# Patient Record
Sex: Male | Born: 1950
Health system: Southern US, Community
[De-identification: ages and names within clinical notes are randomized; demographics above are authoritative.]

## PROBLEM LIST (undated history)

## (undated) DIAGNOSIS — N189 Chronic kidney disease, unspecified: Secondary | ICD-10-CM

## (undated) DIAGNOSIS — I4891 Unspecified atrial fibrillation: Secondary | ICD-10-CM

## (undated) DIAGNOSIS — F32A Depression, unspecified: Secondary | ICD-10-CM

## (undated) DIAGNOSIS — M069 Rheumatoid arthritis, unspecified: Secondary | ICD-10-CM

## (undated) DIAGNOSIS — G250 Essential tremor: Secondary | ICD-10-CM

## (undated) DIAGNOSIS — E785 Hyperlipidemia, unspecified: Secondary | ICD-10-CM

## (undated) DIAGNOSIS — R002 Palpitations: Secondary | ICD-10-CM

## (undated) DIAGNOSIS — G4730 Sleep apnea, unspecified: Secondary | ICD-10-CM

## (undated) DIAGNOSIS — R7989 Other specified abnormal findings of blood chemistry: Secondary | ICD-10-CM

## (undated) DIAGNOSIS — I10 Essential (primary) hypertension: Secondary | ICD-10-CM

## (undated) DIAGNOSIS — I2510 Atherosclerotic heart disease of native coronary artery without angina pectoris: Secondary | ICD-10-CM

## (undated) DIAGNOSIS — F419 Anxiety disorder, unspecified: Secondary | ICD-10-CM

## (undated) DIAGNOSIS — C61 Malignant neoplasm of prostate: Secondary | ICD-10-CM

## (undated) DIAGNOSIS — M1990 Unspecified osteoarthritis, unspecified site: Secondary | ICD-10-CM

## (undated) DIAGNOSIS — F329 Major depressive disorder, single episode, unspecified: Secondary | ICD-10-CM

## (undated) DIAGNOSIS — K219 Gastro-esophageal reflux disease without esophagitis: Secondary | ICD-10-CM

## (undated) DIAGNOSIS — Z87442 Personal history of urinary calculi: Secondary | ICD-10-CM

## (undated) HISTORY — PX: TONSILLECTOMY: SUR1361

## (undated) HISTORY — DX: Rheumatoid arthritis, unspecified: M06.9

## (undated) HISTORY — DX: Atherosclerotic heart disease of native coronary artery without angina pectoris: I25.10

## (undated) HISTORY — DX: Hyperlipidemia, unspecified: E78.5

## (undated) HISTORY — DX: Unspecified atrial fibrillation: I48.91

## (undated) HISTORY — DX: Essential (primary) hypertension: I10

## (undated) HISTORY — DX: Depression, unspecified: F32.A

## (undated) HISTORY — DX: Major depressive disorder, single episode, unspecified: F32.9

## (undated) HISTORY — DX: Palpitations: R00.2

## (undated) HISTORY — PX: KIDNEY STONE SURGERY: SHX686

## (undated) HISTORY — PX: LITHOTRIPSY: SUR834

## (undated) HISTORY — DX: Other specified abnormal findings of blood chemistry: R79.89

---

## 1999-09-26 ENCOUNTER — Inpatient Hospital Stay (HOSPITAL_COMMUNITY): Admission: AD | Admit: 1999-09-26 | Discharge: 1999-09-27 | Payer: Self-pay | Admitting: Cardiovascular Disease

## 2000-03-10 ENCOUNTER — Ambulatory Visit (HOSPITAL_COMMUNITY): Admission: RE | Admit: 2000-03-10 | Discharge: 2000-03-10 | Payer: Self-pay | Admitting: *Deleted

## 2000-03-17 ENCOUNTER — Encounter: Payer: Self-pay | Admitting: Orthopedic Surgery

## 2000-03-17 ENCOUNTER — Ambulatory Visit (HOSPITAL_COMMUNITY): Admission: RE | Admit: 2000-03-17 | Discharge: 2000-03-17 | Payer: Self-pay | Admitting: Orthopedic Surgery

## 2000-04-01 ENCOUNTER — Encounter: Payer: Self-pay | Admitting: Orthopedic Surgery

## 2000-04-01 ENCOUNTER — Ambulatory Visit (HOSPITAL_COMMUNITY): Admission: RE | Admit: 2000-04-01 | Discharge: 2000-04-01 | Payer: Self-pay | Admitting: Orthopedic Surgery

## 2000-04-16 ENCOUNTER — Ambulatory Visit (HOSPITAL_COMMUNITY): Admission: RE | Admit: 2000-04-16 | Discharge: 2000-04-16 | Payer: Self-pay | Admitting: Orthopedic Surgery

## 2000-04-16 ENCOUNTER — Encounter: Payer: Self-pay | Admitting: Orthopedic Surgery

## 2002-10-17 ENCOUNTER — Encounter: Payer: Self-pay | Admitting: Orthopedic Surgery

## 2002-10-17 ENCOUNTER — Inpatient Hospital Stay (HOSPITAL_COMMUNITY): Admission: EM | Admit: 2002-10-17 | Discharge: 2002-10-22 | Payer: Self-pay | Admitting: Orthopedic Surgery

## 2002-10-19 ENCOUNTER — Encounter: Payer: Self-pay | Admitting: Orthopedic Surgery

## 2002-10-20 ENCOUNTER — Encounter: Payer: Self-pay | Admitting: Orthopedic Surgery

## 2002-10-21 ENCOUNTER — Encounter: Admission: RE | Admit: 2002-10-21 | Discharge: 2002-10-21 | Payer: Self-pay | Admitting: Orthopedic Surgery

## 2002-10-21 ENCOUNTER — Encounter: Payer: Self-pay | Admitting: Orthopedic Surgery

## 2002-11-10 ENCOUNTER — Inpatient Hospital Stay (HOSPITAL_COMMUNITY): Admission: RE | Admit: 2002-11-10 | Discharge: 2002-11-11 | Payer: Self-pay | Admitting: Orthopaedic Surgery

## 2002-11-10 ENCOUNTER — Encounter: Payer: Self-pay | Admitting: Orthopaedic Surgery

## 2003-12-23 HISTORY — PX: BACK SURGERY: SHX140

## 2003-12-23 HISTORY — PX: CERVICAL FUSION: SHX112

## 2004-05-13 ENCOUNTER — Inpatient Hospital Stay (HOSPITAL_COMMUNITY): Admission: EM | Admit: 2004-05-13 | Discharge: 2004-05-14 | Payer: Self-pay | Admitting: Emergency Medicine

## 2004-05-14 HISTORY — PX: CARDIAC CATHETERIZATION: SHX172

## 2005-12-24 ENCOUNTER — Inpatient Hospital Stay (HOSPITAL_COMMUNITY): Admission: RE | Admit: 2005-12-24 | Discharge: 2005-12-28 | Payer: Self-pay | Admitting: Orthopaedic Surgery

## 2006-09-18 ENCOUNTER — Ambulatory Visit (HOSPITAL_COMMUNITY): Admission: RE | Admit: 2006-09-18 | Discharge: 2006-09-18 | Payer: Self-pay | Admitting: *Deleted

## 2006-12-21 ENCOUNTER — Encounter: Admission: RE | Admit: 2006-12-21 | Discharge: 2006-12-21 | Payer: Self-pay | Admitting: Orthopaedic Surgery

## 2007-02-10 ENCOUNTER — Encounter: Admission: RE | Admit: 2007-02-10 | Discharge: 2007-02-10 | Payer: Self-pay | Admitting: Orthopaedic Surgery

## 2007-04-16 ENCOUNTER — Encounter: Admission: RE | Admit: 2007-04-16 | Discharge: 2007-04-16 | Payer: Self-pay | Admitting: Orthopaedic Surgery

## 2007-10-04 ENCOUNTER — Ambulatory Visit (HOSPITAL_BASED_OUTPATIENT_CLINIC_OR_DEPARTMENT_OTHER): Admission: RE | Admit: 2007-10-04 | Discharge: 2007-10-04 | Payer: Self-pay | Admitting: Urology

## 2008-03-29 HISTORY — PX: TRANSTHORACIC ECHOCARDIOGRAM: SHX275

## 2008-12-22 HISTORY — PX: BACK SURGERY: SHX140

## 2009-09-19 HISTORY — PX: CARDIOVASCULAR STRESS TEST: SHX262

## 2011-01-12 ENCOUNTER — Encounter: Payer: Self-pay | Admitting: Orthopaedic Surgery

## 2011-03-07 ENCOUNTER — Ambulatory Visit: Payer: Self-pay | Admitting: Urology

## 2011-05-06 NOTE — Op Note (Signed)
NAME:  Nathan Mcclure, Nathan Mcclure              ACCOUNT NO.:  [id number]   MEDICAL RECORD NO.:  [id number]          PATIENT TYPE:  AMB   LOCATION:  [facility]                         FACILITY:  [facility]   PHYSICIAN:  Excell Seltzer. Annabell Howells, M.D.    DATE OF BIRTH:  [birth year]   DATE OF PROCEDURE:  10/04/2007  DATE OF DISCHARGE:                               OPERATIVE REPORT   REDICTATION:   PROCEDURE:  1. Cystoscopy.  2. Left ureteroscopic stone extraction with holmium lasertripsy and      insertion of left double-J stent.   PREOPERATIVE DIAGNOSIS:  Left mid ureteral stone.   POSTOPERATIVE DIAGNOSIS:  Left mid ureteral stone.   SURGEON:  Bjorn Pippin, MD   ANESTHESIA:  General.   SPECIMEN:  Stone fragments.   DRAIN:  A 6-French x 26-cm double-J stent.   COMPLICATIONS:  None.   INDICATIONS:  [name] is a 60 year old white male with a left mid  ureteral stone; he has elected ureteroscopy.   FINDINGS AND PROCEDURE:  The patient is given Cipro.  He was taken to  the operating room, where a general anesthetic was induced.  He was  placed in the lithotomy position.  His perineum and genitalia were  prepped with Betadine solution and he was draped in the usual sterile  fashion.  Cystoscopy was performed using a 22-French scope and 12- and  70-degrees lenses.  Examination revealed a normal urethra.  The prostate  had trilobar hyperplasia with some obstruction.  The prostatic urethra  was fairly short.  Examination of bladder revealed mild trabeculation,  no tumor, stones or inflammation were noted.  The ureteral orifices were  unremarkable.   The left ureteral was orifice was cannulated with a guidewire, which was  passed by the stone to the kidney.  A 12-French introducer sheath  dilator was passed over the wire to dilate the distal ureter and a 6-  French short ureteroscope was then passed alongside the wire up the  ureter.  At the vessels, I had to pass a wire through the ureteroscope  to negotiate it  over the vessels and once this was done, the stone could  be seen.  It was then engaged with a holmium laser using the 270 micron  fiber on 5 joules and 8 MHz.  The stone fragmented easily and was then  removed with a nitinol basket.  Once all fragments had been removed to  the bladder, the ureteroscope was removed.  The cystoscope was  reinserted over the wire and a 6-French 26-cm double-J stent with string  was passed over the wire to the kidney under fluoroscopic guidance.  The  wire was removed, leaving a good coil in the kidney, a good coil in the  bladder.  The bladder was then evacuated free of stone fragments.  The  patient was taken down from the  lithotomy position.  The stent string was secured to his penis.  His  anesthetic was reversed.  He was moved to the recovery room in stable  condition.  There no complications.  The stone fragments were given to  the family  and they will return them to my office for analysis.      Excell Seltzer. Annabell Howells, M.D.  Electronically Signed     JJW/MEDQ  D:  10/05/2007  T:  10/06/2007  Job:  [id number]

## 2011-05-06 NOTE — Op Note (Signed)
NAME:  Nathan Mcclure, Nathan Mcclure              ACCOUNT NO.:  [id number]   MEDICAL RECORD NO.:  [id number]          PATIENT TYPE:  AMB   LOCATION:  [facility]                         FACILITY:  [facility]   PHYSICIAN:  Excell Seltzer. Annabell Howells, M.D.    DATE OF BIRTH:  1951-06-20   DATE OF PROCEDURE:  10/04/2007  DATE OF DISCHARGE:                               OPERATIVE REPORT   PROCEDURE:  Cystoscopy, left ureteroscopic stone extraction with Holmium  laser lithotripsy and insertion of left double-J stent.   PREOPERATIVE DIAGNOSIS:  Left mid-ureteral stent.   POSTOPERATIVE DIAGNOSIS:  Left mid-ureteral stent.   SURGEON:  Excell Seltzer. Annabell Howells, M.D.   ANESTHESIA:  General anesthesia.   DRAINS:  A 6-French x 26 cm double-J stent.   SPECIMENS:  Stone fragments.   COMPLICATIONS:  None.   INDICATIONS FOR PROCEDURE:  Mr. Horen is a 60 year old white male with  a 6 mm left mid-ureteral stent that __________   DESCRIPTION OF PROCEDURE/FINDINGS:  He was given Cipro.  He was taken to  the operating room where a general anesthetic was induced.  He was  placed in the lithotomy position.  His perineum and genitalia were  prepped with Betadine solution and he was draped in the usual sterile  fashion.  A cystoscopy was performed using a 22-French scope and 12 cm  degree lenses.  Examination revealed a normal urethra.  The external   Dictation ended at this point.      Excell Seltzer. Annabell Howells, M.D.     JJW/MEDQ  D:  10/04/2007  T:  10/05/2007  Job:  [id number]

## 2011-05-06 NOTE — Op Note (Signed)
NAME:  Nathan Mcclure, Nathan Mcclure              ACCOUNT NO.:  [id number]   MEDICAL RECORD NO.:  [id number]          PATIENT TYPE:  AMB   LOCATION:  [facility]                         FACILITY:  [facility]   PHYSICIAN:  Excell Seltzer. Annabell Howells, M.D.    DATE OF BIRTH:  1951/02/25   DATE OF PROCEDURE:  10/04/2007  DATE OF DISCHARGE:                               OPERATIVE REPORT   PROCEDURE:  1. Cystoscopy.  2. Left ureteroscopic stone extraction with holmium lasertripsy.  3. Insertion of left double-J stent.   PREOPERATIVE DIAGNOSIS:  Left mid ureteral stone.   POSTOPERATIVE DIAGNOSIS:  Left mid ureteral stone.   SURGEON:  Dr. Bjorn Pippin   ANESTHESIA:  General.   SPECIMENS:  Stone fragments.   DRAINS:  6 French x 26 cm double-J stent.   COMPLICATIONS:  None.   INDICATIONS:  Mr. [name] is a 60 year old white male with a 6 mm left  mid ureteral stone that has not progressed.  He has elected  ureteroscopic stone extraction.   FINDINGS AT PROCEDURE:  The patient was taken to the operating room  where general anesthetic was induced.  He was given Cipro.   Dictation ended at this point.      Excell Seltzer. Annabell Howells, M.D.  Electronically Signed     JJW/MEDQ  D:  10/04/2007  T:  10/05/2007  Job:  [id number]

## 2011-05-09 NOTE — H&P (Signed)
NAME:  Nathan Mcclure, Nathan Mcclure NO.:  [id number]   MEDICAL RECORD NO.:  [id number]          PATIENT TYPE:  INP   LOCATION:  NA                           FACILITY:  [facility]   PHYSICIAN:  Sharolyn Douglas, M.D.        DATE OF BIRTH:  Oct 31, 1951   DATE OF ADMISSION:  12/24/2005  DATE OF DISCHARGE:                                HISTORY & PHYSICAL   NOTATION:  This history and physical was performed in our office on December 18, 2005.   CHIEF COMPLAINT:  Pain in my back and legs.   HISTORY OF PRESENT ILLNESS:  His 60 year old white male is seen by [place] for  continuing progressive problems concerning pain in his back and lower  extremities, worse on the right than on the left.  He has had increasing  pain over the last several months, to the point now where he has difficulty  moving about, and certainly is made worse by standing or walking.  This is a  very active young gentleman, and this pain has markedly interfered with his  day to day activities.  He has had a run of epidural steroid injections by  Dr. Caralyn Guile. Ramos, but unfortunately this has not helped him as far as  his overall discomfort is concerned.  An MRI showed lateral recess stenosis  on the right L4-5 with a left-sided foraminal narrowing at L5-S1.  This  patient is quite exasperated with his overall pain and discomfort and after  much discussion, including the risks and benefits of surgery, he is being  admitted for a posterior spinal fusion at L4 to S1.   PAST MEDICAL HISTORY:  1.  The patient has been cleared preoperatively and has undergone a heart      clearance by Eber Hong, P.A.-C. with Dr. Nicki Guadalajara.  He      does have hypertension.  2.  Reflux.  3.  Benign prostatic hypertrophy.   ALLERGIES:  PENICILLIN.   CURRENT MEDICATIONS:  1.  Atenolol.  2.  Prevacid.  3.  Altace.  4.  Caduet.  5.  Flomax.  6.  Allegra.   PAST SURGICAL HISTORY:  Cervical diskectomy and fusion in 2004, by Dr.  Sharolyn Douglas.   FAMILY HISTORY:  Positive for diabetes in his mother.   SOCIAL HISTORY:  The patient is married.  He is employed in [employer].  He  quit smoking 18 months ago.  Alcoholic beverages:  Three to four per week.  He has two children.  He has a two-story home with one flight of stairs.   REVIEW OF SYSTEMS:  CNS:  No seizures, paralysis or double vision, but the  patient does have radiculitis in the lower extremities as mentioned above.  CARDIOVASCULAR:  No chest pain, no aneurysm, no orthopnea.  GASTROINTESTINAL:  No nausea, vomiting, melena or bloody stools.  GENITOURINARY:  No discharge, dysuria, hematuria.  MUSCULOSKELETAL:  Primarily in the history of present illness.   PHYSICAL EXAMINATION:  GENERAL:  Alert, cooperative and friendly, somewhat  anxious 60 year old who changes position frequently during the 30+  minute  office visit, as well as the examination.  VITAL SIGNS:  Blood pressure 140/78, pulse 62, respirations 12.  HEENT:  Normocephalic.  Pupils equal, round, reactive to light and  accommodation.  EOM's intact.  Oropharynx clear.  CHEST:  Clear to auscultation with no rhonchi, no rales, no wheezes.  HEART:  Regular rate and rhythm.  No murmurs are heard.  ABDOMEN:  Soft, nontender.  Liver and spleen not felt.  GENITOURINARY/RECTAL:  Not done, not pertinent to the present illness.  EXTREMITIES/NEUROLOGIC:  Negative for straight leg raise bilaterally.  Some  sensory deficit, more so on the right than on the left in the L5-S1 nerve  root distribution.  Negative clonus.  Strength is normal.  Deep tendon  reflexes 2+ and equal.   ADMISSION DIAGNOSES:  1.  L4 to S1 degenerative disk disease with stenosis.  2.  Hypertension.  3.  Reflux.  4.  Benign prostatic hypertrophy.   PLAN:  The patient will undergo an L4 to S1 posterior spinal fusion with  laminectomy, utilizing pedicle screws, trans-foraminal lumbar interbody  fusion with allograft and bone morphogenic  protein.      Dooley L. Cherlynn June.      Sharolyn Douglas, M.D.  Electronically Signed    DLU/MEDQ  D:  12/19/2005  T:  12/19/2005  Job:  [id number]

## 2011-05-09 NOTE — Op Note (Signed)
NAME:  Nathan Mcclure, Nathan Mcclure                         ACCOUNT NO.:  [id number]   MEDICAL RECORD NO.:  [id number]                   PATIENT TYPE:  INP   LOCATION:  2550                                 FACILITY:  [facility]   PHYSICIAN:  Sharolyn Douglas, M.D.                     DATE OF BIRTH:  [birth year]   DATE OF PROCEDURE:  11/10/2002  DATE OF DISCHARGE:                                 OPERATIVE REPORT   DIAGNOSIS:  Right C7 radiculopathy secondary to spondylosis and herniated  nucleus pulposus.   PROCEDURES:  1. Anterior cervical diskectomy, C6-7 with decompression of the spinal cord     and nerve roots bilaterally.  2. Anterior cervical arthrodesis with placement of an 8 mm VG2C allograft     prosthesis spacer.  3. Anterior cervical instrumentation utilizing the EBI VueLok plate Z6-1.  4. Neuromonitoring utilizing SSEP's.   SURGEON:  Patricia Nettle, M.D.   ASSISTANT:  Colleen Mahar, P.A.-C.   ANESTHESIA:  General endotracheal.   COMPLICATIONS:  None.   INDICATIONS:  The patient is a 60 year old, male with a one month history of  progressively worsening neck and right upper extremity pain.  He was  initially seen by Windy Fast A. Gioffre, M.D. and had such extreme pain in his  neck and arm with associated weakness that he required admission to the  hospital and narcotic pain medications intravenously.  He had to be sedated  in order to obtain an MRI scan which showed spondylosis at C6-7 with  foraminal disk herniation on the right.  His physical examination was  consistent with a right C7 radiculopathy.  He had a right C7 selective nerve  root injection which gave him some improvement in his symptoms.  Unfortunately, he has not regained any strength and has persistent numbness  and pain radiating in a C7 distribution on the right side.  Risks, benefits,  and alternatives of surgery were reviewed in detail with the patient, and he  elected to proceed.   DESCRIPTION OF PROCEDURE:  The patient  was properly identified in the  holding area and taken to the operating room.  He underwent general  endotracheal anesthesia without difficulty.  Neuromonitoring was established  in the form of SSEP's.  He was carefully positioned on the operating room  table with the horseshoe head rest and his neck in slight hyperextension.  Five pounds of Holter traction was hung.  The neck was prepped and draped in  the usual sterile fashion.  His shoulders were gently taped down to allow  for adequate x-ray visualization of the C6-7 interspace.  A 3 cm transverse  incision was then made at the level of the cricoid cartilage transversely on  the left side of the neck.  Previous to the injection, 10 cc of 1% lidocaine  with epinephrine was injected subcutaneously.  Dissection was carried down  sharply through the platysma.  The interval between the sternocleidomastoid  muscle and strap muscles medially was identified and developed down to the  prevertebral space.  The esophagus, trachea, and carotid sheath were  identified and protected at all times.  The C6-7 disk space was identified  by anterior osteophytic overgrowth.  A spinal needle was placed in the level  above at C5-6 in order to allow for x-ray visualization.  Intraoperative x-  ray confirmed our needle was in C5-6.  We then elevated the longus colli  muscle over the C6-7 disk space bilaterally.  Care was taken to protect the  disk above and below.  We placed our deep retractors.  Leksell rongeur was  then used to remove the large anterior osteophytes.  We placed Caspar  distraction pins, and gentle distraction was applied.  Curettes and  pituitary rongeurs were used to perform a radical diskectomy back to the  PLL.  The disk material was degenerative.  We found large uncovertebral  spurs, right greater than left.  A high-speed bur was then used to remove  the cartilaginous endplates as well as to take down the large uncovertebral  spur on the  right side.  The surgical microscope was draped, brought into  the field.  The rest of the case was done under direct microscopic  illumination and magnification.  We used micro Kerrisons and curettes to  completely take down the uncovertebral spur on the right side.  We  identified the C7 pedicle with a nerve hook.  We performed a foraminotomy  out the C6-7 foramen.  We identified the C7 nerve root as it was coming back  and out the foramen.  We confirmed that it was completely free all the way  out the foramen with a blunt probe.  We performed a similar decompression on  the left side, although the spur was not nearly as large.  We took down the  PLL on the right side and confirmed that there were no free fragments of  disk by sweeping with a blunt probe.  The wound was copiously irrigated.  We  then placed an 8 mm VG2C allograft prosthesis spacer within the interspace.  The distraction was removed.  Weight was removed from the head, so we had an  excellent interference fit.  We then placed a 20 mm EBI VueLok plate with  four 14 mm screws.  We engaged the locking mechanism.  We took  intraoperative x-rays for good position of the bone plug in plate.  We then  irrigated again.  All bleeding was controlled with Gelfoam, bipolar  electrocautery.  Platysma was closed with an interrupted 2-0 Vicryl suture.  We placed a deep Penrose drain.  Subcutaneous layer closed with 3-0 and then  subcuticular layer closed with 4-0 Vicryl.  Benzoin and Steri-Strips were  placed, sterile dressing applied.  The patient was placed into an Aspen  collar, extubated without difficulty, transferred to the recovery room in  stable condition, able to move his upper and lower extremities.  There were  no dilatoris changes in the SSEP monitoring throughout the procedure.                                               Sharolyn Douglas, M.D.   MC/MEDQ  D:  11/10/2002  T:  11/10/2002  Job:  [id number]

## 2011-05-09 NOTE — Discharge Summary (Signed)
NAME:  Nathan Mcclure, Nathan Mcclure                         ACCOUNT NO.:  [id number]   MEDICAL RECORD NO.:  [id number]                   PATIENT TYPE:  INP   LOCATION:  0343                                 [facility]ACILITY[facility]  [facility]   PHYSICIAN:  Sharolyn Douglas, M.D.                     DATE OF BIRTH:  1951/05/07   DATE OF ADMISSION:  10/17/2002  DATE OF DISCHARGE:  10/22/2002                                 DISCHARGE SUMMARY   ADMISSION DIAGNOSES:  1. Neck pain and right arm pain.  2. Hypertension.  3. Gastrointestinal reflux disease.  4. Coronary artery disease.  5. Kidney stones.   DISCHARGE DIAGNOSES:  1. Herniated nucleus pulposis, C6-7.  2. Status post selective nerve root block at C7 on the right with improved     pain.  3. Hypertension.  4. Gastrointestinal reflux disease.  5. Coronary artery disease.  6. Kidney stones.   CONSULTS:  None.   PROCEDURES:  Selective nerve root block on the right at C7 with improved  pain.  This was done by [facility].   HISTORY OF PRESENT ILLNESS:  The patient is a 60 year old male who has been  having progressive increase in pain in his neck, as well as in his right  upper extremity.  The is going all the way down his arm into his forearm and  into his hand.  He has been complaining of pain in his right triceps and  significant pain in his posterior arm and right forearm.  He also complains  of numbness with extension to his right arm and hand.  He denies any lower  extremity symptoms.  He denies any bladder or bowel incontinence, fever,  chills, or sweats.  He was admitted for pain control by Georges Lynch. [name],  M.D.  Secondary to the findings on MRI, Sharolyn Douglas, M.D., was consulted for  evaluation of herniated C6-7 disk.   LABORATORY DATA:  On October 17, 2002, the CBC was within normal limits with  the exception of a white count of 11.7.  The PT, INR, and PTT were within  normal limits.  The comprehensive metabolic panel was within normal  limits  with the exception of a BUN of 36.  The UA was negative.  The EKG from  October 18, 2002, showed sinus bradycardia with a rate of 41.  X-ray of the  cervical spinal spine on October 19, 2002, showed C6-7 mild disk  degeneration and bone spur formation with mild bilateral bony neuroforaminal  narrowing.  MRI did show an HNP at C6-7 with neuroforaminal narrowing more  significant on the right.   HOSPITAL COURSE:  On October 17, 2002, the patient was admitted for neck  pain and right upper extremity pain, which was severe and not responding to  p.o. pain medications.  An MRI was ordered for the following day, which did  show the above C6-7  herniated disk.  Sharolyn Douglas, M.D., was consulted for  evaluation.  We did discuss with the patient the possibility of removing  this disk versus attempting a nerve root block.  He opted to do a selective  nerve root block and attempt to alleviate symptoms and possibly get rid of  this problem.  We did discuss it likely would return, however, the patient  was interested in giving it a try and if it did not give him any significant  relief of symptoms he will follow up with [place] as an outpatient and consider  having the surgery at that point.  He was taken for selective nerve root  block on October 21, 2002.  He completed this without any complications or  problems.  By the following day, his pain had improved to the point that he  felt like he would go home.  It was not completely resolved at this point,  however, he was neurovascularly intact.  He did have some weakness  throughout his entire hospital stay in his right triceps, as well as wrist  extensors.  This did not change post injection.  Care management was  consulted to help [place] arrange any home needs for this patient.  By October 22, 2002, the patient's pain was relatively well controlled.  He was stable  and ready for discharge with discharge plan for status post right C7  selective nerve  root block with improved pain.   ACTIVITY:  Progressive ambulation.   DISCHARGE MEDICATIONS:  Continue with pain medications as needed.   FOLLOW-UP:  Follow up with Sharolyn Douglas, M.D., the following week.  He was  instructed to call for an appointment.   DIET:  Regular home diet as tolerated.   CONDITION ON DISCHARGE:  Improved.   DISPOSITION:  The patient is being discharged to his home.     [name], P.A.                       Sharolyn Douglas, M.D.    CM/MEDQ  D:  11/21/2002  T:  11/21/2002  Job:  [id number]

## 2011-05-09 NOTE — Op Note (Signed)
NAME:  Nathan Mcclure, Nathan Mcclure              ACCOUNT NO.:  [id number]   MEDICAL RECORD NO.:  [id number]          PATIENT TYPE:  INP   LOCATION:  5023                         FACILITY:  [facility]   PHYSICIAN:  Sharolyn Douglas, M.D.        DATE OF BIRTH:  December 29, 1950   DATE OF PROCEDURE:  12/24/2005  DATE OF DISCHARGE:                                 OPERATIVE REPORT   PREOPERATIVE DIAGNOSIS:  L5-S1 degenerative disk disease, chronic back and  bilateral leg pain.   PROCEDURE:  1.  L5-S1 left hemilaminectomy with decompression of the left L5-S1 nerve      roots.  2.  Transforaminal lumbar interbody fusion L5-S1 with placement of 7 mm peak      cage.  3.  Pedicle screw instrumentation L5-S1 using the Abbott spine system.  4.  Posterior spinal arthrodesis L5-S.  5.  Local autogenous bone graft supplemented with bone morphogenic protein.   SURGEON:  Sharolyn Douglas, M.D.   ASSISTANT:  [name], P.A.   ANESTHESIA:  General endotracheal.   ESTIMATED BLOOD LOSS:  200 mL.   COMPLICATIONS:  None.   INDICATIONS:  The patient is a pleasant 60 year old male with chronic  progressive back and bilateral leg pain. His imaging studies demonstrate  multilevel degenerative changes, most advanced at L5-S1 with near-complete  disk space collapse and Modic endplate changes.  He failed to respond to all  conservative treatment options and now elected to undergo L5-S1  decompression and fusion in hopes of improving his symptoms.  Risks,  benefits, alternatives reviewed.   DESCRIPTION OF PROCEDURE:  The patient was identified in the holding area,  taken to the operating room.  He underwent general endotracheal anesthesia  without difficulty and given prophylactic IV antibiotics.  Carefully  positioned prone on the Wilson frame.  Neuro monitoring was established in  the form of SSEPs and lower extremity EMGs.  The back was prepped and  draped in the usual sterile fashion.  A midline incision was made from L4  down to  the sacrum.  Dissection carried sharply through deep fascia.  Subperiosteal exposure carried out to the tips the transverse process of L5  and the sacral ala bilaterally.  Deep retractors were placed.  Intraoperative x-ray taken to confirm levels.  We then turned our attention  to placing pedicle screws at L5-S1 using anatomic probing technique.  We  utilized 6.5 x 50 mm screws in L5 and 7.5 by a 40 mm screws in the sacrum  bilaterally.  We had good screw purchase.  We were unable to stimulate the  pedicle screws because of a technical problem with the neuro monitoring.  We  did palpate the pedicles, and there were no breeches.  We then turned our  attention to performing a laminectomy at L5-S1 on the left side.  The  interspinous ligament was removed.  A hemilaminectomy was completed.  The  ligamentum of flavum was removed piecemeal. The L5-S1 nerve roots were  identified and decompressed.  We then turned our attention to forming a  transforaminal lumbar interbody fusion.  The remaining facet joint  was  osteotomized.  The exiting and transversing nerve roots were identified and  protected all times.  The disk space was covered by a large osteophyte.  This was removed with an osteotome.  We were then able to enter the disk  space and gradually dilated up to 7 mm.  The disk material was extremely  degenerative. We scraped the cartilaginous endplates clean.  We then packed  the disk space with BMP sponges and local bone graft obtained from the  laminectomy. A 7-mm peak cage was then packed with a BMP sponge inserted  into the interspace and carefully tamped anteriorly and across the midline.  We then completed the posterior spinal arthrodesis by decorticating the  transverse process of L5, the sacral ala bilaterally.  The remaining local  bone graft was packed into lateral gutters.  The 50 mm short segment  titanium rods were placed into the polyaxial screw heads. Nathan Mcclure compression  was  applied before shearing off the locking caps.  The wound was irrigated.  Hemostasis was achieved.  Deep Hemovac drain left in place.  The deep fascia  closed with a running #1 Vicryl suture.  Subcutaneous layer closed with 2-0  Vicryl suture followed by a running 3-0 subcuticular Vicryl suture on the  skin edges,.  Benzoin and Steri-Strips placed. Sterile dressing applied.  Patient was turned supine, extubated without difficulty and transferred to  recovery in stable condition, able to move his upper and lower extremities.      Sharolyn Douglas, M.D.  Electronically Signed     MC/MEDQ  D:  12/24/2005  T:  12/25/2005  Job:  [id number]

## 2011-05-09 NOTE — Cardiovascular Report (Signed)
NAME:  Nathan Mcclure, Nathan Mcclure                         ACCOUNT NO.:  [id number]   MEDICAL RECORD NO.:  [id number]                   PATIENT TYPE:  INP   LOCATION:  2030                                 FACILITY:  [facility]   PHYSICIAN:  Cristy Hilts. Jacinto Halim, M.D.                  DATE OF BIRTH:  12/19/1951   DATE OF PROCEDURE:  05/14/2004  DATE OF DISCHARGE:                              CARDIAC CATHETERIZATION   PROCEDURE PERFORMED:  1. Left ventriculography.  2. Selective right and left coronary angiography.  3. Abdominal aortogram.  4. Right femoral angiography and closure of the right femoral arterial     access with Angioseal.   INDICATIONS:  [name] is a 60 year old Caucasian male with  history of hypertension, hyperlipidemia, continued tobacco abuse, and  history of known mild coronary artery disease by cardiac catheterization on  September 27, 1999. At that time was found to have a diagonal 60 to 70%  stenosis. Now he presents here with frequent chest pain suggestive of  unstable angina. Abdominal aortogram was performed to evaluate for abdominal  atherosclerosis.   HEMODYNAMIC DATA:  The left ventricular pressure 105/4 with end-diastolic  pressure of 14 mmHg. The aortic pressures were 100/64 with a mean of 81  mmHg.   ANGIOGRAPHIC DATA:  1. Left ventricle. Left ventricular function was normal. Ejection fraction     was estimated at 55%. There was no wall motion abnormality. There was no     significant mitral regurgitation.  2. Right coronary artery. Right coronary artery is a very large caliber     vessel. It measures at least  4-5 mm in diameter. There is a 30% mid stenosis, and gives origin to a large  PDA which has a hazy 30, at most 40% stenosis. This appears like a napkin-  ring lesion.  1. Left main coronary artery.  Left main coronary artery is a large caliber     vessel. There is a focal 10 to 20% luminal irregularity.  2. Circumflex. The circumflex is a small vessel. It  is normal.  3. Ramus intermedius. Ramus intermedius is at moderate caliber vessel.     Ostium has mild luminal irregularity construing 10% stenosis.  4. Left anterior descending artery.  Left anterior descending artery is a     large caliber vessel. It is origin to a moderate to large diagonal I. The     diagonal I has mid 20% stenosis which his smooth, and the LAD after the     origin of the diagonal has a long segment intermyocardial bridging. The     same site also had maybe 20, at most 30% luminal narrowing. The LAD wraps     around the apex.  5. Abdominal aortogram. Abdominal aortogram revealed no evidence of     abdominal aortic aneurysm. There were two enlargements, one on either     side. They are widely  patent.   IMPRESSION:  Noncritical coronary artery disease. These noncritical lesions,  especially the RCA which appears to be hazy with 30% stenosis in its PDA  branch and also the ramus branch which has ostial 10% stenosis, could have  contributed to his unstable angina.   RECOMMENDATIONS:  Based on the coronary anatomy, continued medical therapy  is advised. Smoking cessation and aggressive therapy with lipid lowering  along with Plavix for six months to 9 months is indicated. The patient will  be discharged home today and follow up with Daphene Jaeger in about two weeks, if  he is chest pain free.   TECHNIQUE OF PROCEDURE:  Under usual sterile technique using a 6 French  right femoral access, a 6 [place] Multipurpose B2 catheter was advanced from  the ascending aorta over a 0.035-inch J wire.  The catheter was gently  advanced to the left ventricle and left ventricular pressure monitored.  Hand contrast injection of the left ventricle was performed both in the LAO  and RAO projection. The catheter was flushed with saline and pulled back  into the ascending aorta.  Pressure gradient across the aortic valve was  monitored. Right coronary artery was selectively engaged and  angiography was  performed. In a similar fashion, left main coronary selectively engaged and  angiography was performed. The catheter was pulled back into the abdominal  aorta, and abdominal aortogram was performed. Then the catheter was pulled  out of the body in the usual fashion. Right coronary angiography was  performed with arterial access sheath, and the access was closed with 6-  [place] Angioseal with excellent hemostasis obtained, and the patient was  transferred to the recovery in a stable condition. The patient tolerated the  procedure well.                                               Cristy Hilts. Jacinto Halim, M.D.    Pilar Plate  D:  05/14/2004  T:  05/15/2004  Job:  [id number]

## 2011-05-09 NOTE — H&P (Signed)
NAME:  Nathan Mcclure, Nathan Mcclure                           ACCOUNT NO.:  [id number]   MEDICAL RECORD NO.:  [id number]                   PATIENT TYPE:   LOCATION:                                       FACILITY:   PHYSICIAN:  Sharolyn Douglas, M.D.                     DATE OF BIRTH:  1951/08/04   DATE OF ADMISSION:  11/10/2002  DATE OF DISCHARGE:                                HISTORY & PHYSICAL   CHIEF COMPLAINT:  Neck and right upper extremity pain as well as numbness  and weakness.   HISTORY OF PRESENT ILLNESS:  The patient is a 60 year old male with neck and  right upper extremity pain.  He was actually admitted to hospital a few  weeks back secondary to this excruciating pain.  He was found to have a  herniated disk at C6-7 which was putting pressure on the C7 nerve root on  the right.  He has failed conservative management including analgesics, anti-  inflammatory medications, as well as nerve root blocks.  The pain has gotten  to the point that it is near constant and it is affecting his life and  severe in nature.  The risks and benefits of surgery were discussed with the  patient.  He indicated understanding and did opt to proceed.   ALLERGIES:  1. PENICILLIN (causes an unknown reaction as a child).  2. MORPHINE (unknown reaction).   MEDICATIONS:  1. Atenolol q.d.  2. Norvasc q.d.  3. Altace q.d.  4. Vitamin E.  5. Aspirin 81 mg q.d.  6. Vioxx 25 mg p.o. q.d.  7. Percocet p.r.n.  8. Flomax q.d.  9. Claritin p.r.n.  10.      Lipitor q.d.  The patient is unaware of his dosages.  He will bring those with him to the  hospital.   PAST MEDICAL HISTORY:  1. Hypertension.  2. Coronary artery disease.  3. Hypercholesterolemia.  4. Seasonal allergies.  5. Has difficulty with anesthesia.   PAST SURGICAL HISTORY:  Kidney stone removal in 1978.   SOCIAL HISTORY:  The patient smokes 1/2 pack of cigarettes per day.  Denies  alcohol use.  He does have family in the area that will be  available to help  him postoperatively.   FAMILY MEDICAL HISTORY:  Mother alive at age 36 with coronary artery  disease, atrial fibrillation and diabetes mellitus.  Father alive at age 23  with prostate cancer.   REVIEW OF SYSTEMS:  The patient denies any fevers, chills, sweats, bleeding,  paresthesias.  CNS; no history of blurred vision, double vision, seizures,  headache, paralysis.  CARDIOVASCULAR:  Denies chest pain, angina, orthopnea,  claudication or palpitations.  PULMONARY:  No shortness of breath or  hemoptysis.  GI:  Denies nausea, vomiting, constipation, diarrhea, melena or  bloody stools.  GU:  Denies dysuria or hematuria, discharge.  MUSCULOSKELETAL:  As per HPI.  PHYSICAL EXAMINATION:  VITAL SIGNS:  Blood pressure 124/80, respirations 16  unlabored, pulses 58 and regular.  GENERAL APPEARANCE:  The patient is a 60 year old white male who is alert  and oriented and in no acute distress.  Well-nourished, well-groomed,  appears stated age.  Pleasant and cooperative to examination.  HEENT:  Head is normocephalic and atraumatic.  Pupils equal, round and  reactive.  Extraocular movements intact.  Nares patent.  Sclerae clear.  NECK:  Soft to palpation, but no bruits appreciated.  No lymphadenopathy or  thyromegaly noted.  CHEST:  Clear to auscultation bilaterally.  No rales, rhonchi, strider or  wheezes, friction rubs.  BREASTS:  Not performed.  HEART:  S1, S2 regular rate and rhythm, no murmurs, gallops or rubs noted.  ABDOMEN:  Soft to palpation, positive bowel sounds, nontender, nondistended,  no organomegaly noted.  GU:  Not pertinent, not performed.  EXTREMITIES:  He is neurovascularly grossly intact.  Pulses are intact and  equal.  Right upper extremity pain.  He does have some weakness as outlined  in the office note.  SKIN:  Intact with no inflammation or rashes.   LABORATORY DATA:  MRI shows HNP at C6-7.   IMPRESSION:  1. Cervical herniated nucleus pulposus.   2. Coronary artery disease.  3. Hypertension.  4. Hypercholesterolemia.   PLAN:  1. Admit to [facility] on 11/10/[facility] for anterior cervical diskectomy     and fusion at C6-7 to be done by Dr. Sharolyn Douglas.  2. The patient's cardiologist is Dr. Allyson Sabal and Dr. Tresa Endo, and they have     signed a note indicating that the patient's planned surgery was low     cardiovascular risk.  We will certainly contact them should any     postoperative complications arise.     [name], P.A.                       Sharolyn Douglas, M.D.    CM/MEDQ  D:  11/10/2002  T:  11/10/2002  Job:  [id number]

## 2011-05-09 NOTE — Discharge Summary (Signed)
Nathan Mcclure, Nathan Mcclure              ACCOUNT NO.:  [id number]   MEDICAL RECORD NO.:  [id number]          PATIENT TYPE:  INP   LOCATION:  5023                         FACILITY:  [facility]   PHYSICIAN:  Sharolyn Douglas, M.D.        DATE OF BIRTH:  1951/05/11   DATE OF ADMISSION:  12/24/2005  DATE OF DISCHARGE:  12/28/2005                                 DISCHARGE SUMMARY   ADMISSION DIAGNOSES:  1.  L4 to S1 degenerative disk disease with stenosis.  2.  Hypertension.  3.  Reflux.  4.  Benign prostatic hypertrophy.   DISCHARGE DIAGNOSES:  1.  L4 to S1 degenerative disk disease with stenosis.  2.  Hypertension.  3.  Reflux.  4.  Benign prostatic hypertrophy.  5.  Mild postoperative anemia.   OPERATION:  On December 24, 2005, the patient underwent:   1.  L5-S1 left hemilaminectomy, decompression to the left L5-S1 nerve root.  2.  Transforaminal lumbar interbody fusion, L5-S1, with placement of 7 mm      PEEK cage.  3.  Pedicle screw instrumentation, L5 to S1, utilizing Abbott spine system.  4.  Posterior spinal arthrodesis, L5-S1.  5.  Local autogenous bone graft supplemented with bone morphogenic protein.   Nathan Mcclure, P.A., assisted.   BRIEF HISTORY:  This is a 60 year old male with continuing problems  regarding his back and radiation to his lower extremities.  He now has  difficulty moving about due to his pain and has difficulty standing or  walking.  He is a very active gentleman and after the lack of complete lack  with his epidural steroid injection or even improvement, we have discussed  surgery.  An MRI showed lateral recess stenosis at right L4-5, a left-sided  foraminal narrowing at L5-S1.  After much discussion including the risks and  benefits of surgery, it was decided that he would benefit with surgical  intervention and was scheduled for the above procedure.   COURSE IN THE HOSPITAL:  The patient tolerated the surgical procedure quite  well.  He was placed on liquid diet  early on for his early diet.  Physical  therapy was started the day after surgery.  He worked with physical therapy  and actually was doing really well at his physical therapy.  OT worked on  ADL participation and taught back precautions.  A rolling walker was given  the patient so he could achieve ambulation at home.  The wound remained  clean and dry and neurovascularly intact to the lower extremities.  He had  less leg pain.  Vital signs were stable, and it was felt he could be  maintained in the home environments so arrangements were made for discharge.   Laboratory values in the hospital showed a preoperative CBC completely  within normal limits.  Hemoglobin was 14.5, hematocrit was 41.9.  Final  hemoglobin was 11.7, hematocrit of 34.5.  Blood chemistries were normal.  Urinalysis negative for urinary tract infection.   CONDITION ON DISCHARGE:  Improved and stable.   PLAN:  The patient is discharged home with Percocet for pain, Robaxin  for  muscle relaxant, one multivitamin daily, calcium 1000 mg daily, Colace 100  mg b.i.d., laxative p.r.n., continue his home medication and diet, and no  NSAIDs.  He is to walk with assistance using a walker.  No driving for two  weeks, no lifting for 12 weeks over five pounds.  Daily dressing change.  He  will return to see [place] in our office two weeks after the date of surgery.  He  is to follow with his medical physician, Nicki Guadalajara, M.D., as indicated.      Dooley L. Cherlynn June.      Sharolyn Douglas, M.D.  Electronically Signed    DLU/MEDQ  D:  02/02/2006  T:  02/03/2006  Job:  [id number]

## 2011-05-09 NOTE — Op Note (Signed)
NAME:  Nathan Mcclure, Nathan Mcclure NO.:  [id number]   MEDICAL RECORD NO.:  [id number]          PATIENT TYPE:  AMB   LOCATION:  ENDO                         FACILITY:  [facility]   PHYSICIAN:  Georgiana Spinner, M.D.    DATE OF BIRTH:  1951-12-03   DATE OF PROCEDURE:  09/18/2006  DATE OF DISCHARGE:                                 OPERATIVE REPORT   SURGEON:  Georgiana Spinner, M.D.   PROCEDURE:  Upper endoscopy.   INDICATIONS:  GERD.   ANESTHESIA:  Fentanyl 100 mcg, Versed 9 mg.   PROCEDURE:  With the patient mildly sedated in the left lateral decubitus  position, the Olympus videoscopic endoscope was inserted in the mouth and  passed under direct vision through the esophagus, which appeared grossly  normal.  We took biopsies around the squamocolumnar junction, entered into  the stomach.  The fundus, body, antrum, duodenal bulb and second portion of  the duodenum were visualized.  From this point, the endoscope was slowly  withdrawn, taking circumferential views of the duodenal mucosa until the  endoscope was then pulled back into the stomach and placed in retroflexion  to view the stomach from below.  The endoscope was then straightened and  withdrawn, taking circumferential views of the remaining gastric and  esophageal mucosa.  The patient's vital signs and pulse oximetry remained  stable.  The patient tolerated the procedure well without apparent  complication.   FINDINGS:  Unremarkable examination with biopsies taken of the  squamocolumnar junction; await biopsy report.  The patient will call me for  results and follow up with me as an outpatient.  Proceed to colonoscopy as  planned.           ______________________________  Georgiana Spinner, M.D.     GMO/MEDQ  D:  09/18/2006  T:  09/18/2006  Job:  [id number]

## 2011-05-09 NOTE — Op Note (Signed)
NAME:  Nathan Mcclure, Nathan Mcclure NO.:  [id number]   MEDICAL RECORD NO.:  [id number]          PATIENT TYPE:  AMB   LOCATION:  ENDO                         FACILITY:  [facility]   PHYSICIAN:  Georgiana Spinner, M.D.    DATE OF BIRTH:  10/09/1951   DATE OF PROCEDURE:  09/18/2006  DATE OF DISCHARGE:                                 OPERATIVE REPORT   SURGEON:  Georgiana Spinner, M.D.   PROCEDURE:  Colonoscopy.   INDICATIONS:  Colon polyp.   ANESTHESIA:  Versed 1 mg.   DESCRIPTION OF PROCEDURE:  With the patient mildly sedated in the left  lateral decubitus position, a rectal exam was performed, which was  unremarkable.  Subsequently, the Olympus videoscopic colonoscope was  inserted into the rectum and passed under direct vision to the cecum,  identified by the ileocecal valve and base of the cecum, both of which were  photographed.  From this point, the colonoscope was slowly withdrawn, taking  circumferential views of the colonic mucosa, stopping at approximately 30 cm  from the anal verge, at which point a polyp was seen, photographed and  removed using hot biopsy forceps technique at a setting of 20/200 blended  current. The endoscope was withdrawn to the rectum, which appeared normal,  and the rectum showed hemorrhoids on retroflex view.  The endoscope was  straightened and withdrawn.  The patient's vital signs and pulse oximetry  remained stable.  The patient tolerated the procedure well and without  apparent complications.   FINDINGS:  Polyp at 30 cm from the anal verge, internal hemorrhoids,  otherwise, unremarkable colonoscopic examination to the cecum.   PLAN:  Await biopsy report.  The patient will call me for results and follow  up with me as an outpatient.           ______________________________  Georgiana Spinner, M.D.     GMO/MEDQ  D:  09/18/2006  T:  09/18/2006  Job:  [id number]

## 2011-05-09 NOTE — Discharge Summary (Signed)
NAME:  [name], [name] NO.:  [id number]   MEDICAL RECORD NO.:  [id number]                   PATIENT TYPE:  INP   LOCATION:  2030                                 FACILITY:  [facility]   PHYSICIAN:  Nicki Guadalajara, M.D.                  DATE OF BIRTH:  Dec 26, 1950   DATE OF ADMISSION:  05/13/2004  DATE OF DISCHARGE:  05/14/2004                                 DISCHARGE SUMMARY   HISTORY OF PRESENT ILLNESS:  [name] is a 61 year old white married male  patient of Nicki Guadalajara, M.D., with known coronary artery disease.  He  presented to the emergency room with complaints of chest pain pressure while  sitting at his desk in his office.  It radiated across his chest to both  shoulders.  He had numbness and tingling in both hands.  He also had nausea,  mild shortness of breath, hot flushing feeling over his head and face.  He  also complained about palpitations.  He has also noticed worsening dyspnea  on exertion the last two months.  He has a history of coronary  catheterization in October 2000 with bridging of the mid portion of his LAD  at his 70% diagonal.  Circumflex was free of lesions.  His RCA had luminal  irregularities.  His EF was 58%.  His Cardiolite last was done July 2004  which only showed apical thinning.  He was seen by Dr. Nicki Guadalajara in the  emergency room, decided to be admitted, put on IV heparin, IV nitroglycerin,  and recommended him to undergo cardiac catheterization.  This was performed  on May 14, 2004.  He had LAD 20-30%, a little bit of RCA stenosis, proximal  to mid 30%, and distal 30-40%.  He had no MR.  His EF was 55%.  His aorta  and renals were negative.  He was catheterized by Cristy Hilts. Jacinto Halim, M.D., who  recommended that he have six to nine months of Plavix.   DISCHARGE MEDICATIONS:  1. Altace 10 mg one time per day.  2. Aspirin 81 mg every day.  3. Atenolol 25 mg a day.  4. Lipitor 20 mg a day.  5. Norvasc 5 mg a day.  6. Flomax  0.4 mg every day.  7. Allegra 10 mg as needed.  8. Prevacid 30 mg one time per day.  9. Plavix 75 mg once per day for six to nine months.  10.      Nitroglycerin _________ mcg one under the tongue every five minutes     x 3 as needed for chest pain.   DISCHARGE ACTIVITIES:  He should do no strenuous activity, lifting, or  pulling x 2 days and no driving today.  He should stop smoking.   FOLLOW UP:  He will followup in Dr. Evlyn Clines office in two weeks.  Our  office will give him a call.  DISCHARGE DIAGNOSES:  1. Chest pain episode sounded significant for angina; however, he does not     have high grade stenosis at this time.  Cristy Hilts. Jacinto Halim, M.D., did recommend     he be on Plavix for six to nine months.  2. Known coronary artery disease, mild.  He also has history of bridging of     the mid portion of his left anterior descending.  3. Hyperlipidemia.  4. Hypertension.  5. Normal ejection fraction.  6. Gastroesophageal reflux disease.  7. History of palpitations.  8. History of cervical fusion and lumbar spine surgery.      Lezlie Octave, N.P.                        Nicki Guadalajara, M.D.    BB/MEDQ  D:  05/14/2004  T:  05/15/2004  Job:  [id number]

## 2011-10-02 LAB — I-STAT 8, (EC8 V) (CONVERTED LAB)
BUN: 11
Glucose, Bld: 102 — ABNORMAL HIGH
Hemoglobin: 13.9
Potassium: 4.3
Sodium: 142

## 2012-09-24 ENCOUNTER — Other Ambulatory Visit: Payer: Self-pay | Admitting: Dermatology

## 2013-03-02 ENCOUNTER — Other Ambulatory Visit: Payer: Self-pay | Admitting: Orthopaedic Surgery

## 2013-03-02 DIAGNOSIS — M542 Cervicalgia: Secondary | ICD-10-CM

## 2014-01-24 ENCOUNTER — Ambulatory Visit (INDEPENDENT_AMBULATORY_CARE_PROVIDER_SITE_OTHER): Payer: BC Managed Care – PPO | Admitting: Cardiology

## 2014-01-24 ENCOUNTER — Encounter: Payer: Self-pay | Admitting: Cardiology

## 2014-01-24 VITALS — BP 130/90 | HR 108 | Ht 72.0 in | Wt 218.2 lb

## 2014-01-24 DIAGNOSIS — I2510 Atherosclerotic heart disease of native coronary artery without angina pectoris: Secondary | ICD-10-CM

## 2014-01-24 DIAGNOSIS — I10 Essential (primary) hypertension: Secondary | ICD-10-CM

## 2014-01-24 DIAGNOSIS — M069 Rheumatoid arthritis, unspecified: Secondary | ICD-10-CM | POA: Insufficient documentation

## 2014-01-24 DIAGNOSIS — E785 Hyperlipidemia, unspecified: Secondary | ICD-10-CM

## 2014-01-24 DIAGNOSIS — I4891 Unspecified atrial fibrillation: Secondary | ICD-10-CM | POA: Insufficient documentation

## 2014-01-24 LAB — COMPREHENSIVE METABOLIC PANEL
ALT: 21 U/L (ref 0–53)
AST: 23 U/L (ref 0–37)
Albumin: 4.1 g/dL (ref 3.5–5.2)
Alkaline Phosphatase: 66 U/L (ref 39–117)
BUN: 23 mg/dL (ref 6–23)
CO2: 25 mEq/L (ref 19–32)
Calcium: 8.8 mg/dL (ref 8.4–10.5)
Chloride: 105 mEq/L (ref 96–112)
Creat: 0.85 mg/dL (ref 0.50–1.35)
Glucose, Bld: 102 mg/dL — ABNORMAL HIGH (ref 70–99)
Potassium: 4.3 mEq/L (ref 3.5–5.3)
Sodium: 140 mEq/L (ref 135–145)
Total Bilirubin: 0.4 mg/dL (ref 0.2–1.2)
Total Protein: 6.7 g/dL (ref 6.0–8.3)

## 2014-01-24 LAB — CBC
HCT: 43.3 % (ref 39.0–52.0)
Hemoglobin: 14.9 g/dL (ref 13.0–17.0)
MCH: 30.3 pg (ref 26.0–34.0)
MCHC: 34.4 g/dL (ref 30.0–36.0)
MCV: 88 fL (ref 78.0–100.0)
Platelets: 244 10*3/uL (ref 150–400)
RBC: 4.92 MIL/uL (ref 4.22–5.81)
RDW: 13.6 % (ref 11.5–15.5)
WBC: 6.7 10*3/uL (ref 4.0–10.5)

## 2014-01-24 LAB — TSH: TSH: 1.12 u[IU]/mL (ref 0.350–4.500)

## 2014-01-24 MED ORDER — RIVAROXABAN 20 MG PO TABS: 20.0000 mg | ORAL_TABLET | Freq: Every day | ORAL | Status: DC

## 2014-01-24 MED ORDER — METOPROLOL TARTRATE 25 MG PO TABS: 12.5000 mg | ORAL_TABLET | Freq: Two times a day (BID) | ORAL | Status: DC

## 2014-01-24 NOTE — Assessment & Plan Note (Signed)
Stopped his med [employer])

## 2014-01-24 NOTE — Assessment & Plan Note (Signed)
Statin intol 

## 2014-01-24 NOTE — Assessment & Plan Note (Signed)
Followed by Dr Hawks 

## 2014-01-24 NOTE — Assessment & Plan Note (Signed)
No angina 

## 2014-01-24 NOTE — Progress Notes (Signed)
01/24/2014 Nathan Mcclure   1951-03-04  [id number]  Primary Nathan Carrow, MD Primary Cardiologist: Dr Nathan Mcclure  HPI:  63 y/o followed by Dr Nathan Mcclure, LOV in 2013. He has had prior cath x 2 showing mild CAD. Echo in '09 showed NL LVF. Myoview 2010 was low risk. He has had problems with statin therapy in the past. He is in the office today with complaints of palpitations. He admits he has not taken his Metoprolol in several days, "ran out". His EKG shows AF with increased VR.   Current Outpatient Prescriptions  Medication Sig Dispense Refill  . ANDROGEL PUMP 20.25 MG/ACT (1.62%) GEL 2 Pumps daily      . aspirin 81 MG tablet Take 81 mg by mouth. Every other day      . B Complex-C (SUPER B COMPLEX PO) Take 1 tablet by mouth daily.      Marland Kitchen buPROPion (WELLBUTRIN XL) 300 MG 24 hr tablet Take 1 tablet by mouth daily.      . celecoxib (CELEBREX) 200 MG capsule Take 1 capsule by mouth daily.      Marland Kitchen CIALIS 5 MG tablet Take 1 tablet by mouth daily.      Nathan Mcclure SURECLICK 50 MG/ML injection Inject 50 mg into the skin once a week.      . fentaNYL (DURAGESIC - DOSED MCG/HR) 25 MCG/HR patch Place 1 patch onto the skin. Every 2 days      . fexofenadine (ALLEGRA) 180 MG tablet Take 180 mg by mouth daily.      . Glucos-Chondroit-Hyaluron-MSM (GLUCOSAMINE CHONDROITIN JOINT PO) Take 2 tablets by mouth 2 (two) times daily.      . Lansoprazole (PREVACID PO) Take 1 tablet by mouth daily.      Marland Kitchen LYRICA 75 MG capsule Take 1 capsule by mouth 2 (two) times daily.      . Multiple Vitamin (MULTIVITAMIN WITH MINERALS) TABS tablet Take 1 tablet by mouth daily.      . Omega-3 Fatty Acids (FISH OIL) 1000 MG CAPS Take 1 capsule by mouth daily.      Marland Kitchen oxyCODONE (OXY IR/ROXICODONE) 5 MG immediate release tablet Take 1 tablet by mouth as needed.      . metoprolol tartrate (LOPRESSOR) 25 MG tablet Take 0.5 tablets (12.5 mg total) by mouth 2 (two) times daily.  90 tablet  3  . Rivaroxaban (XARELTO) 20 MG TABS tablet Take 1  tablet (20 mg total) by mouth daily with supper.  30 tablet  11   No current facility-administered medications for this visit.    Allergies  Allergen Reactions  . Penicillins     History   Social History  . Marital Status: Married    Spouse Name: N/A    Number of Children: N/A  . Years of Education: N/A   Occupational History  . Not on file.   Social History Main Topics  . Smoking status: Former Smoker    Types: Cigarettes    Quit date: 01/25/2004  . Smokeless tobacco: Not on file  . Alcohol Use: 2.0 oz/week    4 drink(s) per week  . Drug Use: Not on file  . Sexual Activity: Not on file   Other Topics Concern  . Not on file   Social History Narrative  . No narrative on file     Review of Systems: General: negative for chills, fever, night sweats or weight changes.  Cardiovascular: negative for chest pain, dyspnea on exertion, edema, orthopnea, palpitations, paroxysmal nocturnal dyspnea  or shortness of breath Dermatological: negative for rash Respiratory: negative for cough or wheezing Urologic: negative for hematuria Abdominal: negative for nausea, vomiting, diarrhea, bright red blood per rectum, melena, or hematemesis Neurologic: negative for visual changes, syncope, or dizziness All other systems reviewed and are otherwise negative except as noted above.    Blood pressure 130/90, pulse 108, height 6' (1.829 m), weight 218 lb 3.2 oz (98.975 kg).  General appearance: alert, cooperative and no distress Neck: no carotid bruit and no JVD Lungs: clear to auscultation bilaterally Heart: irregularly irregular rhythm Abdomen: non tender Extremities: no edema Pulses: 2+ and symmetric Skin: Skin color, texture, turgor normal. No rashes or lesions Neurologic: Grossly normal  EKG AF, VR 108  ASSESSMENT AND PLAN:   Atrial fibrillation- new onset 01/24/14 New AF with VR 110  CAD- mild CAD '00 and '05. Low risk Nuc 2010 No angina  Dyslipidemia Statin  intol  HTN (hypertension) Stopped his med (Lopressor)  Rheumatoid arthritis Followed by Dr Lenna Gilford   PLAN  I ordered a TSH, CBC, and C met. I refilled his Metoprolol and started him on Xarelto 20 mg (CHA2DS2VASc score of 2 - HTN and CAD). He will follow up with Dr Nathan Mcclure in 1-2 weeks.  [facility] 01/24/2014 4:56 PM

## 2014-01-24 NOTE — Patient Instructions (Signed)
Start Metoprolol 12.5 mg twice a day, take 25 mg if your heart rate is > 100. Labs today Start Xarelto 20 mg daily Follow up with Dr Claiborne Billings

## 2014-01-24 NOTE — Assessment & Plan Note (Signed)
New AF with VR 110

## 2014-02-08 ENCOUNTER — Encounter: Payer: Self-pay | Admitting: Cardiovascular Disease

## 2014-02-08 ENCOUNTER — Ambulatory Visit (INDEPENDENT_AMBULATORY_CARE_PROVIDER_SITE_OTHER): Payer: BC Managed Care – PPO | Admitting: Cardiovascular Disease

## 2014-02-08 VITALS — BP 112/80 | HR 128 | Ht 72.0 in | Wt 217.3 lb

## 2014-02-08 DIAGNOSIS — R0989 Other specified symptoms and signs involving the circulatory and respiratory systems: Secondary | ICD-10-CM

## 2014-02-08 DIAGNOSIS — E785 Hyperlipidemia, unspecified: Secondary | ICD-10-CM

## 2014-02-08 DIAGNOSIS — I10 Essential (primary) hypertension: Secondary | ICD-10-CM

## 2014-02-08 DIAGNOSIS — I2510 Atherosclerotic heart disease of native coronary artery without angina pectoris: Secondary | ICD-10-CM

## 2014-02-08 DIAGNOSIS — M069 Rheumatoid arthritis, unspecified: Secondary | ICD-10-CM

## 2014-02-08 DIAGNOSIS — I4891 Unspecified atrial fibrillation: Secondary | ICD-10-CM

## 2014-02-08 DIAGNOSIS — R0683 Snoring: Secondary | ICD-10-CM

## 2014-02-08 DIAGNOSIS — R0609 Other forms of dyspnea: Secondary | ICD-10-CM

## 2014-02-08 MED ORDER — METOPROLOL TARTRATE 50 MG PO TABS: 50.0000 mg | ORAL_TABLET | Freq: Two times a day (BID) | ORAL | Status: DC

## 2014-02-08 NOTE — Patient Instructions (Signed)
Your physician has requested that you have an echocardiogram. Echocardiography is a painless test that uses sound waves to create images of your heart. It provides your doctor with information about the size and shape of your heart and how well your heart's chambers and valves are working. This procedure takes approximately one hour. There are no restrictions for this procedure.  Your physician has recommended that you have a sleep study. This test records several body functions during sleep, including: brain activity, eye movement, oxygen and carbon dioxide blood levels, heart rate and rhythm, breathing rate and rhythm, the flow of air through your mouth and nose, snoring, body muscle movements, and chest and belly movement.  Your physician has recommended you make the following change in your medication: increase the lopressor to 50 mg twice daily.  Your physician recommends that you schedule a follow-up appointment in: 3 weeks.

## 2014-02-08 NOTE — Progress Notes (Signed)
Patient ID: Nathan Mcclure, male   DOB: [birth year], 63 y.o.   MRN: [id number]     HPI: Nathan Mcclure is a 63 y.o. male is in the office for a two-year cardiology evaluation.  Nathan Mcclure is a 63 year old gentleman who I last seen in March 2013. In 2011/03/12, his wife had passed away secondary to metastatic sinus cancer. 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  He has a history of mild CAD noted a cardiac catheterization in 03/12/99, and his last cardiac catheterization was in May 2005 and he had mild nonobstructive CAD involving the LAD, intermediate, and right current artery. He had been on medical therapy. Also a history of hyperlipidemia. He's been diagnosed with rheumatoid arthritis. He had done well with beta blocker therapy for many years but apparently over a month ago ran out of his beta blocker therapy. He was off beta blocker for approximately 3 weeks and then noticed his heart rate being irregular and fast. He presented to our office and was seen as an add-on on 01/24/2014 5 leuk [place] and he was found to be in atrial fibrillation with ventricular rate at 110 beats per minute. He was restarted on low-dose metoprolol tartrate at 25 mg twice a day. He did have some laboratory data drawn which showed a TSH that was normal at 1.12. He had normal C. met profile. Creatinine was 0.85. He was started on Xarelto 20 mg peritenon normal hemoglobin and hematocrit. He presents now for followup evaluation.  On further questioning, Nathan Mcclure has very poor sleep. He wakes up many times throughout the night. His sleep is nonrestorative. He does have significant excessive daytime sleepiness throughout the day. He symptoms are highly suggestive of obstructive sleep apnea particularly with his development of atrial fibrillation.  He drinks probably one to 2 cups of coffee in the morning and approximately 1-2 Cokes during the  day. He denies any anginal symptoms. He denies PND orthopnea. He takes Celebrex for his rheumatoid arthritis  He does have chronic pain syndrome from cervical disc disease as well as lumbar disc disease.  History reviewed. No pertinent past surgical history.  Allergies  Allergen Reactions  . Penicillins     Current Outpatient Prescriptions  Medication Sig Dispense Refill  . ANDROGEL PUMP 20.25 MG/ACT (1.62%) GEL 2 Pumps daily      . aspirin 81 MG tablet Take 81 mg by mouth. Every other day      . B Complex-C (SUPER B COMPLEX PO) Take 1 tablet by mouth daily.      Marland Kitchen buPROPion (WELLBUTRIN XL) 300 MG 24 hr tablet Take 1 tablet by mouth daily.      . celecoxib (CELEBREX) 200 MG capsule Take 1 capsule by mouth daily.      Marland Kitchen CIALIS 5 MG tablet Take 1 tablet by mouth daily.      . fentaNYL (DURAGESIC - DOSED MCG/HR) 25 MCG/HR patch Place 1 patch onto the skin. Every 2 days      . fexofenadine (ALLEGRA) 180 MG tablet Take 180 mg by mouth daily.      . Glucos-Chondroit-Hyaluron-MSM (GLUCOSAMINE CHONDROITIN JOINT PO) Take 2 tablets by mouth 2 (two) times daily.      . Lansoprazole (PREVACID PO) Take 1 tablet by mouth daily.      Marland Kitchen LYRICA 75 MG capsule Take 1 capsule by mouth 2 (two) times daily.      Marland Kitchen  Multiple Vitamin (MULTIVITAMIN WITH MINERALS) TABS tablet Take 1 tablet by mouth daily.      . Omega-3 Fatty Acids (FISH OIL) 1000 MG CAPS Take 1 capsule by mouth daily.      Marland Kitchen oxyCODONE (OXY IR/ROXICODONE) 5 MG immediate release tablet Take 1 tablet by mouth as needed.      . Rivaroxaban (XARELTO) 20 MG TABS tablet Take 1 tablet (20 mg total) by mouth daily with supper.  30 tablet  11  . temazepam (RESTORIL) 15 MG capsule Take 1 capsule by mouth as needed.      . metoprolol (LOPRESSOR) 50 MG tablet Take 1 tablet (50 mg total) by mouth 2 (two) times daily.  60 tablet  11   No current facility-administered medications for this visit.    History   Social History  . Marital Status: Married     Spouse Name: N/A    Number of Children: N/A  . Years of Education: N/A   Occupational History  . Not on file.   Social History Main Topics  . Smoking status: Former Smoker    Types: Cigarettes    Quit date: 01/25/2004  . Smokeless tobacco: Not on file  . Alcohol Use: 2.0 oz/week    4 drink(s) per week  . Drug Use: Not on file  . Sexual Activity: Not on file   Other Topics Concern  . Not on file   Social History Narrative  . No narrative on file   Socially he is widowed for 3 years. He is now met another woman who is here with him today. There is no tobacco use or alcohol.  History reviewed. No pertinent family history.  ROS is negative for fevers, chills or night sweats. He does have fatigued being very tired.  He denies rash. He denies change in vision or hearing. He denies wheezing, cough or congestion. He denies PND or orthopnea. He denies chest pressure. He denies bleeding issues. He denies blood in stool or urine. He denies nausea vomiting or diarrhea. He does have rheumatoid arthritis. He does have a history of mild CAD but is without angina. He does have hyperlipidemia. He does snore excessively and has significant hypersomnolence during the day. He denies swelling. He denies cold or heat intolerance. There is no diabetes. Other comprehensive 14 point system review is negative.  PE BP 112/80  Pulse 128  Ht 6' (1.829 m)  Wt 217 lb 4.8 oz (98.567 kg)  BMI 29.46 kg/m2  General: Alert, oriented, no distress.  Skin: normal turgor, no rashes HEENT: Normocephalic, atraumatic. Pupils round and reactive; sclera anicteric;no lid lag. Extraocular muscles intact;; no xanthelasmas. Nose without nasal septal hypertrophy Mouth/Parynx benign; Mallinpatti scale 3 Neck: No JVD, no carotid bruits; normal carotid upstroke Lungs: clear to ausculatation and percussion; no wheezing or rales Chest wall: no tenderness to palpitation Heart: RRR, s1 s2 normal; 1/6 systolic murmur;no diastolic  murmur, rub thrills or heaves Abdomen: soft, nontender; no hepatosplenomehaly, BS+; abdominal aorta nontender and not dilated by palpation. Back: no CVA tenderness Pulses 2+ Extremities: no clubbing cyanosis or edema, Homan's sign negative  Neurologic: grossly nonfocal; cranial nerves grossly normal. Psychologic: normal affect and mood.  ECG (independently read by me): Atrial fibrillation with a rapid ventricular response at 128 beats per minute with nonspecific ST-T changes.   LABS:  BMET    Component Value Date/Time   NA 140 01/24/2014 1203   K 4.3 01/24/2014 1203   CL 105 01/24/2014 1203   CO2 25  01/24/2014 1203   GLUCOSE 102* 01/24/2014 1203   BUN 23 01/24/2014 1203   CREATININE 0.85 01/24/2014 1203   CALCIUM 8.8 01/24/2014 1203     Hepatic Function Panel     Component Value Date/Time   PROT 6.7 01/24/2014 1203   ALBUMIN 4.1 01/24/2014 1203   AST 23 01/24/2014 1203   ALT 21 01/24/2014 1203   ALKPHOS 66 01/24/2014 1203   BILITOT 0.4 01/24/2014 1203     CBC    Component Value Date/Time   WBC 6.7 01/24/2014 1203   RBC 4.92 01/24/2014 1203   HGB 14.9 01/24/2014 1203   HCT 43.3 01/24/2014 1203   PLT 244 01/24/2014 1203   MCV 88.0 01/24/2014 1203   MCH 30.3 01/24/2014 1203   MCHC 34.4 01/24/2014 1203   RDW 13.6 01/24/2014 1203     BNP No results found for this basename: probnp    Lipid Panel  No results found for this basename: chol, trig, hdl, cholhdl, vldl, ldlcalc     RADIOLOGY: No results found.    ASSESSMENT AND PLAN: Nathan Mcclure is a 63 year old gentleman who has a 15 year history of documented mild cardiac structure disease which has been fairly stable. Her boldly, he had been on lipid-lowering therapy with Lipitor but apparently is no longer taking this. He developed recent onset of atrial fibrillation after 3 weeks of running out of his metoprolol. He is now back on the Toprol but ventricular rate is still increased despite taking 25 mg twice a day. He has been on Xarelto  anticoagulation at 20 mg with his creatinine clearance in excess of 50. I will further titrate his metoprolol 50 mg twice a day. I am very concerned that he may have significant sleep apnea which may be contributing to his atrial fibrillation. I discussed with him the data regarding importance of treating sleep apnea particularly after cardioversion to the high likelihood of recurrent AF if the sleep apnea is left untreated. I scheduled him for a sleep study. Also discussed the importance of avoidance of caffeine. I will see him back in the office in followup of his echo and sleep study and increase beta blocker regimen in 2-3 weeks and further recommendations were made at that time. He is tentatively planning to go on a cruise the very end of March and hopefully he can have successful cardioversion prior to this.     Troy Sine, MD, [facility]  02/08/2014 6:18 PM

## 2014-02-16 ENCOUNTER — Ambulatory Visit (HOSPITAL_COMMUNITY): Payer: BC Managed Care – PPO

## 2014-02-23 ENCOUNTER — Ambulatory Visit (HOSPITAL_COMMUNITY)
Admission: RE | Admit: 2014-02-23 | Discharge: 2014-02-23 | Disposition: A | Discharge status: Home / Self Care | Payer: BC Managed Care – PPO | Source: Ambulatory Visit | Attending: Cardiology | Admitting: Cardiology

## 2014-02-23 DIAGNOSIS — I4891 Unspecified atrial fibrillation: Secondary | ICD-10-CM | POA: Insufficient documentation

## 2014-02-23 DIAGNOSIS — I059 Rheumatic mitral valve disease, unspecified: Secondary | ICD-10-CM

## 2014-02-23 NOTE — Progress Notes (Signed)
2D Echo Performed 02/23/2014    Nathan Mcclure, RCS

## 2014-03-01 ENCOUNTER — Telehealth: Payer: Self-pay | Admitting: *Deleted

## 2014-03-01 ENCOUNTER — Encounter: Payer: Self-pay | Admitting: *Deleted

## 2014-03-01 NOTE — Telephone Encounter (Signed)
Informed patient that the " Trip Cancellation" form he left has been completed and ready for him to pick up. It will be left for him at the front desk.

## 2014-03-22 ENCOUNTER — Ambulatory Visit (INDEPENDENT_AMBULATORY_CARE_PROVIDER_SITE_OTHER): Payer: BC Managed Care – PPO | Admitting: Cardiovascular Disease

## 2014-03-22 ENCOUNTER — Encounter: Payer: Self-pay | Admitting: Cardiovascular Disease

## 2014-03-22 VITALS — BP 124/90 | HR 98 | Ht 72.0 in | Wt 223.5 lb

## 2014-03-22 DIAGNOSIS — I10 Essential (primary) hypertension: Secondary | ICD-10-CM

## 2014-03-22 DIAGNOSIS — I2510 Atherosclerotic heart disease of native coronary artery without angina pectoris: Secondary | ICD-10-CM

## 2014-03-22 DIAGNOSIS — M069 Rheumatoid arthritis, unspecified: Secondary | ICD-10-CM

## 2014-03-22 DIAGNOSIS — Z7901 Long term (current) use of anticoagulants: Secondary | ICD-10-CM | POA: Insufficient documentation

## 2014-03-22 DIAGNOSIS — R0989 Other specified symptoms and signs involving the circulatory and respiratory systems: Secondary | ICD-10-CM

## 2014-03-22 DIAGNOSIS — E785 Hyperlipidemia, unspecified: Secondary | ICD-10-CM

## 2014-03-22 DIAGNOSIS — R0609 Other forms of dyspnea: Secondary | ICD-10-CM

## 2014-03-22 DIAGNOSIS — I4891 Unspecified atrial fibrillation: Secondary | ICD-10-CM

## 2014-03-22 DIAGNOSIS — R0683 Snoring: Secondary | ICD-10-CM

## 2014-03-22 MED ORDER — PROPAFENONE HCL 150 MG PO TABS: 150.0000 mg | ORAL_TABLET | Freq: Three times a day (TID) | ORAL | Status: DC

## 2014-03-22 NOTE — Patient Instructions (Signed)
Your physician has recommended you make the following change in your medication: start new prescription for propafenone. This has already been sent to the pharmacy.  Your physician recommends that you schedule a follow-up appointment in: 3 weeks.

## 2014-03-22 NOTE — Progress Notes (Signed)
Patient ID: Nathan Mcclure, male   DOB: 01-05-1951, 63 y.o.   MRN: [id number]     HPI: Nathan Mcclure is a 63 y.o. male is in the office for a followup cardiology evaluation of his recent atrial fibrillation.  Nathan Mcclure has a history of mild CAD noted a cardiac catheterization in 2000, and his last cardiac catheterization in May 2005 revealed  mild nonobstructive CAD involving the LAD, intermediate, and right coronary artery. He had been on medical therapy. Also a history of hyperlipidemia. He's been diagnosed with rheumatoid arthritis. He had done well with beta blocker therapy for many years but apparently over a month ago ran out of his beta blocker therapy. He was off beta blocker for approximately 3 weeks and then noticed his heart rate being irregular and fast. He presented to our office and was seen as an add-on on 01/24/2014 5 leuk [place] and he was found to be in atrial fibrillation with ventricular rate at 110 beats per minute. He was restarted on low-dose metoprolol tartrate at 25 mg twice a day. He did have some laboratory data drawn which showed a TSH that was normal at 1.12. He had normal CMP profile. Creatinine was 0.85. He was started on Xarelto 20 mg with  normal hemoglobin and hematocrit. When I last saw him I further increased his metoprolol to 50 mg twice a day. An echo Doppler study was done on 02/23/2014 which showed an ejection fraction of 50-55%. There was mild to moderate mitral regurgitation. His left atrium measured 42 mm.  When I last saw him, Nathan Mcclure admitted to having very poor sleep. He wakes up many times throughout the night. His sleep is nonrestorative. He does have significant excessive daytime sleepiness throughout the day. He symptoms are highly suggestive of obstructive sleep apnea particularly with his development of atrial fibrillation. He is scheduled to undergo a diagnostic polysomnogram next week.  Previously, he had been drinking one to 2 cups of coffee  in the morning and approximately 1-2 Cokes during the day. He denies any anginal symptoms. He denies PND orthopnea. He takes Celebrex for his rheumatoid arthritis.  He does have chronic pain syndrome from cervical disc disease as well as lumbar disc disease.  Since I last saw him, he went on a five-day cruise to the [place] to take those. He did does admit to more fatigued. His denies chest pain.  History reviewed. No pertinent past surgical history.  Allergies  Allergen Reactions  . Penicillins     Current Outpatient Prescriptions  Medication Sig Dispense Refill  . ANDROGEL PUMP 20.25 MG/ACT (1.62%) GEL 2 Pumps daily      . aspirin 81 MG tablet Take 81 mg by mouth. Every other day      . B Complex-C (SUPER B COMPLEX PO) Take 1 tablet by mouth daily.      Marland Kitchen buPROPion (WELLBUTRIN XL) 300 MG 24 hr tablet Take 1 tablet by mouth daily.      . celecoxib (CELEBREX) 200 MG capsule Take 1 capsule by mouth daily.      Marland Kitchen CIALIS 5 MG tablet Take 1 tablet by mouth daily.      Marland Kitchen etanercept (ENBREL) 50 MG/ML injection Inject 50 mg into the skin once a week.      . fentaNYL (DURAGESIC - DOSED MCG/HR) 25 MCG/HR patch Place 1 patch onto the skin. Every 2 days      . fexofenadine (ALLEGRA) 180 MG tablet Take 180 mg by mouth  daily.      . Glucos-Chondroit-Hyaluron-MSM (GLUCOSAMINE CHONDROITIN JOINT PO) Take 2 tablets by mouth 2 (two) times daily.      . Lansoprazole (PREVACID PO) Take 1 tablet by mouth daily.      Marland Kitchen LYRICA 75 MG capsule Take 1 capsule by mouth 2 (two) times daily.      . metoprolol (LOPRESSOR) 50 MG tablet Take 1 tablet (50 mg total) by mouth 2 (two) times daily.  60 tablet  11  . Multiple Vitamin (MULTIVITAMIN WITH MINERALS) TABS tablet Take 1 tablet by mouth daily.      . Omega-3 Fatty Acids (FISH OIL) 1000 MG CAPS Take 1 capsule by mouth daily.      Marland Kitchen oxyCODONE (OXY IR/ROXICODONE) 5 MG immediate release tablet Take 1 tablet by mouth as needed.      . Rivaroxaban (XARELTO) 20 MG TABS  tablet Take 1 tablet (20 mg total) by mouth daily with supper.  30 tablet  11  . temazepam (RESTORIL) 15 MG capsule Take 1 capsule by mouth as needed.      . propafenone (RYTHMOL) 150 MG tablet Take 1 tablet (150 mg total) by mouth every 8 (eight) hours.  90 tablet  6   No current facility-administered medications for this visit.    History   Social History  . Marital Status: Married    Spouse Name: N/A    Number of Children: N/A  . Years of Education: N/A   Occupational History  . Not on file.   Social History Main Topics  . Smoking status: Former Smoker    Types: Cigarettes    Quit date: 01/25/2004  . Smokeless tobacco: Not on file  . Alcohol Use: 2.0 oz/week    4 drink(s) per week  . Drug Use: Not on file  . Sexual Activity: Not on file   Other Topics Concern  . Not on file   Social History Narrative  . No narrative on file   Socially he is widowed for 3 years.  There is no tobacco use.   History reviewed. No pertinent family history.  ROS is negative for fevers, chills or night sweats. He does have fatigued being very tired.  He denies rash. He denies change in vision or hearing. He denies wheezing, cough or congestion. He denies PND or orthopnea. He denies chest pressure. He denies bleeding issues. He denies blood in stool or urine. He denies nausea vomiting or diarrhea. He does have rheumatoid arthritis. He does have a history of mild CAD but is without angina. He does have hyperlipidemia. He does snore excessively and has significant hypersomnolence during the day. He denies swelling. He denies cold or heat intolerance. There is no diabetes. He denies paresthesias. Other comprehensive 14 point system review is negative.  PE BP 124/90  Pulse 98  Ht 6' (1.829 m)  Wt 223 lb 8 oz (101.379 kg)  BMI 30.31 kg/m2  General: Alert, oriented, no distress.  Skin: normal turgor, no rashes HEENT: Normocephalic, atraumatic. Pupils round and reactive; sclera anicteric;no lid  lag. Extraocular muscles intact;; no xanthelasmas. Nose without nasal septal hypertrophy Mouth/Parynx benign; Mallinpatti scale 3 Neck: No JVD, no carotid bruits; normal carotid upstroke Lungs: clear to ausculatation and percussion; no wheezing or rales Chest wall: no tenderness to palpitation Heart: Irregularly irregular with a ventricular rate in the 90s, s1 s2 normal; 1/6 systolic murmur;no diastolic murmur, rub thrills or heaves Abdomen: soft, nontender; no hepatosplenomehaly, BS+; abdominal aorta nontender and not dilated by  palpation. Back: no CVA tenderness Pulses 2+ Extremities: no clubbing cyanosis or edema, Homan's sign negative  Neurologic: grossly nonfocal; cranial nerves grossly normal. Psychologic: normal affect and mood.  ECG (independently read by me) atrial fibrillation at 98 beats per minute. Nonspecific ST-T changes   Prior 02/08/2014 ECG (independently read by me): Atrial fibrillation with a rapid ventricular response at 128 beats per minute with nonspecific ST-T changes.   LABS:  BMET    Component Value Date/Time   NA 140 01/24/2014 1203   K 4.3 01/24/2014 1203   CL 105 01/24/2014 1203   CO2 25 01/24/2014 1203   GLUCOSE 102* 01/24/2014 1203   BUN 23 01/24/2014 1203   CREATININE 0.85 01/24/2014 1203   CALCIUM 8.8 01/24/2014 1203     Hepatic Function Panel     Component Value Date/Time   PROT 6.7 01/24/2014 1203   ALBUMIN 4.1 01/24/2014 1203   AST 23 01/24/2014 1203   ALT 21 01/24/2014 1203   ALKPHOS 66 01/24/2014 1203   BILITOT 0.4 01/24/2014 1203     CBC    Component Value Date/Time   WBC 6.7 01/24/2014 1203   RBC 4.92 01/24/2014 1203   HGB 14.9 01/24/2014 1203   HCT 43.3 01/24/2014 1203   PLT 244 01/24/2014 1203   MCV 88.0 01/24/2014 1203   MCH 30.3 01/24/2014 1203   MCHC 34.4 01/24/2014 1203   RDW 13.6 01/24/2014 1203     BNP No results found for this basename: probnp    Lipid Panel  No results found for this basename: chol,  trig,  hdl,  cholhdl,  vldl,  ldlcalc      RADIOLOGY: No results found.    ASSESSMENT AND PLAN: Nathan Mcclure is a 63 year old gentleman who has a 15 year history of documented mild nonobstructive coronary disease which has been fairly stable. He had been on lipid-lowering therapy with Lipitor but apparently is no longer taking this. He developed recent onset of atrial fibrillation after 3 weeks of running out of his metoprolol. He was started on Xarelto anticoagulation and initial beta blocker therapy with metoprolol 25 mg twice a day and the last last several weeks he has been on an increased dose at 50 mg twice a day. His echo Doppler study shows an ejection fraction of 50-55%. Left atrial size is only mildly increased at 4.2 cm. He will be undergoing his diagnostic polysomnogram for a high index of suspicion for sleep apnea next week. I am starting him on propofenone150 mg to take every 8 hours. I'll see him back in the office in 3-4 weeks. If at that time he is still in atrial fibrillation plans will be made for outpatient DC cardioversion.   Troy Sine, MD, [facility]  03/22/2014 6:35 PM

## 2014-03-29 ENCOUNTER — Telehealth: Payer: Self-pay | Admitting: Cardiovascular Disease

## 2014-03-29 NOTE — Telephone Encounter (Signed)
Dr Claiborne Billings put him on a new medicine for atrial fib,he could not remember the name of it. It is making him tired,dizzy and lightheaded.

## 2014-03-30 ENCOUNTER — Telehealth: Payer: Self-pay | Admitting: *Deleted

## 2014-03-30 NOTE — Telephone Encounter (Signed)
Returned call.  Left message to call back before 4pm.   Of note, the following message was copied from the "Comment" section with unknown author: [facility] 03/29/14 5:10pm.

## 2014-03-30 NOTE — Telephone Encounter (Signed)
C/O tiredness and fatigue w/dizziness starting about 5 days after starting Rythmol.  Yesterday while driving became dizzy and had to pull over for a moment.  Seen by his PCP yesterday states HR was 90 and told his BP was normal. Offered appointment with an extender but patient declined - would rather wait to see Dr. Claiborne Billings (I told him it could be a while) - who wanted to see him 3-4 weeks after last visit per patient, although no appt has been scheduled.  Suggested he take his BP and HR w/symptoms.  He will go to the ER if he begins to feel worse before he is seen. NEEDS APPT W/DR. KELLY ASAP.

## 2014-04-24 NOTE — Telephone Encounter (Signed)
Encounter Closed---04/24/14 TP 

## 2014-04-27 ENCOUNTER — Telehealth: Payer: Self-pay | Admitting: *Deleted

## 2014-04-27 NOTE — Telephone Encounter (Signed)
Nathan Mcclure - You can look for one of his DOD slots or you can send to [facility]/Dr. Claiborne Billings to review.  Thanks. ~ [employer]. Nicki Reaper, BSN, RN

## 2014-04-27 NOTE — Telephone Encounter (Signed)
Pt was calling in regards to his appointment that he was supposed to have. He was supposed to be scheduled for a 3 week follow up appointment from back in April. He was not called to make this appointment. He stated that Dr. Claiborne Billings personally told him to come in to see him in 3 weeks. I have no appointments for him. He does not want to see a PA.  TK

## 2014-04-28 ENCOUNTER — Telehealth: Payer: Self-pay | Admitting: *Deleted

## 2014-04-28 NOTE — Telephone Encounter (Signed)
Encounter Closed---5/8 TP 

## 2014-05-08 ENCOUNTER — Ambulatory Visit (INDEPENDENT_AMBULATORY_CARE_PROVIDER_SITE_OTHER): Payer: BC Managed Care – PPO | Admitting: Cardiovascular Disease

## 2014-05-08 ENCOUNTER — Encounter: Payer: Self-pay | Admitting: Cardiovascular Disease

## 2014-05-08 VITALS — BP 106/74 | HR 94 | Ht 72.0 in | Wt 213.4 lb

## 2014-05-08 DIAGNOSIS — I10 Essential (primary) hypertension: Secondary | ICD-10-CM

## 2014-05-08 DIAGNOSIS — I2510 Atherosclerotic heart disease of native coronary artery without angina pectoris: Secondary | ICD-10-CM

## 2014-05-08 DIAGNOSIS — I4891 Unspecified atrial fibrillation: Secondary | ICD-10-CM

## 2014-05-08 MED ORDER — PROPAFENONE HCL 225 MG PO TABS: 225.0000 mg | ORAL_TABLET | Freq: Three times a day (TID) | ORAL | Status: DC

## 2014-05-08 NOTE — Patient Instructions (Signed)
Your physician has recommended you make the following change in your medication: stop the propafenone 150 mg and start the new prescription for 225mg  this has already been sent to the pharmacy.  Your physician recommends that you schedule a follow-up appointment in: 3 weeks

## 2014-05-08 NOTE — Progress Notes (Signed)
Patient ID: [name], male   DOB: May 13, 1951, 63 y.o.   MRN: [id number]     HPI: Nathan Mcclure is a 63 y.o. male is in the office for a followup cardiology evaluation of his recent atrial fibrillation.  Nathan Mcclure has a history of mild CAD noted a cardiac catheterization in 2000, and his last cardiac catheterization in May 2005 revealed  mild nonobstructive CAD involving the LAD, intermediate, and right coronary artery. He had been on medical therapy. Also a history of hyperlipidemia. He's been diagnosed with rheumatoid arthritis. He had done well with beta blocker therapy for many years but apparently over a month ago ran out of his beta blocker therapy. He was off beta blocker for approximately 3 weeks and then noticed his heart rate being irregular and fast. He presented to our office and was seen as an add-on on 01/24/2014   and he was found to be in atrial fibrillation with ventricular rate at 110 beats per minute. He was restarted on low-dose metoprolol tartrate at 25 mg twice a day. He did have some laboratory data drawn which showed a TSH that was normal at 1.12. He had normal CMP profile. Creatinine was 0.85. He was started on Xarelto 20 mg with  normal hemoglobin and hematocrit. I further increased his metoprolol to 50 mg twice a day. An echo Doppler study  on 02/23/2014  showed an ejection fraction of 50-55%. There was mild to moderate mitral regurgitation. His left atrium measured 42 mm.  When I  saw him, Nathan Mcclure admitted to having very poor sleep. He wakes up many times throughout the night. His sleep is nonrestorative. He does have significant excessive daytime sleepiness throughout the day. He symptoms are highly suggestive of obstructive sleep apnea particularly with his development of atrial fibrillation.  He was scheduled for a sleep study, but had to cancel this.  He has not yet rescheduled a subsequent  appointment.  When I saw him on 6 weeks ago.  I added propofenone 150  mg every 8 hours to his medical regimen.  He still feels fatigued and has significant decreased energy.  He also has chronic pain syndrome from cervical disc disease as well as lumbar disc disease.    History reviewed. No pertinent past surgical history.  Allergies  Allergen Reactions  . Penicillins     Current Outpatient Prescriptions  Medication Sig Dispense Refill  . B Complex-C (SUPER B COMPLEX PO) Take 1 tablet by mouth daily.      . celecoxib (CELEBREX) 200 MG capsule Take 1 capsule by mouth daily.      Marland Kitchen etanercept (ENBREL) 50 MG/ML injection Inject 50 mg into the skin once a week.      . fentaNYL (DURAGESIC - DOSED MCG/HR) 25 MCG/HR patch Place 1 patch onto the skin. Every 2 days      . fexofenadine (ALLEGRA) 180 MG tablet Take 180 mg by mouth daily.      . Glucos-Chondroit-Hyaluron-MSM (GLUCOSAMINE CHONDROITIN JOINT PO) Take 2 tablets by mouth 2 (two) times daily.      . Lansoprazole (PREVACID PO) Take 1 tablet by mouth daily.      Marland Kitchen LORazepam (ATIVAN) 0.5 MG tablet Take 0.5 mg by mouth daily.      Marland Kitchen LYRICA 75 MG capsule Take 1 capsule by mouth 2 (two) times daily.      . metoprolol (LOPRESSOR) 50 MG tablet Take 1 tablet (50 mg total) by mouth 2 (two) times daily.  60 tablet  11  . Multiple Vitamin (MULTIVITAMIN WITH MINERALS) TABS tablet Take 1 tablet by mouth daily.      . Rivaroxaban (XARELTO) 20 MG TABS tablet Take 1 tablet (20 mg total) by mouth daily with supper.  30 tablet  11  . temazepam (RESTORIL) 15 MG capsule Take 1 capsule by mouth as needed.      . Omega-3 Fatty Acids (FISH OIL) 1000 MG CAPS Take 1 capsule by mouth daily.      Marland Kitchen oxyCODONE (OXY IR/ROXICODONE) 5 MG immediate release tablet Take 1 tablet by mouth as needed.      . propafenone (RYTHMOL) 225 MG tablet Take 1 tablet (225 mg total) by mouth every 8 (eight) hours.  30 tablet  6   No current facility-administered medications for this visit.    History   Social History  . Marital Status: Married     Spouse Name: N/A    Number of Children: N/A  . Years of Education: N/A   Occupational History  . Not on file.   Social History Main Topics  . Smoking status: Former Smoker    Types: Cigarettes    Quit date: 01/25/2004  . Smokeless tobacco: Never Used  . Alcohol Use: 2.0 oz/week    4 drink(s) per week  . Drug Use: Not on file  . Sexual Activity: Not on file   Other Topics Concern  . Not on file   Social History Narrative  . No narrative on file   Socially he is widowed for 3 years.  There is no tobacco use.  He is now engaged.  History reviewed. No pertinent family history.  ROS General: Negative; No fevers, chills, or night sweats;  HEENT: Negative; No changes in vision or hearing, sinus congestion, difficulty swallowing Pulmonary: Negative; No cough, wheezing, shortness of breath, hemoptysis Cardiovascular: Negative; No chest pain, presyncope, syncope,  GI: Negative; No nausea, vomiting, diarrhea, or abdominal pain GU: Negative; No dysuria, hematuria, or difficulty voiding Musculoskeletal: Positive for cervical and lumbar disc disease; Hematologic/Oncology: Negative; no easy bruising, bleeding Endocrine: Negative; no heat/cold intolerance; no diabetes Neuro: Negative; no changes in balance, headaches Skin: Negative; No rashes or skin lesions Psychiatric: Negative; No behavioral problems, depression Sleep: Rosalind 4 snoring, daytime sleepiness, hypersomnolence, negative for bruxism, restless legs, hypnogognic hallucinations, no cataplexy Other comprehensive 14 point system review is negative.  PE BP 106/74  Pulse 94  Ht 6' (1.829 m)  Wt 213 lb 6.4 oz (96.798 kg)  BMI 28.94 kg/m2  General: Alert, oriented, no distress.  Skin: normal turgor, no rashes HEENT: Normocephalic, atraumatic. Pupils round and reactive; sclera anicteric;no lid lag. Extraocular muscles intact;; no xanthelasmas. Nose without nasal septal hypertrophy Mouth/Parynx benign; Mallinpatti scale  3 Neck: No JVD, no carotid bruits; normal carotid upstroke Lungs: clear to ausculatation and percussion; no wheezing or rales Chest wall: no tenderness to palpitation Heart: Irregularly irregular with a ventricular rate in the 90s, s1 s2 normal; 1/6 systolic murmur;no diastolic murmur, rub thrills or heaves Abdomen: soft, nontender; no hepatosplenomehaly, BS+; abdominal aorta nontender and not dilated by palpation. Back: no CVA tenderness Pulses 2+ Extremities: no clubbing cyanosis or edema, Homan's sign negative  Neurologic: grossly nonfocal; cranial nerves grossly normal. Psychologic: normal affect and mood.  ECG (independently read by me): atrial fibrillation at 94 beats per minute.  QTc interval 425 ms.  02/23/2014 ECG (independently read by me) atrial fibrillation at 98 beats per minute. Nonspecific ST-T changes   Prior 02/08/2014 ECG (independently  read by me): Atrial fibrillation with a rapid ventricular response at 128 beats per minute with nonspecific ST-T changes.   LABS:  BMET    Component Value Date/Time   NA 140 01/24/2014 1203   K 4.3 01/24/2014 1203   CL 105 01/24/2014 1203   CO2 25 01/24/2014 1203   GLUCOSE 102* 01/24/2014 1203   BUN 23 01/24/2014 1203   CREATININE 0.85 01/24/2014 1203   CALCIUM 8.8 01/24/2014 1203     Hepatic Function Panel     Component Value Date/Time   PROT 6.7 01/24/2014 1203   ALBUMIN 4.1 01/24/2014 1203   AST 23 01/24/2014 1203   ALT 21 01/24/2014 1203   ALKPHOS 66 01/24/2014 1203   BILITOT 0.4 01/24/2014 1203     CBC    Component Value Date/Time   WBC 6.7 01/24/2014 1203   RBC 4.92 01/24/2014 1203   HGB 14.9 01/24/2014 1203   HCT 43.3 01/24/2014 1203   PLT 244 01/24/2014 1203   MCV 88.0 01/24/2014 1203   MCH 30.3 01/24/2014 1203   MCHC 34.4 01/24/2014 1203   RDW 13.6 01/24/2014 1203     BNP No results found for this basename: probnp    Lipid Panel  No results found for this basename: chol,  trig,  hdl,  cholhdl,  vldl,  ldlcalc     RADIOLOGY: No  results found.    ASSESSMENT AND PLAN: Nathan Mcclure is a 63 year old gentleman who has a 15 year history of documented mild nonobstructive coronary disease which has been fairly stable.  He recently developed atrial fibrillation after stopping his metoprolol.  His most recent echo Doppler study confirms an ejection fraction of 50-55% with mild-to-moderate mitral regurgitation and mild biatrial enlargement.  He is been on Propofenone 150 mg every 8 hours and ECG today confirms continued atrial fibrillation.  He  Note marked fatigue and hypersomnolence.  He went further titrate his Propofenone to 225 mg every 8 hours in an attempt to hopefully restore sinus rhythm.  I will see him back in the office in 3 weeks for followup evaluation. If he is still in atrial fibrillation at that time plans will be made for DC cardioversion.   He had to cancel his initial sleep study.  I have contacted the sleep lab today to arrange for a followup sleep study to be done next week if at all possible.  Nathan Sine, MD, [facility]  05/08/2014 6:38 PM

## 2014-05-12 ENCOUNTER — Telehealth: Payer: Self-pay | Admitting: Cardiovascular Disease

## 2014-05-12 NOTE — Telephone Encounter (Signed)
Informed patient of Laura's recommendations. Symptoms may not be related. Patient will see if he notices any difference once he goes back on previous dose. If not, then he states he will return to the increased dose once symptoms subsides. Patient also advised to stay well hydrated.

## 2014-05-12 NOTE — Telephone Encounter (Signed)
Returned a call to patient. He states that ever since Dr. Claiborne Billings increased his propafenone from 150 q8h to 225mg  q8h he has had nausea and some vomiting.  Informed him that I will consult with our NP Cecilie Kicks and call him back with her recommedations. Patient agreed with plan and will await a call back.

## 2014-05-12 NOTE — Telephone Encounter (Signed)
Decrease Propafenone back to 150 mg every 8 hours, but be aware this may be something else entirely.  If any fever or pain, see PCP or ER.

## 2014-05-12 NOTE — Telephone Encounter (Signed)
Dr.Kelly changed his medication to Rythmol 225 mg  3 times a day and he has been sick to his stomach ever since the change . Please call  Thanks

## 2014-06-08 ENCOUNTER — Encounter: Payer: Self-pay | Admitting: Cardiovascular Disease

## 2014-06-08 ENCOUNTER — Ambulatory Visit (INDEPENDENT_AMBULATORY_CARE_PROVIDER_SITE_OTHER): Payer: BC Managed Care – PPO | Admitting: Cardiovascular Disease

## 2014-06-08 VITALS — BP 120/68 | HR 100 | Ht 72.0 in | Wt 212.1 lb

## 2014-06-08 DIAGNOSIS — G4733 Obstructive sleep apnea (adult) (pediatric): Secondary | ICD-10-CM

## 2014-06-08 DIAGNOSIS — I480 Paroxysmal atrial fibrillation: Secondary | ICD-10-CM

## 2014-06-08 DIAGNOSIS — Z7901 Long term (current) use of anticoagulants: Secondary | ICD-10-CM

## 2014-06-08 DIAGNOSIS — E785 Hyperlipidemia, unspecified: Secondary | ICD-10-CM

## 2014-06-08 DIAGNOSIS — I4891 Unspecified atrial fibrillation: Secondary | ICD-10-CM

## 2014-06-08 MED ORDER — METOPROLOL TARTRATE 50 MG PO TABS: 75.0000 mg | ORAL_TABLET | Freq: Two times a day (BID) | ORAL | Status: DC

## 2014-06-08 MED ORDER — PROPAFENONE HCL 150 MG PO TABS: 150.0000 mg | ORAL_TABLET | Freq: Three times a day (TID) | ORAL | Status: DC

## 2014-06-08 NOTE — Patient Instructions (Signed)
Your physician recommends that you schedule a follow-up appointment in: SIX weeks with Dr.Kelly. (First of August)  Begin taking 75mg  of Metoprolol twice Daily.

## 2014-06-09 ENCOUNTER — Encounter: Payer: Self-pay | Admitting: Cardiovascular Disease

## 2014-06-09 DIAGNOSIS — G4733 Obstructive sleep apnea (adult) (pediatric): Secondary | ICD-10-CM | POA: Insufficient documentation

## 2014-06-09 NOTE — Progress Notes (Signed)
Patient ID: Nathan Mcclure, male   DOB: 08/09/1951, 63 y.o.   MRN: [id number]     HPI: Nathan Mcclure is a 63 y.o. male is in the office for a followup cardiology evaluation of his recent atrial fibrillation and sleep evaluation.  [name] has a history of mild CAD noted initially in 2000. His last cardiac catheterization in May 2005 revealed  mild nonobstructive CAD involving the LAD, intermediate, and right coronary artery treated medically. He has a history of hyperlipidemia. He's been diagnosed with rheumatoid arthritis. He had done well with beta blocker therapy for many years but earlier this year .  Stopped taking this when his prescription had run out and he was seen as an add-on on 01/24/2014.  He was found to be in atrial fibrillation with ventricular rate at 110 beats per minute. He was restarted on low-dose metoprolol tartrate at 25 mg twice a day. Laboratory showed a TSH that was normal at 1.12. He had normal CMP profile. Creatinine was 0.85. He was started on Xarelto 20 mg with  normal hemoglobin and hematocrit. I further increased his metoprolol to 50 mg twice a day. An echo Doppler study  on 02/23/2014  showed an ejection fraction of 50-55%. There was mild to moderate mitral regurgitation. His left atrium measured 42 mm.  When I  saw him, [name] admitted to having very poor sleep. He wakes up many times throughout the night. His sleep is nonrestorative. He does have significant excessive daytime sleepiness throughout the day. He symptoms are highly suggestive of obstructive sleep apnea particularly with his development of atrial fibrillation.  He was scheduled for a sleep study, but had to cancel this.    I added propofenone 150 mg every 8 hours to his medical regimen.  When I last saw him on May 18, further titrated Prophet noted to 25 mg every 8 hours.  Since he has not yet had a sleep study.  I again recommended that this be done in an discuss with him the importance of treating  sleep apnea with his underlying atrial fibrillation and planned future.  He recently completed a sleep study.  I do not have the specifics of that test available.  Presently, but he did test positive for obstructive sleep apnea.  That was at least moderate.  He initially was scheduled for CPAP titration.  This week, but had to cancel this secondary to moving from his house.  This apparently has been rescheduled for the week after the Fourth of July.  He was unable to tolerate the increased dose of Propofenone due to nausea and reduced his dose back to 150 mg.  He presents for evaluation.  He denies chest pain.  He denies PND, or orthopnea.  He does have  chronic pain syndrome from cervical disc disease as well as lumbar disc disease.  Past Surgical History  Procedure Laterality Date  . Cardiac catheterization  05/14/2004    Contiued medical therapy  . Cardiovascular stress test  09/19/2009    Observed defect is consistent with diaphragmatic attentuation. EKG negative for ischemia.  . Transthoracic echocardiogram  03/29/2008    EF 67%, mild mitral and tricuspid valve regurg.    Allergies  Allergen Reactions  . Penicillins     Current Outpatient Prescriptions  Medication Sig Dispense Refill  . AXIRON 30 MG/ACT SOLN Apply 2 sprays topically daily.      . B Complex-C (SUPER B COMPLEX PO) Take 1 tablet by mouth daily.      Marland Kitchen  celecoxib (CELEBREX) 200 MG capsule Take 1 capsule by mouth daily.      Marland Kitchen CIALIS 5 MG tablet Take 1 tablet by mouth as needed.      . etanercept (ENBREL) 50 MG/ML injection Inject 50 mg into the skin once a week.      . fentaNYL (DURAGESIC - DOSED MCG/HR) 25 MCG/HR patch Place 1 patch onto the skin. Every 2 days      . fexofenadine (ALLEGRA) 180 MG tablet Take 180 mg by mouth daily.      . Glucos-Chondroit-Hyaluron-MSM (GLUCOSAMINE CHONDROITIN JOINT PO) Take 2 tablets by mouth 2 (two) times daily.      . Lansoprazole (PREVACID PO) Take 1 tablet by mouth daily.      Marland Kitchen  LORazepam (ATIVAN) 0.5 MG tablet Take 0.5 mg by mouth daily.      Marland Kitchen LYRICA 75 MG capsule Take 1 capsule by mouth 2 (two) times daily.      . metoprolol (LOPRESSOR) 50 MG tablet Take 1.5 tablets (75 mg total) by mouth 2 (two) times daily.  180 tablet  2  . Multiple Vitamin (MULTIVITAMIN WITH MINERALS) TABS tablet Take 1 tablet by mouth daily.      Marland Kitchen oxyCODONE (OXY IR/ROXICODONE) 5 MG immediate release tablet Take 1 tablet by mouth as needed.      . Rivaroxaban (XARELTO) 20 MG TABS tablet Take 1 tablet (20 mg total) by mouth daily with supper.  30 tablet  11  . temazepam (RESTORIL) 15 MG capsule Take 1 capsule by mouth as needed.      . propafenone (RYTHMOL) 150 MG tablet Take 1 tablet (150 mg total) by mouth every 8 (eight) hours.  180 tablet  2   No current facility-administered medications for this visit.    History   Social History  . Marital Status: Married    Spouse Name: N/A    Number of Children: N/A  . Years of Education: N/A   Occupational History  . Not on file.   Social History Main Topics  . Smoking status: Former Smoker    Types: Cigarettes    Quit date: 01/25/2004  . Smokeless tobacco: Never Used  . Alcohol Use: 2.0 oz/week    4 drink(s) per week  . Drug Use: Not on file  . Sexual Activity: Not on file   Other Topics Concern  . Not on file   Social History Narrative  . No narrative on file   Socially he is widowed for 3 years.  There is no tobacco use.  He is now engaged.  Family History  Problem Relation Age of Onset  . Diabetes Mother     ROS General: Negative; No fevers, chills, or night sweats;  HEENT: Negative; No changes in vision or hearing, sinus congestion, difficulty swallowing Pulmonary: Negative; No cough, wheezing, shortness of breath, hemoptysis Cardiovascular: Negative; No chest pain, presyncope, syncope,  GI: Negative; No nausea, vomiting, diarrhea, or abdominal pain GU: Negative; No dysuria, hematuria, or difficulty  voiding Musculoskeletal: Positive for cervical and lumbar disc disease; Hematologic/Oncology: Negative; no easy bruising, bleeding Endocrine: Negative; no heat/cold intolerance; no diabetes Neuro: Negative; no changes in balance, headaches Skin: Negative; No rashes or skin lesions Psychiatric: Negative; No behavioral problems, depression Sleep: Positive  for recently documented obstructive sleep apnea, awaiting CPAP titration daytime sleepiness, hypersomnolence, negative for bruxism, restless legs, hypnogognic hallucinations, no cataplexy Other comprehensive 14 point system review is negative.  PE BP 120/68  Pulse 100  Ht 6' (1.829 m)  Wt 212 lb 1.6 oz (96.208 kg)  BMI 28.76 kg/m2  General: Alert, oriented, no distress.  Skin: normal turgor, no rashes HEENT: Normocephalic, atraumatic. Pupils round and reactive; sclera anicteric;no lid lag. Extraocular muscles intact;; no xanthelasmas. Nose without nasal septal hypertrophy Mouth/Parynx benign; Mallinpatti scale 3 Neck: No JVD, no carotid bruits; normal carotid upstroke Lungs: clear to ausculatation and percussion; no wheezing or rales Chest wall: no tenderness to palpitation Heart: Irregularly irregular with a ventricular rate in the 90s, s1 s2 normal; 1/6 systolic murmur;no diastolic murmur, rub thrills or heaves Abdomen: soft, nontender; no hepatosplenomehaly, BS+; abdominal aorta nontender and not dilated by palpation. Back: no CVA tenderness Pulses 2+ Extremities: no clubbing cyanosis or edema, Homan's sign negative  Neurologic: grossly nonfocal; cranial nerves grossly normal. Psychologic: normal affect and mood.  ECG (independently read by me): Atrial fibrillation with a ventricular rate at 100 beats per minute.  QTc interval 428 ms.  Nonspecific T changes  05/08/2014 ECG (independently read by me): atrial fibrillation at 94 beats per minute.  QTc interval 425 ms.  02/23/2014 ECG (independently read by me) atrial fibrillation  at 98 beats per minute. Nonspecific ST-T changes   Prior 02/08/2014 ECG (independently read by me): Atrial fibrillation with a rapid ventricular response at 128 beats per minute with nonspecific ST-T changes.   LABS:  BMET    Component Value Date/Time   NA 140 01/24/2014 1203   K 4.3 01/24/2014 1203   CL 105 01/24/2014 1203   CO2 25 01/24/2014 1203   GLUCOSE 102* 01/24/2014 1203   BUN 23 01/24/2014 1203   CREATININE 0.85 01/24/2014 1203   CALCIUM 8.8 01/24/2014 1203     Hepatic Function Panel     Component Value Date/Time   PROT 6.7 01/24/2014 1203   ALBUMIN 4.1 01/24/2014 1203   AST 23 01/24/2014 1203   ALT 21 01/24/2014 1203   ALKPHOS 66 01/24/2014 1203   BILITOT 0.4 01/24/2014 1203     CBC    Component Value Date/Time   WBC 6.7 01/24/2014 1203   RBC 4.92 01/24/2014 1203   HGB 14.9 01/24/2014 1203   HCT 43.3 01/24/2014 1203   PLT 244 01/24/2014 1203   MCV 88.0 01/24/2014 1203   MCH 30.3 01/24/2014 1203   MCHC 34.4 01/24/2014 1203   RDW 13.6 01/24/2014 1203     BNP No results found for this basename: probnp    Lipid Panel  No results found for this basename: chol,  trig,  hdl,  cholhdl,  vldl,  ldlcalc     RADIOLOGY: No results found.    ASSESSMENT AND PLAN: Mr. Orlyn Odonoghue is a 63 year old gentleman who has mild nonobstructive coronary disease which has been fairly stable.  He recently developed atrial fibrillation after stopping his metoprolol.  His most recent echo Doppler study confirms an ejection fraction of 50-55% with mild-to-moderate mitral regurgitation and mild biatrial enlargement.  He is been on Propofenone 150 mg every 8 hours  in addition to metoprolol 50 mg, but was unable tolerate the increased dose of approximately 225 every 8 hours.  His weight today is increased on further titrating his metoprolol to 75 mg twice a day.  He is scheduled to undergo a CPAP titration study on 06/30/2014.  We will try to expedite initiation of CPAP therapy.  I will see him in the office in  approximately 4-6 weeks  with plans for cardioversion following initiation of CPAP therapy.  He currently is tolerating Xarelto 20 mg coagulation  without bleeding.  Troy Sine, MD, [facility]  06/09/2014 5:11 PM

## 2014-06-17 ENCOUNTER — Encounter: Payer: Self-pay | Admitting: Cardiovascular Disease

## 2014-06-21 ENCOUNTER — Other Ambulatory Visit: Payer: Self-pay | Admitting: *Deleted

## 2014-06-21 MED ORDER — PROPAFENONE HCL 150 MG PO TABS: 150.0000 mg | ORAL_TABLET | Freq: Three times a day (TID) | ORAL | Status: DC

## 2014-08-04 ENCOUNTER — Telehealth: Payer: Self-pay | Admitting: *Deleted

## 2014-08-04 ENCOUNTER — Encounter: Payer: Self-pay | Admitting: Cardiovascular Disease

## 2014-08-04 NOTE — Telephone Encounter (Signed)
Faxed CPAP supply order back.

## 2014-08-07 ENCOUNTER — Telehealth: Payer: Self-pay | Admitting: *Deleted

## 2014-08-07 NOTE — Telephone Encounter (Signed)
Faxed CPAP  Supp[facility] order to [facility].

## 2014-09-09 ENCOUNTER — Encounter: Payer: Self-pay | Admitting: Cardiovascular Disease

## 2014-09-09 ENCOUNTER — Other Ambulatory Visit: Payer: Self-pay | Admitting: Cardiovascular Disease

## 2014-09-09 ENCOUNTER — Encounter: Payer: Self-pay | Admitting: Cardiology

## 2014-10-05 ENCOUNTER — Other Ambulatory Visit: Payer: Self-pay | Admitting: Orthopaedic Surgery

## 2014-10-05 DIAGNOSIS — G894 Chronic pain syndrome: Secondary | ICD-10-CM

## 2014-11-09 ENCOUNTER — Ambulatory Visit (INDEPENDENT_AMBULATORY_CARE_PROVIDER_SITE_OTHER): Payer: BC Managed Care – PPO | Admitting: Cardiovascular Disease

## 2014-11-09 ENCOUNTER — Other Ambulatory Visit: Payer: Self-pay | Admitting: *Deleted

## 2014-11-09 ENCOUNTER — Encounter: Payer: Self-pay | Admitting: Cardiovascular Disease

## 2014-11-09 VITALS — BP 120/78 | HR 78 | Ht 72.0 in | Wt 208.0 lb

## 2014-11-09 DIAGNOSIS — I4891 Unspecified atrial fibrillation: Secondary | ICD-10-CM

## 2014-11-09 DIAGNOSIS — E785 Hyperlipidemia, unspecified: Secondary | ICD-10-CM

## 2014-11-09 DIAGNOSIS — G4733 Obstructive sleep apnea (adult) (pediatric): Secondary | ICD-10-CM

## 2014-11-09 DIAGNOSIS — I10 Essential (primary) hypertension: Secondary | ICD-10-CM

## 2014-11-09 DIAGNOSIS — Z01810 Encounter for preprocedural cardiovascular examination: Secondary | ICD-10-CM

## 2014-11-09 DIAGNOSIS — Z01818 Encounter for other preprocedural examination: Secondary | ICD-10-CM

## 2014-11-09 DIAGNOSIS — I2510 Atherosclerotic heart disease of native coronary artery without angina pectoris: Secondary | ICD-10-CM

## 2014-11-09 DIAGNOSIS — Z7901 Long term (current) use of anticoagulants: Secondary | ICD-10-CM

## 2014-11-09 DIAGNOSIS — Z01811 Encounter for preprocedural respiratory examination: Secondary | ICD-10-CM

## 2014-11-09 NOTE — Patient Instructions (Signed)
Your physician has recommended that you have a Cardioversion (DCCV). Electrical Cardioversion uses a jolt of electricity to your heart either through paddles or wired patches attached to your chest. This is a controlled, usually prescheduled, procedure. Defibrillation is done under light anesthesia in the hospital, and you usually go home the day of the procedure. This is done to get your heart back into a normal rhythm. You are not awake for the procedure. Please see the instruction sheet given to you today. This will be done by Dr. Claiborne Billings.  Your physician recommends that you return for lab work and CXR within 7 days of the procedure @ [employer]  Your physician recommends that you schedule a follow-up appointment 2 weeks post hospital. This appointment will be scheduled at the time of tour discharge.

## 2014-11-11 ENCOUNTER — Encounter: Payer: Self-pay | Admitting: Cardiovascular Disease

## 2014-11-11 NOTE — Progress Notes (Signed)
Patient ID: Nathan Mcclure, male   DOB: [birth year], 63 y.o.   MRN: [id number] Patient ID: Nathan Mcclure, male   DOB: 06-03-1951, 63 y.o.   MRN: [id number]     HPI: Nathan Mcclure is a 63 y.o. male is in the office for a followup cardiology evaluation of his recent atrial fibrillation and sleep evaluation.  Nathan Mcclure has a history of mild CAD initially found in 2000. His last cardiac catheterization in May 2005 revealed  mild nonobstructive CAD involving the LAD, intermediate, and right coronary artery treated medically. He has a history of hyperlipidemia. He's been diagnosed with rheumatoid arthritis. He had done well with beta blocker therapy for many years but earlier this year .  Stopped taking this when his prescription had run out and he was seen as an add-on on 01/24/2014.  He was found to be in atrial fibrillation with ventricular rate at 110 beats per minute. He was restarted on low-dose metoprolol tartrate at 25 mg twice a day. Laboratory showed a TSH that was normal at 1.12. He had normal CMP profile. Creatinine was 0.85. He was started on Xarelto 20 mg with  normal hemoglobin and hematocrit. I further increased his metoprolol to 50 mg twice a day. An echo Doppler study  on 02/23/2014  showed an ejection fraction of 50-55%. There was mild to moderate mitral regurgitation. His left atrium measured 42 mm.  He was initially started on Propofenone150 mg every 8 hours and when attempted to be titrated to 2 and 25 mg 3 times a day.  He was unable to tolerate the increased dose of Propofenone due to nausea and reduced his dose back to 150 mg.   Nathan Mcclure admitted to having very poor sleep. He wakes up many times throughout the night. His sleep is nonrestorative. He does have significant excessive daytime sleepiness throughout the day. He symptoms are highly suggestive of obstructive sleep apnea particularly with his development of atrial fibrillation.    He ultimately underwent a sleep study  which confirmed moderate sleep apnea with an AHI of 15.1.  He dropped his oxygen 87%.  There was heavy snoring.  He had a significant delay in ultimately undergoing a CPAP titration due to his canceling of the study, but ultimately a CPAP trial was done on 06/29/2014.  He was found to develop central apneic events in ultimately was switched to BiPAP.  He was titrated up to an IPAP of 16 and EPAP of 12, but continues to have centrals during sleep-wake junctions.  He has an air curve 10.  The auto [employer] BiPAP unit and his current settings are for minimum EPAP of 10 with a maximum IPAP of 20.  A download was obtained from October 16 through 11/04/2014.  He met compliance with days of use being 80% but days greater than 4 hours was only 27%.  He was averaging 3 hours and 43 minutes of use.  Upon further questioning, he states as the pressure would build up  he would develop significant leakage  around the seal in the mask and mask would essentially blow off his face.  For this reason, he has not been able to be compliant.  He brings his unit with him and mask to the office today for further evaluation and assessment. A review of his download indicates a significant increase leak with 95th sent.  I'll leak at 97.6.  His AHI was 13.4.  A sleepiness scale score today was calculated and this endorsed  at 16, consistent with significant residual daytime sleepiness.  He also has had difficulty with humidification.    He is still in atrial fibrillation, although he recognizes that his ventricular rate is now controlled on his current medical regimen consisting of Mcclure on 150 mmol every 8 hours and metoprolol 75 mg twice a day.  He is on anticoagulation with Xarelto 20 mg.                                                                                        Past Surgical History  Procedure Laterality Date  . Cardiac catheterization  05/14/2004    Contiued medical therapy  . Cardiovascular stress test  09/19/2009     Observed defect is consistent with diaphragmatic attentuation. EKG negative for ischemia.  . Transthoracic echocardiogram  03/29/2008    EF 67%, mild mitral and tricuspid valve regurg.    Allergies  Allergen Reactions  . Penicillins     Current Outpatient Prescriptions  Medication Sig Dispense Refill  . AXIRON 30 MG/ACT SOLN Apply 2 sprays topically daily.    . B Complex-C (SUPER B COMPLEX PO) Take 1 tablet by mouth daily.    . celecoxib (CELEBREX) 200 MG capsule Take 1 capsule by mouth daily.    Marland Kitchen CIALIS 5 MG tablet Take 1 tablet by mouth as needed.    [name] SURECLICK 50 MG/ML injection     . etanercept (ENBREL) 50 MG/ML injection Inject 50 mg into the skin once a week.    . fentaNYL (DURAGESIC - DOSED MCG/HR) 25 MCG/HR patch Place 1 patch onto the skin. Every 2 days    . fexofenadine (ALLEGRA) 180 MG tablet Take 180 mg by mouth daily.    . Glucos-Chondroit-Hyaluron-MSM (GLUCOSAMINE CHONDROITIN JOINT PO) Take 2 tablets by mouth 2 (two) times daily.    . Lansoprazole (PREVACID PO) Take 1 tablet by mouth daily.    Marland Kitchen LORazepam (ATIVAN) 0.5 MG tablet Take 0.5 mg by mouth daily.    Marland Kitchen LYRICA 75 MG capsule Take 1 capsule by mouth 2 (two) times daily.    . metoprolol (LOPRESSOR) 50 MG tablet Take 1.5 tablets (75 mg total) by mouth 2 (two) times daily. 180 tablet 2  . Multiple Vitamin (MULTIVITAMIN WITH MINERALS) TABS tablet Take 1 tablet by mouth daily.    Marland Kitchen oxyCODONE (OXY IR/ROXICODONE) 5 MG immediate release tablet Take 1 tablet by mouth as needed.    . propafenone (RYTHMOL) 150 MG tablet Take 1 tablet (150 mg total) by mouth every 8 (eight) hours. 270 tablet 3  . Rivaroxaban (XARELTO) 20 MG TABS tablet Take 1 tablet (20 mg total) by mouth daily with supper. 30 tablet 11  . sertraline (ZOLOFT) 50 MG tablet   0  . temazepam (RESTORIL) 15 MG capsule Take 1 capsule by mouth as needed.     No current facility-administered medications for this visit.    History   Social History  .  Marital Status: Married    Spouse Name: N/A    Number of Children: N/A  . Years of Education: N/A   Occupational History  . Not on file.   Social  History Main Topics  . Smoking status: Former Smoker    Types: Cigarettes    Quit date: 01/25/2004  . Smokeless tobacco: Never Used  . Alcohol Use: 2.0 oz/week    4 drink(s) per week  . Drug Use: Not on file  . Sexual Activity: Not on file   Other Topics Concern  . Not on file   Social History Narrative   Socially he is widowed for 3 years.  There is no tobacco use.  He is now engaged.  Family History  Problem Relation Age of Onset  . Diabetes Mother     ROS General: Negative; No fevers, chills, or night sweats;  HEENT: Negative; No changes in vision or hearing, sinus congestion, difficulty swallowing Pulmonary: Negative; No cough, wheezing, shortness of breath, hemoptysis Cardiovascular:  unaware of his atrial fibrillation; No chest pain, presyncope, syncope,  GI: Negative; No nausea, vomiting, diarrhea, or abdominal pain GU: Negative; No dysuria, hematuria, or difficulty voiding Musculoskeletal: Positive for cervical and lumbar disc disease; Hematologic/Oncology: Negative; no easy bruising, bleeding Endocrine: Negative; no heat/cold intolerance; no diabetes Neuro: Negative; no changes in balance, headaches Skin: Negative; No rashes or skin lesions Psychiatric: Negative; No behavioral problems, depression Sleep: Positive  For moderate complex obstructive sleep apnea, residual daytime sleepiness, hypersomnolence; negative for bruxism, restless legs, hypnogognic hallucinations, no cataplexy Other comprehensive 14 point system review is negative.  PE BP 120/78 mmHg  Pulse 78  Ht 6' (1.829 m)  Wt 208 lb (94.348 kg)  BMI 28.20 kg/m2  General: Alert, oriented, no distress.  Skin: normal turgor, no rashes HEENT: Normocephalic, atraumatic. Pupils round and reactive; sclera anicteric;no lid lag. Extraocular muscles intact;; no  xanthelasmas. Nose without nasal septal hypertrophy Mouth/Parynx benign; Mallinpatti scale 3 Neck: No JVD, no carotid bruits; normal carotid upstroke Lungs: clear to ausculatation and percussion; no wheezing or rales Chest wall: no tenderness to palpitation Heart: Irregularly irregular with a ventricular rate in the 70's, s1 s2 normal; 1/6 systolic murmur;no diastolic murmur, rub thrills or heaves Abdomen: soft, nontender; no hepatosplenomehaly, BS+; abdominal aorta nontender and not dilated by palpation. Back: no CVA tenderness Pulses 2+ Extremities: no clubbing cyanosis or edema, Homan's sign negative  Neurologic: grossly nonfocal; cranial nerves grossly normal. Psychologic: normal affect and mood.  ECG (independently read by me): Atrial fibrillation with controlled ventricular response in the 70s.  QTc interval 407 ms.  June 2015 ECG (independently read by me): Atrial fibrillation with a ventricular rate at 100 beats per minute.  QTc interval 428 ms.  Nonspecific T changes  05/08/2014 ECG (independently read by me): atrial fibrillation at 94 beats per minute.  QTc interval 425 ms.  02/23/2014 ECG (independently read by me) atrial fibrillation at 98 beats per minute. Nonspecific ST-T changes   Prior 02/08/2014 ECG (independently read by me): Atrial fibrillation with a rapid ventricular response at 128 beats per minute with nonspecific ST-T changes.   LABS:  BMET    Component Value Date/Time   NA 140 01/24/2014 1203   K 4.3 01/24/2014 1203   CL 105 01/24/2014 1203   CO2 25 01/24/2014 1203   GLUCOSE 102* 01/24/2014 1203   BUN 23 01/24/2014 1203   CREATININE 0.85 01/24/2014 1203   CALCIUM 8.8 01/24/2014 1203     Hepatic Function Panel     Component Value Date/Time   PROT 6.7 01/24/2014 1203   ALBUMIN 4.1 01/24/2014 1203   AST 23 01/24/2014 1203   ALT 21 01/24/2014 1203   ALKPHOS 66 01/24/2014 1203  BILITOT 0.4 01/24/2014 1203     CBC    Component Value Date/Time    WBC 6.7 01/24/2014 1203   RBC 4.92 01/24/2014 1203   HGB 14.9 01/24/2014 1203   HCT 43.3 01/24/2014 1203   PLT 244 01/24/2014 1203   MCV 88.0 01/24/2014 1203   MCH 30.3 01/24/2014 1203   MCHC 34.4 01/24/2014 1203   RDW 13.6 01/24/2014 1203     BNP No results found for: PROBNP  Lipid Panel  No results found for: CHOL   RADIOLOGY: No results found.    ASSESSMENT AND PLAN: Nathan Mcclure is a 63 year old gentleman who has mild nonobstructive coronary disease who developed atrial fibrillation after stopping his metoprolol.  His most recent echo Doppler study confirms an ejection fraction of 50-55% with mild-to-moderate mitral regurgitation and mild biatrial enlargement.  He is been on Propofenone 150 mg every 8 hours  in addition to metoprolol 75 mg twice a day but was unable tolerate the increased dose of approximately 225 every 8 hours.  He finally completed his CPAP therapy and has been on BiPAP auto unit for complex sleep apnea since.  He has had difficulty meeting compliance.  Most recently, giving him difficulty with mask leak leading to significant increasing pressure, and ultimate intolerability.  Inspection of his mask today reveals his CL is worn.  I have made adjustments to his unit today and have discontinued his ramp time which had been set at 45 minutes.  He needs a new seal for his mask.  I am also decreasing his minimum pressure from 14/10 to 10/6.  I have suggested nasal saline be used before CPAP use and upon awakening.  We also reduced his humidification to 72, down from 80.  He is in the manual mode.  We did adjust his mask in the office today.  He is committed to meet compliance standards with his unit.  His ventricular rate is well-controlled with reference to his atrial fibrillation and he has been on anticoagulation.  Plans will made for outpatient DC cardioversion to hopefully restore normal sinus rhythm. I have discussed the DC cardioversion including risks and  benefits with the patient in detail and he agrees to pursue this intervention.   Troy Sine, MD, [facility]  11/11/2014 12:18 PM

## 2014-11-15 ENCOUNTER — Ambulatory Visit
Admission: RE | Admit: 2014-11-15 | Discharge: 2014-11-15 | Disposition: A | Discharge status: Home / Self Care | Payer: BC Managed Care – PPO | Source: Ambulatory Visit | Attending: Cardiovascular Disease | Admitting: Cardiovascular Disease

## 2014-11-15 DIAGNOSIS — Z01811 Encounter for preprocedural respiratory examination: Secondary | ICD-10-CM

## 2014-11-16 LAB — COMPREHENSIVE METABOLIC PANEL
ALBUMIN: 4.2 g/dL (ref 3.5–5.2)
ALT: 21 U/L (ref 0–53)
AST: 22 U/L (ref 0–37)
Alkaline Phosphatase: 60 U/L (ref 39–117)
BUN: 21 mg/dL (ref 6–23)
CHLORIDE: 109 meq/L (ref 96–112)
CO2: 25 mEq/L (ref 19–32)
Calcium: 9.2 mg/dL (ref 8.4–10.5)
Creat: 1 mg/dL (ref 0.50–1.35)
Glucose, Bld: 142 mg/dL — ABNORMAL HIGH (ref 70–99)
Potassium: 4.1 mEq/L (ref 3.5–5.3)
Sodium: 143 mEq/L (ref 135–145)
Total Bilirubin: 0.8 mg/dL (ref 0.2–1.2)
Total Protein: 6.9 g/dL (ref 6.0–8.3)

## 2014-11-16 LAB — APTT: aPTT: 33 seconds (ref 24–37)

## 2014-11-16 LAB — CBC
HCT: 47.5 % (ref 39.0–52.0)
HEMOGLOBIN: 16.1 g/dL (ref 13.0–17.0)
MCH: 31 pg (ref 26.0–34.0)
MCHC: 33.9 g/dL (ref 30.0–36.0)
MCV: 91.3 fL (ref 78.0–100.0)
MPV: 10.5 fL (ref 9.4–12.4)
PLATELETS: 212 10*3/uL (ref 150–400)
RBC: 5.2 MIL/uL (ref 4.22–5.81)
RDW: 15 % (ref 11.5–15.5)
WBC: 6 10*3/uL (ref 4.0–10.5)

## 2014-11-16 LAB — PROTIME-INR
INR: 1.3 (ref <1.50)
Prothrombin Time: 16.2 seconds — ABNORMAL HIGH (ref 11.6–15.2)

## 2014-11-20 ENCOUNTER — Ambulatory Visit (HOSPITAL_COMMUNITY): Payer: BC Managed Care – PPO | Admitting: Anesthesiology

## 2014-11-20 ENCOUNTER — Ambulatory Visit (HOSPITAL_COMMUNITY)
Admission: RE | Admit: 2014-11-20 | Discharge: 2014-11-20 | Disposition: A | Discharge status: Home / Self Care | Payer: BC Managed Care – PPO | Source: Ambulatory Visit | Attending: Cardiovascular Disease | Admitting: Cardiovascular Disease

## 2014-11-20 ENCOUNTER — Encounter (HOSPITAL_COMMUNITY)
Admission: RE | Disposition: A | Discharge status: Home / Self Care | Payer: Self-pay | Source: Ambulatory Visit | Attending: Cardiovascular Disease

## 2014-11-20 ENCOUNTER — Encounter (HOSPITAL_COMMUNITY): Payer: Self-pay | Admitting: *Deleted

## 2014-11-20 DIAGNOSIS — Z01810 Encounter for preprocedural cardiovascular examination: Secondary | ICD-10-CM

## 2014-11-20 DIAGNOSIS — G4733 Obstructive sleep apnea (adult) (pediatric): Secondary | ICD-10-CM | POA: Diagnosis not present

## 2014-11-20 DIAGNOSIS — I340 Nonrheumatic mitral (valve) insufficiency: Secondary | ICD-10-CM | POA: Diagnosis not present

## 2014-11-20 DIAGNOSIS — I10 Essential (primary) hypertension: Secondary | ICD-10-CM | POA: Insufficient documentation

## 2014-11-20 DIAGNOSIS — M069 Rheumatoid arthritis, unspecified: Secondary | ICD-10-CM | POA: Insufficient documentation

## 2014-11-20 DIAGNOSIS — I4891 Unspecified atrial fibrillation: Secondary | ICD-10-CM | POA: Diagnosis not present

## 2014-11-20 DIAGNOSIS — I2510 Atherosclerotic heart disease of native coronary artery without angina pectoris: Secondary | ICD-10-CM | POA: Insufficient documentation

## 2014-11-20 DIAGNOSIS — Z7901 Long term (current) use of anticoagulants: Secondary | ICD-10-CM | POA: Insufficient documentation

## 2014-11-20 DIAGNOSIS — G4710 Hypersomnia, unspecified: Secondary | ICD-10-CM | POA: Diagnosis not present

## 2014-11-20 DIAGNOSIS — Z79899 Other long term (current) drug therapy: Secondary | ICD-10-CM | POA: Diagnosis not present

## 2014-11-20 DIAGNOSIS — I4819 Other persistent atrial fibrillation: Secondary | ICD-10-CM

## 2014-11-20 DIAGNOSIS — E785 Hyperlipidemia, unspecified: Secondary | ICD-10-CM | POA: Diagnosis not present

## 2014-11-20 DIAGNOSIS — I481 Persistent atrial fibrillation: Secondary | ICD-10-CM

## 2014-11-20 HISTORY — PX: CARDIOVERSION: SHX1299

## 2014-11-20 SURGERY — CARDIOVERSION
Anesthesia: General

## 2014-11-20 MED ORDER — SODIUM CHLORIDE 0.9 % IJ SOLN: 3.0000 mL | INTRAMUSCULAR | Status: DC | PRN

## 2014-11-20 MED ORDER — PROPOFOL 10 MG/ML IV BOLUS
INTRAVENOUS | Status: DC | PRN
  Administered 2014-11-20: 100 mg via INTRAVENOUS

## 2014-11-20 MED ORDER — LIDOCAINE HCL (CARDIAC) 20 MG/ML IV SOLN
INTRAVENOUS | Status: DC | PRN
  Administered 2014-11-20: 60 mg via INTRAVENOUS

## 2014-11-20 MED ORDER — SODIUM CHLORIDE 0.9 % IJ SOLN
3.0000 mL | Freq: Two times a day (BID) | INTRAMUSCULAR | Status: DC
Start: 2014-11-20 — End: 2014-11-20

## 2014-11-20 MED ORDER — SODIUM CHLORIDE 0.9 % IV SOLN
INTRAVENOUS | Status: DC | PRN
  Administered 2014-11-20: 13:00:00 via INTRAVENOUS

## 2014-11-20 MED ORDER — HYDROCORTISONE 1 % EX CREA: 1.0000 "application " | TOPICAL_CREAM | Freq: Three times a day (TID) | CUTANEOUS | Status: DC | PRN

## 2014-11-20 MED ORDER — SODIUM CHLORIDE 0.9 % IV SOLN
INTRAVENOUS | Status: DC
  Administered 2014-11-20: 500 mL via INTRAVENOUS

## 2014-11-20 MED ORDER — SODIUM CHLORIDE 0.9 % IV SOLN: 250.0000 mL | INTRAVENOUS | Status: DC

## 2014-11-20 NOTE — CV Procedure (Signed)
  CARDIOVERSION NOTE   Procedure: Electrical Cardioversion Indications:  Atrial Fibrillation  Procedure Details:  Consent: Risks of procedure as well as the alternatives and risks of each were explained to the (patient/caregiver).  Consent for procedure obtained.  Time Out: Verified patient identification, verified procedure, site/side was marked, verified correct patient position, special equipment/implants available, medications/allergies/relevent history reviewed, required imaging and test results available.  Performed  Patient placed on cardiac monitor, pulse oximetry, supplemental oxygen as necessary.  Sedation given: Propothol 100 mg and lidocaine 60 mg Pacer pads placed anterior and posterior chest.  Cardioverted 1 time(s).  Cardioverted at 120J.  Evaluation: Findings: Post procedure EKG shows: NSR Complications: None Patient did tolerate procedure well.    Troy Sine, MD, [facility] 11/20/2014 1:09 PM

## 2014-11-20 NOTE — Anesthesia Postprocedure Evaluation (Signed)
  Anesthesia Post-op Note  Patient: Nathan Mcclure  Procedure(s) Performed: Procedure(s): CARDIOVERSION (N/A)  Patient Location: PACU  Anesthesia Type:General  Level of Consciousness: awake, alert  and oriented  Airway and Oxygen Therapy: Patient Spontanous Breathing  Post-op Pain: none  Post-op Assessment: Post-op Vital signs reviewed  Post-op Vital Signs: Reviewed  Last Vitals:  Filed Vitals:   11/20/14 1340  BP: 135/96  Pulse: 54  Temp:   Resp: 14    Complications: No apparent anesthesia complications

## 2014-11-20 NOTE — H&P (View-Only) (Signed)
Patient ID: DREAM NODAL, male   DOB: Dec 24, 1950, 63 y.o.   MRN: [id number] Patient ID: HENDRIX YURKOVICH, male   DOB: [birth year], 63 y.o.   MRN: [id number]     HPI: ZAIDE KARDELL is a 63 y.o. male is in the office for a followup cardiology evaluation of his recent atrial fibrillation and sleep evaluation.  [name] has a history of mild CAD initially found in 2000. His last cardiac catheterization in May 2005 revealed  mild nonobstructive CAD involving the LAD, intermediate, and right coronary artery treated medically. He has a history of hyperlipidemia. He's been diagnosed with rheumatoid arthritis. He had done well with beta blocker therapy for many years but earlier this year .  Stopped taking this when his prescription had run out and he was seen as an add-on on 01/24/2014.  He was found to be in atrial fibrillation with ventricular rate at 110 beats per minute. He was restarted on low-dose metoprolol tartrate at 25 mg twice a day. Laboratory showed a TSH that was normal at 1.12. He had normal CMP profile. Creatinine was 0.85. He was started on Xarelto 20 mg with  normal hemoglobin and hematocrit. I further increased his metoprolol to 50 mg twice a day. An echo Doppler study  on 02/23/2014  showed an ejection fraction of 50-55%. There was mild to moderate mitral regurgitation. His left atrium measured 42 mm.  He was initially started on Propofenone150 mg every 8 hours and when attempted to be titrated to 2 and 25 mg 3 times a day.  He was unable to tolerate the increased dose of Propofenone due to nausea and reduced his dose back to 150 mg.   Mr. Lafavor admitted to having very poor sleep. He wakes up many times throughout the night. His sleep is nonrestorative. He does have significant excessive daytime sleepiness throughout the day. He symptoms are highly suggestive of obstructive sleep apnea particularly with his development of atrial fibrillation.    He ultimately underwent a sleep study  which confirmed moderate sleep apnea with an AHI of 15.1.  He dropped his oxygen 87%.  There was heavy snoring.  He had a significant delay in ultimately undergoing a CPAP titration due to his canceling of the study, but ultimately a CPAP trial was done on 06/29/2014.  He was found to develop central apneic events in ultimately was switched to BiPAP.  He was titrated up to an IPAP of 16 and EPAP of 12, but continues to have centrals during sleep-wake junctions.  He has an air curve 10.  The auto [employer]Med BiPAP unit and his current settings are for minimum EPAP of 10 with a maximum IPAP of 20.  A download was obtained from October 16 through 11/04/2014.  He met compliance with days of use being 80% but days greater than 4 hours was only 27%.  He was averaging 3 hours and 43 minutes of use.  Upon further questioning, he states as the pressure would build up  he would develop significant leakage  around the seal in the mask and mask would essentially blow off his face.  For this reason, he has not been able to be compliant.  He brings his unit with him and mask to the office today for further evaluation and assessment. A review of his download indicates a significant increase leak with 95th sent.  I'll leak at 97.6.  His AHI was 13.4.  A sleepiness scale score today was calculated and this endorsed  at 16, consistent with significant residual daytime sleepiness.  He also has had difficulty with humidification.    He is still in atrial fibrillation, although he recognizes that his ventricular rate is now controlled on his current medical regimen consisting of Proffitt on 150 mmol every 8 hours and metoprolol 75 mg twice a day.  He is on anticoagulation with Xarelto 20 mg.                                                                                        Past Surgical History  Procedure Laterality Date  . Cardiac catheterization  05/14/2004    Contiued medical therapy  . Cardiovascular stress test  09/19/2009     Observed defect is consistent with diaphragmatic attentuation. EKG negative for ischemia.  . Transthoracic echocardiogram  03/29/2008    EF 67%, mild mitral and tricuspid valve regurg.    Allergies  Allergen Reactions  . Penicillins     Current Outpatient Prescriptions  Medication Sig Dispense Refill  . AXIRON 30 MG/ACT SOLN Apply 2 sprays topically daily.    . B Complex-C (SUPER B COMPLEX PO) Take 1 tablet by mouth daily.    . celecoxib (CELEBREX) 200 MG capsule Take 1 capsule by mouth daily.    Marland Kitchen CIALIS 5 MG tablet Take 1 tablet by mouth as needed.    [name] SURECLICK 50 MG/ML injection     . etanercept (ENBREL) 50 MG/ML injection Inject 50 mg into the skin once a week.    . fentaNYL (DURAGESIC - DOSED MCG/HR) 25 MCG/HR patch Place 1 patch onto the skin. Every 2 days    . fexofenadine (ALLEGRA) 180 MG tablet Take 180 mg by mouth daily.    . Glucos-Chondroit-Hyaluron-MSM (GLUCOSAMINE CHONDROITIN JOINT PO) Take 2 tablets by mouth 2 (two) times daily.    . Lansoprazole (PREVACID PO) Take 1 tablet by mouth daily.    Marland Kitchen LORazepam (ATIVAN) 0.5 MG tablet Take 0.5 mg by mouth daily.    Marland Kitchen LYRICA 75 MG capsule Take 1 capsule by mouth 2 (two) times daily.    . metoprolol (LOPRESSOR) 50 MG tablet Take 1.5 tablets (75 mg total) by mouth 2 (two) times daily. 180 tablet 2  . Multiple Vitamin (MULTIVITAMIN WITH MINERALS) TABS tablet Take 1 tablet by mouth daily.    Marland Kitchen oxyCODONE (OXY IR/ROXICODONE) 5 MG immediate release tablet Take 1 tablet by mouth as needed.    . propafenone (RYTHMOL) 150 MG tablet Take 1 tablet (150 mg total) by mouth every 8 (eight) hours. 270 tablet 3  . Rivaroxaban (XARELTO) 20 MG TABS tablet Take 1 tablet (20 mg total) by mouth daily with supper. 30 tablet 11  . sertraline (ZOLOFT) 50 MG tablet   0  . temazepam (RESTORIL) 15 MG capsule Take 1 capsule by mouth as needed.     No current facility-administered medications for this visit.    History   Social History  .  Marital Status: Married    Spouse Name: N/A    Number of Children: N/A  . Years of Education: N/A   Occupational History  . Not on file.   Social  History Main Topics  . Smoking status: Former Smoker    Types: Cigarettes    Quit date: 01/25/2004  . Smokeless tobacco: Never Used  . Alcohol Use: 2.0 oz/week    4 drink(s) per week  . Drug Use: Not on file  . Sexual Activity: Not on file   Other Topics Concern  . Not on file   Social History Narrative   Socially he is widowed for 3 years.  There is no tobacco use.  He is now engaged.  Family History  Problem Relation Age of Onset  . Diabetes Mother     ROS General: Negative; No fevers, chills, or night sweats;  HEENT: Negative; No changes in vision or hearing, sinus congestion, difficulty swallowing Pulmonary: Negative; No cough, wheezing, shortness of breath, hemoptysis Cardiovascular:  unaware of his atrial fibrillation; No chest pain, presyncope, syncope,  GI: Negative; No nausea, vomiting, diarrhea, or abdominal pain GU: Negative; No dysuria, hematuria, or difficulty voiding Musculoskeletal: Positive for cervical and lumbar disc disease; Hematologic/Oncology: Negative; no easy bruising, bleeding Endocrine: Negative; no heat/cold intolerance; no diabetes Neuro: Negative; no changes in balance, headaches Skin: Negative; No rashes or skin lesions Psychiatric: Negative; No behavioral problems, depression Sleep: Positive  For moderate complex obstructive sleep apnea, residual daytime sleepiness, hypersomnolence; negative for bruxism, restless legs, hypnogognic hallucinations, no cataplexy Other comprehensive 14 point system review is negative.  PE BP 120/78 mmHg  Pulse 78  Ht 6' (1.829 m)  Wt 208 lb (94.348 kg)  BMI 28.20 kg/m2  General: Alert, oriented, no distress.  Skin: normal turgor, no rashes HEENT: Normocephalic, atraumatic. Pupils round and reactive; sclera anicteric;no lid lag. Extraocular muscles intact;; no  xanthelasmas. Nose without nasal septal hypertrophy Mouth/Parynx benign; Mallinpatti scale 3 Neck: No JVD, no carotid bruits; normal carotid upstroke Lungs: clear to ausculatation and percussion; no wheezing or rales Chest wall: no tenderness to palpitation Heart: Irregularly irregular with a ventricular rate in the 70's, s1 s2 normal; 1/6 systolic murmur;no diastolic murmur, rub thrills or heaves Abdomen: soft, nontender; no hepatosplenomehaly, BS+; abdominal aorta nontender and not dilated by palpation. Back: no CVA tenderness Pulses 2+ Extremities: no clubbing cyanosis or edema, Homan's sign negative  Neurologic: grossly nonfocal; cranial nerves grossly normal. Psychologic: normal affect and mood.  ECG (independently read by me): Atrial fibrillation with controlled ventricular response in the 70s.  QTc interval 407 ms.  June 2015 ECG (independently read by me): Atrial fibrillation with a ventricular rate at 100 beats per minute.  QTc interval 428 ms.  Nonspecific T changes  05/08/2014 ECG (independently read by me): atrial fibrillation at 94 beats per minute.  QTc interval 425 ms.  02/23/2014 ECG (independently read by me) atrial fibrillation at 98 beats per minute. Nonspecific ST-T changes   Prior 02/08/2014 ECG (independently read by me): Atrial fibrillation with a rapid ventricular response at 128 beats per minute with nonspecific ST-T changes.   LABS:  BMET    Component Value Date/Time   NA 140 01/24/2014 1203   K 4.3 01/24/2014 1203   CL 105 01/24/2014 1203   CO2 25 01/24/2014 1203   GLUCOSE 102* 01/24/2014 1203   BUN 23 01/24/2014 1203   CREATININE 0.85 01/24/2014 1203   CALCIUM 8.8 01/24/2014 1203     Hepatic Function Panel     Component Value Date/Time   PROT 6.7 01/24/2014 1203   ALBUMIN 4.1 01/24/2014 1203   AST 23 01/24/2014 1203   ALT 21 01/24/2014 1203   ALKPHOS 66 01/24/2014 1203  BILITOT 0.4 01/24/2014 1203     CBC    Component Value Date/Time    WBC 6.7 01/24/2014 1203   RBC 4.92 01/24/2014 1203   HGB 14.9 01/24/2014 1203   HCT 43.3 01/24/2014 1203   PLT 244 01/24/2014 1203   MCV 88.0 01/24/2014 1203   MCH 30.3 01/24/2014 1203   MCHC 34.4 01/24/2014 1203   RDW 13.6 01/24/2014 1203     BNP No results found for: PROBNP  Lipid Panel  No results found for: CHOL   RADIOLOGY: No results found.    ASSESSMENT AND PLAN: Mr. Elmus Mathes is a 63 year old gentleman who has mild nonobstructive coronary disease who developed atrial fibrillation after stopping his metoprolol.  His most recent echo Doppler study confirms an ejection fraction of 50-55% with mild-to-moderate mitral regurgitation and mild biatrial enlargement.  He is been on Propofenone 150 mg every 8 hours  in addition to metoprolol 75 mg twice a day but was unable tolerate the increased dose of approximately 225 every 8 hours.  He finally completed his CPAP therapy and has been on BiPAP auto unit for complex sleep apnea since.  He has had difficulty meeting compliance.  Most recently, giving him difficulty with mask leak leading to significant increasing pressure, and ultimate intolerability.  Inspection of his mask today reveals his CL is worn.  I have made adjustments to his unit today and have discontinued his ramp time which had been set at 45 minutes.  He needs a new seal for his mask.  I am also decreasing his minimum pressure from 14/10 to 10/6.  I have suggested nasal saline be used before CPAP use and upon awakening.  We also reduced his humidification to 72, down from 80.  He is in the manual mode.  We did adjust his mask in the office today.  He is committed to meet compliance standards with his unit.  His ventricular rate is well-controlled with reference to his atrial fibrillation and he has been on anticoagulation.  Plans will made for outpatient DC cardioversion to hopefully restore normal sinus rhythm. I have discussed the DC cardioversion including risks and  benefits with the patient in detail and he agrees to pursue this intervention.   Troy Sine, MD, [facility]  11/11/2014 12:18 PM

## 2014-11-20 NOTE — Discharge Instructions (Addendum)

## 2014-11-20 NOTE — Transfer of Care (Signed)
Immediate Anesthesia Transfer of Care Note  Patient: Nathan Mcclure  Procedure(s) Performed: Procedure(s): CARDIOVERSION (N/A)  Patient Location: Endoscopy Unit  Anesthesia Type:MAC  Level of Consciousness: awake  Airway & Oxygen Therapy: Patient Spontanous Breathing and Patient connected to nasal cannula oxygen  Post-op Assessment: Report given to PACU RN and Post -op Vital signs reviewed and stable  Post vital signs: Reviewed and stable  Complications: No apparent anesthesia complications

## 2014-11-20 NOTE — Interval H&P Note (Signed)
History and Physical Interval Note:  11/20/2014 12:52 PM  Bryan Lemma  has presented today for surgery, with the diagnosis of A FIB   The various methods of treatment have been discussed with the patient and family. After consideration of risks, benefits and other options for treatment, the patient has consented to  Procedure(s): CARDIOVERSION (N/A) as a surgical intervention .  The patient's history has been reviewed, patient examined, no change in status, stable for surgery.  I have reviewed the patient's chart and labs.  Questions were answered to the patient's satisfaction.     KELLY,THOMAS A

## 2014-11-20 NOTE — Anesthesia Preprocedure Evaluation (Signed)
Anesthesia Evaluation   Patient awake    Reviewed: Allergy & Precautions, H&P , NPO status , Patient's Chart, lab work & pertinent test results, reviewed documented beta blocker date and time , Unable to perform ROS - Chart review only  Airway        Dental   Pulmonary sleep apnea , former smoker,          Cardiovascular hypertension, Pt. on medications + dysrhythmias Atrial Fibrillation     Neuro/Psych    GI/Hepatic   Endo/Other    Renal/GU      Musculoskeletal   Abdominal   Peds  Hematology   Anesthesia Other Findings   Reproductive/Obstetrics                             Anesthesia Physical Anesthesia Plan  ASA: III  Anesthesia Plan: General   Post-op Pain Management:    Induction: Intravenous  Airway Management Planned: Nasal Cannula  Additional Equipment:   Intra-op Plan:   Post-operative Plan:   Informed Consent: I have reviewed the patients History and Physical, chart, labs and discussed the procedure including the risks, benefits and alternatives for the proposed anesthesia with the patient or authorized representative who has indicated his/her understanding and acceptance.     Plan Discussed with:   Anesthesia Plan Comments:         Anesthesia Quick Evaluation

## 2014-11-21 ENCOUNTER — Encounter: Payer: Self-pay | Admitting: Cardiovascular Disease

## 2014-11-21 ENCOUNTER — Encounter (HOSPITAL_COMMUNITY): Payer: Self-pay | Admitting: Cardiovascular Disease

## 2014-11-24 ENCOUNTER — Telehealth: Payer: Self-pay | Admitting: *Deleted

## 2014-11-24 NOTE — Telephone Encounter (Signed)
-----   Message from Troy Sine, MD sent at 11/20/2014  7:17 PM EST ----- No active cardiopulmonary disease.

## 2014-12-11 ENCOUNTER — Other Ambulatory Visit: Payer: BC Managed Care – PPO

## 2014-12-12 ENCOUNTER — Ambulatory Visit
Admission: RE | Admit: 2014-12-12 | Discharge: 2014-12-12 | Disposition: A | Discharge status: Home / Self Care | Payer: BC Managed Care – PPO | Source: Ambulatory Visit | Attending: Orthopaedic Surgery | Admitting: Orthopaedic Surgery

## 2014-12-12 DIAGNOSIS — G894 Chronic pain syndrome: Secondary | ICD-10-CM

## 2015-01-17 ENCOUNTER — Telehealth: Payer: Self-pay | Admitting: Cardiovascular Disease

## 2015-01-17 NOTE — Telephone Encounter (Signed)
Spoke w/ Dr. Claiborne Billings, he recommends cutting metoprolol in half to 25mg  BID, leave Rhythmol unchanged, this should resolve symptoms. This was communicated w/ patient, he voiced understanding. I recommended he keep watch on HR and BP. He will follow up next week w/ a report on symptom improvements.

## 2015-01-17 NOTE — Telephone Encounter (Signed)
Pt prescribed rhythmol 150mg  BID and metoprolol 50mg  BID. He is taking both BID as scheduled and stating symptoms of fatigue.   Resting HR 42 today, 40s range has been typical lately, he states historically he would be in upper 50s.  Wants to know if he needs to take his meds at this frequency still.    States no current problems w/ his atrial fibrillation since cardioversion on 11/20/14.   Can we reduce frequency and/or dosages for both or either?

## 2015-01-17 NOTE — Telephone Encounter (Signed)
Pt c/o medication issue:  1. Name of Medication: Metoprolol, and Rythmal   2. How are you currently taking this medication (dosage and times per day)? Twice a day for both medications   3. Are you having a reaction (difficulty breathing--STAT)?Heart rate is back in rhythm again , but its making him awfully tired   4. What is your medication issue? Wants to know do he still needs to take it twice a day .

## 2015-02-05 ENCOUNTER — Other Ambulatory Visit: Payer: Self-pay | Admitting: Cardiology

## 2015-02-05 MED ORDER — METOPROLOL TARTRATE 50 MG PO TABS: 75.0000 mg | ORAL_TABLET | Freq: Two times a day (BID) | ORAL | Status: DC

## 2015-02-23 ENCOUNTER — Telehealth: Payer: Self-pay | Admitting: Cardiovascular Disease

## 2015-02-23 NOTE — Telephone Encounter (Signed)
Xarelto refilled  30 tablet 6 02/05/2015   Patient notified. Called pharmacy to confirm that Rx sent in on 02/05/15 was received - states is ready for patient to pick up

## 2015-02-23 NOTE — Telephone Encounter (Signed)
°  1. Which medications need to be refilled? Xarelto-please call this in today-going out of town for [employer] fineral  2. Which pharmacy is medication to be sent to?[phone]  3. Do they need a 30 day or 90 day supply? 30 and refills 4. Would they like a call back once the medication has been sent to the pharmacy? yes

## 2015-06-08 ENCOUNTER — Encounter: Payer: Self-pay | Admitting: Cardiovascular Disease

## 2015-06-20 ENCOUNTER — Telehealth: Payer: Self-pay | Admitting: *Deleted

## 2015-06-20 NOTE — Telephone Encounter (Signed)
-----   Message from Troy Sine, MD sent at 06/17/2015  5:31 PM EDT ----- Labs from [facility] reviewed. Inc chol and LDL. ? Was he started on Rx by his primary MD

## 2015-06-20 NOTE — Telephone Encounter (Signed)
Called patient to see if his PCP gave him a statin prescription after his last lipid panel. He states that he was given simvastatin 40 mg. He takes this 3 times per week. Dr. Claiborne Billings will be notified.

## 2015-06-24 NOTE — Telephone Encounter (Signed)
ok 

## 2015-09-16 ENCOUNTER — Other Ambulatory Visit: Payer: Self-pay | Admitting: Cardiovascular Disease

## 2015-10-23 ENCOUNTER — Ambulatory Visit: Payer: Self-pay | Admitting: Podiatry

## 2015-11-02 ENCOUNTER — Encounter: Payer: Self-pay | Admitting: Podiatry

## 2015-11-02 ENCOUNTER — Ambulatory Visit (INDEPENDENT_AMBULATORY_CARE_PROVIDER_SITE_OTHER): Payer: BLUE CROSS/BLUE SHIELD | Admitting: Podiatry

## 2015-11-02 ENCOUNTER — Ambulatory Visit (INDEPENDENT_AMBULATORY_CARE_PROVIDER_SITE_OTHER): Payer: BLUE CROSS/BLUE SHIELD

## 2015-11-02 VITALS — BP 140/82 | HR 50 | Resp 16 | Ht 72.0 in | Wt 205.0 lb

## 2015-11-02 DIAGNOSIS — M779 Enthesopathy, unspecified: Secondary | ICD-10-CM | POA: Diagnosis not present

## 2015-11-02 DIAGNOSIS — M79672 Pain in left foot: Secondary | ICD-10-CM

## 2015-11-02 DIAGNOSIS — M205X1 Other deformities of toe(s) (acquired), right foot: Secondary | ICD-10-CM

## 2015-11-02 DIAGNOSIS — M79671 Pain in right foot: Secondary | ICD-10-CM

## 2015-11-02 MED ORDER — TRIAMCINOLONE ACETONIDE 10 MG/ML IJ SUSP
10.0000 mg | Freq: Once | INTRAMUSCULAR | Status: AC
  Administered 2015-11-02: 10 mg

## 2015-11-02 NOTE — Progress Notes (Signed)
   Subjective:    Patient ID: Nathan Mcclure, male    DOB: March 09, 1951, 64 y.o.   MRN: [id number]  HPI  Patient presents with bilateral foot pain; across top of foot; interdigital 1st & 2nd toes; great toes-numbness in toe-medial side. Pt stated, "RA in feet, but medicine is not helping". This has been going on for the past year.  Review of Systems  Constitutional: Positive for fatigue.  Musculoskeletal: Positive for back pain and arthralgias.  All other systems reviewed and are negative.      Objective:   Physical Exam        Assessment & Plan:

## 2015-11-04 NOTE — Progress Notes (Signed)
Subjective:     Patient ID: Nathan Mcclure, male   DOB: [birth year], 64 y.o.   MRN: [id number]  HPI patient presents stating I have had pain for several years and my big toe joint right and it's been ongoing. Patient states that Nathan Mcclure was diagnosed with rheumatoid arthritis and is been on methotrexate but that is not doing anything to help the big joint pain   Review of Systems  All other systems reviewed and are negative.      Objective:   Physical Exam  Constitutional: Nathan Mcclure is oriented to person, place, and time.  Cardiovascular: Intact distal pulses.   Musculoskeletal: Normal range of motion.  Neurological: Nathan Mcclure is oriented to person, place, and time.  Skin: Skin is warm.  Nursing note and vitals reviewed.  neurovascular status found to be intact muscle strength was adequate range of motion within normal limits with patient having severe spurring around the big toe joint right with exquisite discomfort lateral and dorsal and significant reduction of range of motion with crepitus within the joint surface. Digital perfusion was normal well oriented 3 with no other swelling or what appears to be systemic pathology     Assessment:     Hallux limitus rigidus deformity right first MPJ which may be more the cause of this of the problem versus rheumatoid arthritis     Plan:     Explained condition to patient and at this time reviewed the x-rays and the large spurs and the damage to the joint surface. At this time her to try conservative care consisting of steroidal injection from the lateral and dorsal perspective and long-term consider either implant or osteotomy surgery and Nathan Mcclure wants this done but needs to wait till January. Nathan Mcclure is scheduled for consult in January and we will review how Nathan Mcclure does with medication and reevaluate this

## 2015-11-10 ENCOUNTER — Other Ambulatory Visit: Payer: Self-pay | Admitting: Cardiovascular Disease

## 2015-11-12 ENCOUNTER — Encounter: Payer: Self-pay | Admitting: Cardiovascular Disease

## 2015-11-12 ENCOUNTER — Ambulatory Visit (INDEPENDENT_AMBULATORY_CARE_PROVIDER_SITE_OTHER): Payer: BLUE CROSS/BLUE SHIELD | Admitting: Cardiovascular Disease

## 2015-11-12 VITALS — BP 140/98 | HR 101 | Ht 72.0 in | Wt 209.1 lb

## 2015-11-12 DIAGNOSIS — Z79899 Other long term (current) drug therapy: Secondary | ICD-10-CM | POA: Diagnosis not present

## 2015-11-12 DIAGNOSIS — I2583 Coronary atherosclerosis due to lipid rich plaque: Secondary | ICD-10-CM

## 2015-11-12 DIAGNOSIS — Z7901 Long term (current) use of anticoagulants: Secondary | ICD-10-CM

## 2015-11-12 DIAGNOSIS — G4733 Obstructive sleep apnea (adult) (pediatric): Secondary | ICD-10-CM

## 2015-11-12 DIAGNOSIS — R5383 Other fatigue: Secondary | ICD-10-CM | POA: Diagnosis not present

## 2015-11-12 DIAGNOSIS — I2510 Atherosclerotic heart disease of native coronary artery without angina pectoris: Secondary | ICD-10-CM

## 2015-11-12 DIAGNOSIS — I480 Paroxysmal atrial fibrillation: Secondary | ICD-10-CM | POA: Diagnosis not present

## 2015-11-12 DIAGNOSIS — E785 Hyperlipidemia, unspecified: Secondary | ICD-10-CM | POA: Diagnosis not present

## 2015-11-12 DIAGNOSIS — I10 Essential (primary) hypertension: Secondary | ICD-10-CM

## 2015-11-12 MED ORDER — ROSUVASTATIN CALCIUM 10 MG PO TABS: 10.0000 mg | ORAL_TABLET | Freq: Every day | ORAL | Status: DC

## 2015-11-12 MED ORDER — RIVAROXABAN 20 MG PO TABS: ORAL_TABLET | ORAL | Status: DC

## 2015-11-12 NOTE — Patient Instructions (Signed)
Your physician recommends that you schedule a follow-up appointment in: December  Your physician recommends that you return for lab work Fasting   Your physician has recommended you make the following change in your medication: Take Metoprolol 75 mg (1.5 tablets) in morning and 50 mg in the evening  Your physician has requested that you have an echocardiogram. Echocardiography is a painless test that uses sound waves to create images of your heart. It provides your doctor with information about the size and shape of your heart and how well your heart's chambers and valves are working. This procedure takes approximately one hour. There are no restrictions for this procedure. December

## 2015-11-12 NOTE — Progress Notes (Signed)
Patient ID: Nathan Mcclure, male   DOB: [birth year], 64 y.o.   MRN: [id number]     HPI: Nathan Mcclure is a 64 y.o. male is in the office for a one year followup cardiology evaluation.  [name] has a history of mild CAD initially found in 2000. His last cardiac catheterization in May 2005 revealed  mild nonobstructive CAD involving the LAD, intermediate, and right coronary artery treated medically. He has a history of hyperlipidemia. He's been diagnosed with rheumatoid arthritis. He had done well with beta blocker therapy for many years but earlier this year .  Stopped taking this when his prescription had run out and he was seen as an add-on on 01/24/2014.  He was found to be in atrial fibrillation with ventricular rate at 110 beats per minute. He was restarted on low-dose metoprolol tartrate at 25 mg twice a day. Laboratory showed a TSH that was normal at 1.12. He had normal CMP profile. Creatinine was 0.85. He was started on Xarelto 20 mg with  normal hemoglobin and hematocrit. I further increased his metoprolol to 50 mg twice a day. An echo Doppler study  on 02/23/2014  showed an ejection fraction of 50-55%. There was mild to moderate mitral regurgitation. His left atrium measured 42 mm.  He was initially started on Propofenone150 mg every 8 hours and when attempted to be titrated to 225 mg 3 times a day h was unable to tolerate the increased dose of Propofenone due to nausea and reduced his dose back to 150 mg.   Due to concerns for significant obstructive sleep apnea with excessive daytime sleepiness he  underwent a sleep study which confirmed moderate sleep apnea with an AHI of 15.1.  He dropped his oxygen 87%.  There was heavy snoring.  He had a significant delay in ultimately undergoing a CPAP titration due to his canceling of the study, but ultimately a CPAP trial was done on 06/29/2014.  He was found to develop central apneic events in ultimately was switched to BiPAP.  He was titrated up  to an IPAP of 16 and EPAP of 12, but continues to have centrals during sleep-wake junctions.  He has an AirCurve 10 Auto ResMed BiPAP unit and his current settings are for minimum EPAP of 10 with a maximum IPAP of 20.  A download was obtained from October 16 through 11/04/2014.  He met compliance with days of use being 80% but days greater than 4 hours was only 27%.  He was averaging 3 hours and 43 minutes of use.  Upon further questioning, he states as the pressure would build up  he would develop significant leakage  around the seal in the mask and mask would essentially blow off his face.  For this reason, he has not been able to be compliant.  He brings his unit with him and mask to the office today for further evaluation and assessment. A review of his download indicates a significant increase leak with 95th sent.  I'll leak at 97.6.  His AHI was 13.4.  A sleepiness scale score today was calculated and this endorsed at 16, consistent with significant residual daytime sleepiness.  He also has had difficulty with humidification.    When I last saw him one year ago, we scheduled him for a cardioversion, which was successfully done  on 11/20/2014.  At that time, he converted with just 1 shock at 120 J to normal sinus rhythm.  Apparently, he never had a follow-up evaluation.  Over the past year, he has not been very compliant with his medications.  He tells me he is only intermittently using BiPAP proximally 2 times per week.  Incentive taking his metoprolol, tartrate 75 mg twice a day.  He has only been taking this 50 mg daily.  He has continued Xarelto 20 mg for anticoagulation.  2 days ago, he began to notice that he was feeling more chest fluttering.  He denied chest pain.  He denies PND or orthopnea.  He has not been using BiPAP.  He has been using [facility] for his DME company, but states he may need pressure adjustment.  He presents for evaluation.                                                                Past Surgical History  Procedure Laterality Date  . Cardiac catheterization  05/14/2004    Contiued medical therapy  . Cardiovascular stress test  09/19/2009    Observed defect is consistent with diaphragmatic attentuation. EKG negative for ischemia.  . Transthoracic echocardiogram  03/29/2008    EF 67%, mild mitral and tricuspid valve regurg.  . Cardioversion N/A 11/20/2014    Procedure: CARDIOVERSION;  Surgeon: Troy Sine, MD;  Location: [facility] ENDOSCOPY;  Service: Cardiovascular;  Laterality: N/A;    Allergies  Allergen Reactions  . Penicillins     Current Outpatient Prescriptions  Medication Sig Dispense Refill  . alfuzosin (UROXATRAL) 10 MG 24 hr tablet Take 1 tablet by mouth daily. Take 1 tab daily  0  . AXIRON 30 MG/ACT SOLN Apply 2 sprays topically daily.    . celecoxib (CELEBREX) 200 MG capsule Take 1 capsule by mouth 2 (two) times daily.     Marland Kitchen etanercept (ENBREL) 50 MG/ML injection Inject 50 mg into the skin once a week.    . fentaNYL (DURAGESIC - DOSED MCG/HR) 25 MCG/HR patch Place 1 patch onto the skin. Every 2 days    . fexofenadine (ALLEGRA) 180 MG tablet Take 180 mg by mouth daily.    . folic acid (FOLVITE) 1 MG tablet Take 1 mg by mouth daily. Take 1 tab daily  2  . Glucos-Chondroit-Hyaluron-MSM (GLUCOSAMINE CHONDROITIN JOINT PO) Take 2 tablets by mouth 2 (two) times daily.    Marland Kitchen LYRICA 75 MG capsule Take 1 capsule by mouth 2 (two) times daily.    . methotrexate 50 MG/2ML injection INJECT AS DIRECTED ONCE WEEKLY  2  . metoprolol (LOPRESSOR) 50 MG tablet Take 1.5 tablets (75 mg total) by mouth 2 (two) times daily. 180 tablet 6  . Multiple Vitamin (MULTIVITAMIN WITH MINERALS) TABS tablet Take 1 tablet by mouth daily.    Marland Kitchen oxyCODONE (OXY IR/ROXICODONE) 5 MG immediate release tablet Take 1 tablet by mouth as needed.    . sertraline (ZOLOFT) 50 MG tablet   0  . XARELTO 20 MG TABS tablet TAKE 1 TABLET BY MOUTH ONCE DAILY WITH SUPPER. NEED MD APPT FOR FURTHER  REFILLS 30 tablet 0  . rosuvastatin (CRESTOR) 10 MG tablet Take 1 tablet (10 mg total) by mouth daily. 90 tablet 3   No current facility-administered medications for this visit.    Social History   Social History  . Marital Status: Married    Spouse Name: N/A  .  Number of Children: N/A  . Years of Education: N/A   Occupational History  . Not on file.   Social History Main Topics  . Smoking status: Former Smoker    Types: Cigarettes    Quit date: 01/25/2004  . Smokeless tobacco: Never Used  . Alcohol Use: 2.0 oz/week    4 Standard drinks or equivalent per week  . Drug Use: Not on file  . Sexual Activity: Not on file   Other Topics Concern  . Not on file   Social History Narrative   Socially he is widowed for 3 years.  There is no tobacco use.  He is now engaged.  Family History  Problem Relation Age of Onset  . Diabetes Mother     ROS General: Negative; No fevers, chills, or night sweats;  HEENT: Negative; No changes in vision or hearing, sinus congestion, difficulty swallowing Pulmonary: Negative; No cough, wheezing, shortness of breath, hemoptysis Cardiovascular:  unaware of his atrial fibrillation; No chest pain, presyncope, syncope,  GI: Negative; No nausea, vomiting, diarrhea, or abdominal pain GU: Negative; No dysuria, hematuria, or difficulty voiding Musculoskeletal: Positive for cervical and lumbar disc disease; Hematologic/Oncology: Negative; no easy bruising, bleeding Endocrine: Negative; no heat/cold intolerance; no diabetes Neuro: Negative; no changes in balance, headaches Skin: Negative; No rashes or skin lesions Psychiatric: Negative; No behavioral problems, depression Sleep: Positive  For moderate complex obstructive sleep apnea, residual daytime sleepiness, hypersomnolence; negative for bruxism, restless legs, hypnogognic hallucinations, no cataplexy Other comprehensive 14 point system review is negative.  PE BP 140/98 mmHg  Pulse 101  Ht 6'  (1.829 m)  Wt 209 lb 1 oz (94.83 kg)  BMI 28.35 kg/m2  General: Alert, oriented, no distress.  Skin: normal turgor, no rashes HEENT: Normocephalic, atraumatic. Pupils round and reactive; sclera anicteric;no lid lag. Extraocular muscles intact;; no xanthelasmas. Nose without nasal septal hypertrophy Mouth/Parynx benign; Mallinpatti scale 3 Neck: No JVD, no carotid bruits; normal carotid upstroke Lungs: clear to ausculatation and percussion; no wheezing or rales Chest wall: no tenderness to palpitation Heart: Irregularly irregular with a ventricular rate in the 70's, s1 s2 normal; 1/6 systolic murmur;no diastolic murmur, rub thrills or heaves Abdomen: soft, nontender; no hepatosplenomehaly, BS+; abdominal aorta nontender and not dilated by palpation. Back: no CVA tenderness Pulses 2+ Extremities: no clubbing cyanosis or edema, Homan's sign negative  Neurologic: grossly nonfocal; cranial nerves grossly normal. Psychologic: normal affect and mood.  ECG (independently read by me): Atrial fibrillation with a rapid ventricular response at 10 1 bpm.  Nonspecific ST-T changes.  11/09/2014 ECG (independently read by me): Atrial fibrillation with controlled ventricular response in the 70s.  QTc interval 407 ms.  June 2015 ECG (independently read by me): Atrial fibrillation with a ventricular rate at 100 beats per minute.  QTc interval 428 ms.  Nonspecific T changes  05/08/2014 ECG (independently read by me): atrial fibrillation at 94 beats per minute.  QTc interval 425 ms.  02/23/2014 ECG (independently read by me) atrial fibrillation at 98 beats per minute. Nonspecific ST-T changes   Prior 02/08/2014 ECG (independently read by me): Atrial fibrillation with a rapid ventricular response at 128 beats per minute with nonspecific ST-T changes.   LABS: Recent laboratory from [facility] has been reviewed.  BMP Latest Ref Rng 11/15/2014 01/24/2014 10/04/2007  Glucose 70 - 99 mg/dL 142(H)  102(H) 102(H)  BUN 6 - 23 mg/dL 21 23 11   Creatinine 0.50 - 1.35 mg/dL 1.00 0.85 -  Sodium 135 - 145 mEq/L 143  140 142  Potassium 3.5 - 5.3 mEq/L 4.1 4.3 4.3  Chloride 96 - 112 mEq/L 109 105 107  CO2 19 - 32 mEq/L 25 25 -  Calcium 8.4 - 10.5 mg/dL 9.2 8.8 -   Hepatic Function Latest Ref Rng 11/15/2014 01/24/2014  Total Protein 6.0 - 8.3 g/dL 6.9 6.7  Albumin 3.5 - 5.2 g/dL 4.2 4.1  AST 0 - 37 U/L 22 23  ALT 0 - 53 U/L 21 21  Alk Phosphatase 39 - 117 U/L 60 66  Total Bilirubin 0.2 - 1.2 mg/dL 0.8 0.4   CBC Latest Ref Rng 11/15/2014 01/24/2014 10/04/2007  WBC 4.0 - 10.5 K/uL 6.0 6.7 -  Hemoglobin 13.0 - 17.0 g/dL 16.1 14.9 13.9  Hematocrit 39.0 - 52.0 % 47.5 43.3 41.0  Platelets 150 - 400 K/uL 212 244 -   Lab Results  Component Value Date   MCV 91.3 11/15/2014   MCV 88.0 01/24/2014   Lab Results  Component Value Date   TSH 1.120 01/24/2014  No results found for: HGBA1C  Lipid Panel  No results found for: CHOL, TRIG, HDL, CHOLHDL, VLDL, LDLCALC, LDLDIRECT  RADIOLOGY: No results found.    ASSESSMENT AND PLAN: Mr. Eunice Oldaker is a 64 year old gentleman who has mild nonobstructive coronary disease who developed atrial fibrillation after stopping his metoprolol last year.  An echo Doppler study confirms an ejection fraction of 50-55% with mild-to-moderate mitral regurgitation and mild biatrial enlargement.  He had been on propofenone and metoprolol in the past.    He has been on BiPAP auto unit for complex sleep apnea .  One year ago, he underwent successful DC cardioversion with restoration of sinus rhythm.  He apparently never returned for follow-up office visit and I'm uncertain if one had been definitively scheduled.  He states over the past year, he has only intermittently been using his BiPAP.  He believes that he is getting too high pressure.  Several days ago he began to notice a sensation of chest fluttering.  His ECG today confirms that he is back in atrial fibrillation of  questionable duration.  He certainly possible this may have just happened 2 days ago, but it is uncertain.  He is on anticoagulation with Xarelto and is tolerating this well.  His resting pulse is fast at 101 bpm.  He has only been taking metoprolol 50 mg daily.  I discussed with him the difference between metoprolol tartrate versus succinate.  He will take 75 mg of metoprolol, tartrate tonight and then 50 mg in the morning.  For the next several days he will be taking this dose regimen.  If his resting pulse is still fast he will then increase this to 75 mg twice a day.  Some of recommended he try generic Crestor since his most recent lipid panel done at [facility] revealed a total cholesterol of 241 with an LDL cholesterol of 170.  I also suggested coenzyme Q 10 supplementation.  I am scheduling him for follow-up echo Doppler study to be done in December at which time fasting laboratory will be obtained.  I discussed the importance of resuming his BiPAP use with 100% compliance.  He will contact his DME company for mask adjustment and potential reduction in his maximal pressure.  I will see him in the office in 4-6 weeks for reevaluation and further recommendations will be made at that time.  Time spent: 30 minutes Troy Sine, MD, [facility]  11/12/2015 6:22 PM

## 2015-11-28 ENCOUNTER — Other Ambulatory Visit (HOSPITAL_COMMUNITY): Payer: BLUE CROSS/BLUE SHIELD

## 2015-12-06 ENCOUNTER — Telehealth: Payer: Self-pay | Admitting: Cardiovascular Disease

## 2015-12-06 NOTE — Telephone Encounter (Signed)
Pt says he need an order so he can order sleep machine supplies.

## 2015-12-07 NOTE — Telephone Encounter (Signed)
Left message on personal voicemail that we have received a order for his CPAP supplies from [facility] however Dr Claiborne Billings has been working in the hospital all week. He will return on Monday. I will have him to sign the order and I will return it to [facility] once it has been signed.

## 2015-12-11 ENCOUNTER — Other Ambulatory Visit: Payer: Self-pay | Admitting: Cardiovascular Disease

## 2015-12-11 LAB — LIPID PANEL
CHOL/HDL RATIO: 3.2 ratio (ref <=5.0)
Cholesterol: 145 mg/dL (ref 125–200)
HDL: 45 mg/dL (ref >=40)
LDL Cholesterol: 85 mg/dL (ref <130)
Triglycerides: 77 mg/dL (ref <150)
VLDL: 15 mg/dL (ref <30)

## 2015-12-11 LAB — COMPLETE METABOLIC PANEL WITH GFR
ALT: 19 U/L (ref 9–46)
AST: 20 U/L (ref 10–35)
Albumin: 4.1 g/dL (ref 3.6–5.1)
Alkaline Phosphatase: 53 U/L (ref 40–115)
BUN: 24 mg/dL (ref 7–25)
CO2: 28 mmol/L (ref 20–31)
Calcium: 8.8 mg/dL (ref 8.6–10.3)
Chloride: 105 mmol/L (ref 98–110)
Creat: 0.85 mg/dL (ref 0.70–1.25)
GFR, Est African American: >89 mL/min (ref >=60)
GFR, Est Non African American: >89 mL/min (ref >=60)
GLUCOSE: 91 mg/dL (ref 65–99)
POTASSIUM: 4.4 mmol/L (ref 3.5–5.3)
SODIUM: 139 mmol/L (ref 135–146)
TOTAL PROTEIN: 6.7 g/dL (ref 6.1–8.1)
Total Bilirubin: 0.7 mg/dL (ref 0.2–1.2)

## 2015-12-11 LAB — TSH: TSH: 1.113 u[IU]/mL (ref 0.350–4.500)

## 2015-12-11 LAB — CBC
HCT: 39.4 % (ref 39.0–52.0)
Hemoglobin: 13.5 g/dL (ref 13.0–17.0)
MCH: 32.2 pg (ref 26.0–34.0)
MCHC: 34.3 g/dL (ref 30.0–36.0)
MCV: 94 fL (ref 78.0–100.0)
MPV: 10.2 fL (ref 8.6–12.4)
PLATELETS: 177 10*3/uL (ref 150–400)
RBC: 4.19 MIL/uL — AB (ref 4.22–5.81)
RDW: 14.5 % (ref 11.5–15.5)
WBC: 5.4 10*3/uL (ref 4.0–10.5)

## 2015-12-11 LAB — MAGNESIUM: MAGNESIUM: 1.7 mg/dL (ref 1.5–2.5)

## 2015-12-14 ENCOUNTER — Ambulatory Visit (INDEPENDENT_AMBULATORY_CARE_PROVIDER_SITE_OTHER): Payer: BLUE CROSS/BLUE SHIELD | Admitting: Cardiovascular Disease

## 2015-12-14 ENCOUNTER — Encounter: Payer: Self-pay | Admitting: Cardiovascular Disease

## 2015-12-14 VITALS — BP 104/80 | HR 90 | Ht 72.0 in | Wt 211.6 lb

## 2015-12-14 DIAGNOSIS — E785 Hyperlipidemia, unspecified: Secondary | ICD-10-CM

## 2015-12-14 DIAGNOSIS — Z7901 Long term (current) use of anticoagulants: Secondary | ICD-10-CM

## 2015-12-14 DIAGNOSIS — I2510 Atherosclerotic heart disease of native coronary artery without angina pectoris: Secondary | ICD-10-CM

## 2015-12-14 DIAGNOSIS — I481 Persistent atrial fibrillation: Secondary | ICD-10-CM | POA: Diagnosis not present

## 2015-12-14 DIAGNOSIS — I2583 Coronary atherosclerosis due to lipid rich plaque: Secondary | ICD-10-CM

## 2015-12-14 DIAGNOSIS — I10 Essential (primary) hypertension: Secondary | ICD-10-CM

## 2015-12-14 DIAGNOSIS — G4733 Obstructive sleep apnea (adult) (pediatric): Secondary | ICD-10-CM

## 2015-12-14 DIAGNOSIS — I4819 Other persistent atrial fibrillation: Secondary | ICD-10-CM

## 2015-12-14 MED ORDER — AMIODARONE HCL 200 MG PO TABS: ORAL_TABLET | ORAL | Status: DC

## 2015-12-14 NOTE — Patient Instructions (Signed)
Your physician has recommended you make the following change in your medication:   1.) start new prescription for amiodarone. This has been sent to your pharmacy. Take as directed on the bottle.  Your physician recommends that you schedule a follow-up appointment on January 26th with dr Claiborne Billings.

## 2015-12-14 NOTE — Progress Notes (Signed)
Patient ID: Nathan Mcclure, male   DOB: [birth year], 64 y.o.   MRN: [id number]     HPI: Nathan Mcclure is a 64 y.o. male who presents to the office for a one month followup cardiology evaluation.  [name] has a history of mild CAD initially found in 2000. His last cardiac catheterization in May 2005 revealed  mild nonobstructive CAD involving the LAD, intermediate, and right coronary artery treated medically. He has a history of hyperlipidemia. He's been diagnosed with rheumatoid arthritis. He had done well with beta blocker therapy for many years but last year stopped taking this when his prescription had run out and he was seen as an add-on on 01/24/2014.  He was found to be in atrial fibrillation with ventricular rate at 110 beats per minute. He was restarted on low-dose metoprolol tartrate at 25 mg twice a day. Laboratory showed a TSH that was normal at 1.12. He had normal CMP profile. Creatinine was 0.85. He was started on Xarelto 20 mg with  normal hemoglobin and hematocrit. I further increased his metoprolol to 50 mg twice a day. An echo Doppler study  on 02/23/2014  showed an ejection fraction of 50-55%. There was mild to moderate mitral regurgitation. His left atrium measured 42 mm.  He was initially started on Propofenone150 mg every 8 hours and when attempted to be titrated to 225 mg 3 times a day h was unable to tolerate the increased dose of Propofenone due to nausea and reduced his dose back to 150 mg.   Due to concerns for significant obstructive sleep apnea with excessive daytime sleepiness he  underwent a sleep study which confirmed moderate sleep apnea with an AHI of 15.1.  He dropped his oxygen 87%.  There was heavy snoring.  He had a significant delay in ultimately undergoing a CPAP titration due to his canceling of the study, but ultimately a CPAP trial was done on 06/29/2014.  He was found to develop central apneic events in ultimately was switched to BiPAP.  He was titrated up  to an IPAP of 16 and EPAP of 12, but continues to have centrals during sleep-wake junctions.  He has an AirCurve 10 Auto ResMed BiPAP unit and his current settings are for minimum EPAP of 10 with a maximum IPAP of 20.  A download was obtained from October 16 through 11/04/2014.  He met compliance with days of use being 80% but days greater than 4 hours was only 27%.  He was averaging 3 hours and 43 minutes of use.  Upon further questioning, he states as the pressure would build up  he would develop significant leakage  around the seal in the mask and mask would essentially blow off his face.  For this reason, he has not been able to be compliant.  He brings his unit with him and mask to the office today for further evaluation and assessment. A review of his download indicates a significant increase leak with 95th sent.  I'll leak at 97.6.  His AHI was 13.4.  When I  saw him one year ago, we scheduled him for a cardioversion, which was successfully done  on 11/20/2014.  At that time, he converted with just 1 shock at 120 J to normal sinus rhythm.  Apparently, he never had a follow-up evaluation.  Over the past year, he has not been very compliant with his medications.  He was only intermittently using BiPAP 2 times per week.  Instead of  taking his  metoprolol tartrate 75 mg twice a day he was only taking this 50 mg daily.  He has continued Xarelto 20 mg for anticoagulation.  He began to notice that he was feeling more chest fluttering.  He denied chest pain.  He denies PND or orthopnea.  He has not been using BiPAP.  He has been using [facility] for his [employer].  When I saw him one month ago, he was in atrial fibrillation of questionable duration. His resting pulse was 101 and I increased his metoprolol to 75 mg twice a day. He has felt improved. He also has resumed using BiPAP therapy and admits that he feels so much better now that he has been using this on a daily basis.  He presents for  reevaluation.  Past Surgical History  Procedure Laterality Date  . Cardiac catheterization  05/14/2004    Contiued medical therapy  . Cardiovascular stress test  09/19/2009    Observed defect is consistent with diaphragmatic attentuation. EKG negative for ischemia.  . Transthoracic echocardiogram  03/29/2008    EF 67%, mild mitral and tricuspid valve regurg.  . Cardioversion N/A 11/20/2014    Procedure: CARDIOVERSION;  Surgeon: Troy Sine, MD;  Location: [facility] ENDOSCOPY;  Service: Cardiovascular;  Laterality: N/A;    Allergies  Allergen Reactions  . Penicillins     Current Outpatient Prescriptions  Medication Sig Dispense Refill  . alfuzosin (UROXATRAL) 10 MG 24 hr tablet Take 1 tablet by mouth daily. Take 1 tab daily  0  . AXIRON 30 MG/ACT SOLN Apply 2 sprays topically daily.    . celecoxib (CELEBREX) 200 MG capsule Take 1 capsule by mouth daily.    Marland Kitchen etanercept (ENBREL) 50 MG/ML injection Inject 50 mg into the skin once a week.    . fentaNYL (DURAGESIC - DOSED MCG/HR) 25 MCG/HR patch Place 1 patch onto the skin. Every 2 days    . fexofenadine (ALLEGRA) 180 MG tablet Take 180 mg by mouth daily.    . Glucos-Chondroit-Hyaluron-MSM (GLUCOSAMINE CHONDROITIN JOINT PO) Take 2 tablets by mouth 2 (two) times daily.    Marland Kitchen LYRICA 75 MG capsule Take 1 capsule by mouth 2 (two) times daily.    . metoprolol (LOPRESSOR) 50 MG tablet Take 1.5 tablets (75 mg total) by mouth 2 (two) times daily. 180 tablet 6  . Multiple Vitamin (MULTIVITAMIN WITH MINERALS) TABS tablet Take 1 tablet by mouth daily.    Marland Kitchen oxyCODONE (OXY IR/ROXICODONE) 5 MG immediate release tablet Take 1 tablet by mouth as needed.    . rivaroxaban (XARELTO) 20 MG TABS tablet Take 1 tablet (20 mg total) by mouth daily with supper. 30 tablet 5  . rosuvastatin (CRESTOR) 10 MG tablet Take 1 tablet (10 mg total) by mouth daily. 90 tablet 3  . sertraline (ZOLOFT) 50 MG tablet   0  . amiodarone (PACERONE) 200 MG tablet Take 1 tablet daily for 1  week then increase to 1 tablet twice a day 60 tablet 6   No current facility-administered medications for this visit.    Social History   Social History  . Marital Status: Married    Spouse Name: N/A  . Number of Children: N/A  . Years of Education: N/A   Occupational History  . Not on file.   Social History Main Topics  . Smoking status: Former Smoker    Types: Cigarettes    Quit date: 01/25/2004  . Smokeless tobacco: Never Used  . Alcohol Use: 2.0 oz/week  4 Standard drinks or equivalent per week  . Drug Use: Not on file  . Sexual Activity: Not on file   Other Topics Concern  . Not on file   Social History Narrative   Socially he is widowed for 3 years.  There is no tobacco use.  He is now engaged.  Family History  Problem Relation Age of Onset  . Diabetes Mother     ROS General: Negative; No fevers, chills, or night sweats;  HEENT: Negative; No changes in vision or hearing, sinus congestion, difficulty swallowing Pulmonary: Negative; No cough, wheezing, shortness of breath, hemoptysis Cardiovascular:  unaware of his atrial fibrillation; No chest pain, presyncope, syncope,  GI: Negative; No nausea, vomiting, diarrhea, or abdominal pain GU: Negative; No dysuria, hematuria, or difficulty voiding Musculoskeletal: Positive for cervical and lumbar disc disease; Hematologic/Oncology: Negative; no easy bruising, bleeding Endocrine: Negative; no heat/cold intolerance; no diabetes Neuro: Negative; no changes in balance, headaches Skin: Negative; No rashes or skin lesions Psychiatric: Negative; No behavioral problems, depression Sleep: Positive for moderate complex obstructive sleep apnea, residual daytime sleepiness, hypersomnolence; negative for bruxism, restless legs, hypnogognic hallucinations, no cataplexy Other comprehensive 14 point system review is negative.  PE BP 104/80 mmHg  Pulse 90  Ht 6' (1.829 m)  Wt 211 lb 9.6 oz (95.981 kg)  BMI 28.69 kg/m2   Wt  Readings from Last 3 Encounters:  12/14/15 211 lb 9.6 oz (95.981 kg)  11/12/15 209 lb 1 oz (94.83 kg)  11/02/15 205 lb (92.987 kg)   General: Alert, oriented, no distress.  Skin: normal turgor, no rashes HEENT: Normocephalic, atraumatic. Pupils round and reactive; sclera anicteric;no lid lag. Extraocular muscles intact;; no xanthelasmas. Nose without nasal septal hypertrophy Mouth/Parynx benign; Mallinpatti scale 3 Neck: No JVD, no carotid bruits; normal carotid upstroke Lungs: clear to ausculatation and percussion; no wheezing or rales Chest wall: no tenderness to palpitation Heart: Irregularly irregular with a ventricular rate at 90, s1 s2 normal; 1/6 systolic murmur;no diastolic murmur, rub thrills or heaves Abdomen: soft, nontender; no hepatosplenomehaly, BS+; abdominal aorta nontender and not dilated by palpation. Back: no CVA tenderness Pulses 2+ Extremities: no clubbing cyanosis or edema, Homan's sign negative  Neurologic: grossly nonfocal; cranial nerves grossly normal. Psychologic: normal affect and mood.  ECG (independently read by me):  Atrial fibrillation at 90 bpm.  Nonspecific T changes.  QTc interval 442 ms.  November 12 2015 ECG (independently read by me): Atrial fibrillation with a rapid ventricular response at 10 1 bpm.  Nonspecific ST-T changes.  11/09/2014 ECG (independently read by me): Atrial fibrillation with controlled ventricular response in the 70s.  QTc interval 407 ms.  June 2015 ECG (independently read by me): Atrial fibrillation with a ventricular rate at 100 beats per minute.  QTc interval 428 ms.  Nonspecific T changes  05/08/2014 ECG (independently read by me): atrial fibrillation at 94 beats per minute.  QTc interval 425 ms.  02/23/2014 ECG (independently read by me) atrial fibrillation at 98 beats per minute. Nonspecific ST-T changes   Prior 02/08/2014 ECG (independently read by me): Atrial fibrillation with a rapid ventricular response at 128 beats  per minute with nonspecific ST-T changes.   LABS: Recent laboratory from [facility] has been reviewed.  BMP Latest Ref Rng 12/10/2015 11/15/2014 01/24/2014  Glucose 65 - 99 mg/dL 91 142(H) 102(H)  BUN 7 - 25 mg/dL _0 Creatinine 0.70 - 1.25 mg/dL 0.85 1.00 0.85  Sodium 135 - 146 mmol/L 139 143 140  Potassium  3.5 - 5.3 mmol/L 4.4 4.1 4.3  Chloride 98 - 110 mmol/L 105 109 105  CO2 20 - 31 mmol/L _0 Calcium 8.6 - 10.3 mg/dL 8.8 9.2 8.8   Hepatic Function Latest Ref Rng 12/10/2015 11/15/2014 01/24/2014  Total Protein 6.1 - 8.1 g/dL 6.7 6.9 6.7  Albumin 3.6 - 5.1 g/dL 4.1 4.2 4.1  AST 10 - 35 U/L _1 ALT 9 - 46 U/L _2 Alk Phosphatase 40 - 115 U/L 53 60 66  Total Bilirubin 0.2 - 1.2 mg/dL 0.7 0.8 0.4   CBC Latest Ref Rng 12/10/2015 11/15/2014 01/24/2014  WBC 4.0 - 10.5 K/uL 5.4 6.0 6.7  Hemoglobin 13.0 - 17.0 g/dL 13.5 16.1 14.9  Hematocrit 39.0 - 52.0 % 39.4 47.5 43.3  Platelets 150 - 400 K/uL 177 212 244   Lab Results  Component Value Date   MCV 94.0 12/10/2015   MCV 91.3 11/15/2014   MCV 88.0 01/24/2014   Lab Results  Component Value Date   TSH 1.113 12/10/2015  No results found for: HGBA1C  Lipid Panel     Component Value Date/Time   CHOL 145 12/10/2015 1330   TRIG 77 12/10/2015 1330   HDL 45 12/10/2015 1330   CHOLHDL 3.2 12/10/2015 1330   VLDL 15 12/10/2015 1330   LDLCALC 85 12/10/2015 1330    RADIOLOGY: No results found.    ASSESSMENT AND PLAN: Mr. Jaquari Reckner is a 64 year old gentleman who has mild nonobstructive coronary disease. He developed atrial fibrillation after stopping his metoprolol last year.  An echo Doppler study confirms an ejection fraction of 50-55% with mild-to-moderate mitral regurgitation and mild biatrial enlargement. Remotely he had been on propofenone and metoprolol.   He has been on BiPAP auto unit for complex sleep apnea .  One year ago, he underwent successful DC cardioversion with restoration of sinus  rhythm.  He apparently never returned for follow-up office visit.  When I saw him one month ago after not having seen him in a year he was in atrial fibrillation. He had only been very intermittently using BiPAP therapy. At that time, I further titrated his beta blocker therapy and recommended reinstitution of daily BiPAP therapy.  His sleep pattern has improved and he has more energy now that he is sleeping better using treatment. Days where he has fallen asleep on a recliner and never used treatment. He is on anticoagulation with Xarelto 20 mg and denies bleeding. I have now recommended reinstitution of a anti-arrhythmic therapy and will start him on amiodarone 200 mg daily for 1 week and then he will titrate this to 200 mg twice a day as long as his pulse is stable.  If his resting pulse gets below 60 metoprolol dose will be reduced.  I will see him in one month for follow-up evaluation.  If he is still in atrial fibrillation.  We will then make plans to consider outpatient DC cardioversion.  Time spent: 30 minutes Troy Sine, MD, [facility]  12/14/2015 2:27 PM

## 2015-12-27 ENCOUNTER — Ambulatory Visit: Payer: BLUE CROSS/BLUE SHIELD | Admitting: Podiatry

## 2016-01-04 ENCOUNTER — Telehealth: Payer: Self-pay | Admitting: *Deleted

## 2016-01-04 NOTE — Telephone Encounter (Signed)
Faxed CPAP order to [email or web address]

## 2016-01-17 ENCOUNTER — Telehealth: Payer: Self-pay | Admitting: Cardiovascular Disease

## 2016-01-17 ENCOUNTER — Ambulatory Visit: Payer: BLUE CROSS/BLUE SHIELD | Admitting: Cardiovascular Disease

## 2016-01-17 NOTE — Telephone Encounter (Signed)
Returned call to patient. States no acute concerns, wanted re-visit w/ Dr. Claiborne Billings. Had to cancel today's appt and cannot make other available times I offered. He does not want to see PA. He does not want to wait until April to be seen or schedule April appt.  Pt added to Dr. Evette Georges schedule waitlist. Pt instructed to call if new concerns.

## 2016-01-17 NOTE — Telephone Encounter (Signed)
Pt back in [place] made for today=pt states he called yesterday to cxl and rs but the computer's were down. Dr. Claiborne Billings is booked until March, offered APP-but wants to talk to nurse to hopefully get worked in for Dr. Claiborne Billings. pls call cell [phone]

## 2016-02-04 ENCOUNTER — Telehealth: Payer: Self-pay | Admitting: Cardiovascular Disease

## 2016-02-04 NOTE — Telephone Encounter (Signed)
New message ° °Pt called back  °

## 2016-02-04 NOTE — Telephone Encounter (Signed)
Returned pt call - pt c/o red spot, silver dollar sized, w some unusual color & pain to touch, on left calf.  Pt to be seen on Thursday by RB.  In interim, pt will have primary care examine and see if they can address. Pt does not note problems w/ PAD.  Marland Kitchen Had noted concern for blood clot, but he is currently on Xarelto and compliant w this. Pt aware to call if new/worse problems.

## 2016-02-04 NOTE — Telephone Encounter (Signed)
No answer when dialed. 

## 2016-02-04 NOTE — Telephone Encounter (Signed)
Pt called in stating that he has been having some problems with his rt leg. He is complaining of pain and discoloration. Please f/u with him. He wanted to be seen sooner than 2/16. Please f/u with him   Thanks

## 2016-02-07 ENCOUNTER — Encounter: Payer: Self-pay | Admitting: Cardiovascular Disease

## 2016-02-07 ENCOUNTER — Ambulatory Visit (INDEPENDENT_AMBULATORY_CARE_PROVIDER_SITE_OTHER): Payer: BLUE CROSS/BLUE SHIELD | Admitting: Physician Assistant

## 2016-02-07 ENCOUNTER — Encounter: Payer: Self-pay | Admitting: Physician Assistant

## 2016-02-07 VITALS — BP 118/84 | HR 76 | Ht 72.0 in | Wt 212.0 lb

## 2016-02-07 DIAGNOSIS — Z7901 Long term (current) use of anticoagulants: Secondary | ICD-10-CM

## 2016-02-07 DIAGNOSIS — I481 Persistent atrial fibrillation: Secondary | ICD-10-CM | POA: Diagnosis not present

## 2016-02-07 DIAGNOSIS — Z01818 Encounter for other preprocedural examination: Secondary | ICD-10-CM | POA: Diagnosis not present

## 2016-02-07 DIAGNOSIS — D689 Coagulation defect, unspecified: Secondary | ICD-10-CM | POA: Diagnosis not present

## 2016-02-07 DIAGNOSIS — I480 Paroxysmal atrial fibrillation: Secondary | ICD-10-CM | POA: Insufficient documentation

## 2016-02-07 DIAGNOSIS — I4819 Other persistent atrial fibrillation: Secondary | ICD-10-CM

## 2016-02-07 NOTE — Progress Notes (Signed)
Cardiology Office Note   Date:  02/07/2016   ID:  DRAKKAR SERGIO, DOB [birth year], MRN [id number]  PCP:  Horatio Pel, MD  Cardiologist:  Dr Meredeth Ide, PA-C   Chief Complaint  Patient presents with  . Follow-up    no swelling, no chest pain, no shortness of breath, tightness in legs, some dizziness    History of Present Illness: Nathan Mcclure is a 65 y.o. male with a history of cath 2005 non-obs dz, HTN, HLD, RA, OSA on BiPAP, AFIB on Xarelto s/p DCCV 10/2014, EF 50-55% by echo 2015 w/ mild-mod MR, LA 42 mm.  12/23 seen in office and started on amio, f/u in 1 mo and sch DCCV if still in afib.  Nathan Mcclure presents for followup. He feels tired all the time. He has no chest pain and is not SOB. He got a little light-headed bending over the other day, otherwise, no presyncope. He denies orthopnea, PND or LE edema. He developed a knot on his medial mid-tibia that is a little tender. He is not aware of any trauma. He had some small areas of erythema on his lower leg that are resolving. No other changes or concerns.   His BiPAP is bothering him. The mask is blowing so much air, it seems to push it right off his face. He is compIiant, but struggling.  He is very interested in pursuing DCCV because he is very symptomatic from the afib. He has not missed any doses of his Xarelto or amiodarone.   Past Medical History  Diagnosis Date  . Coronary artery disease   . Depression   . Low testosterone   . Palpitations   . Hypertension   . Hyperlipidemia     Past Surgical History  Procedure Laterality Date  . Cardiac catheterization  05/14/2004    Contiued medical therapy  . Cardiovascular stress test  09/19/2009    Observed defect is consistent with diaphragmatic attentuation. EKG negative for ischemia.  . Transthoracic echocardiogram  03/29/2008    EF 67%, mild mitral and tricuspid valve regurg.  . Cardioversion N/A 11/20/2014    Procedure: CARDIOVERSION;   Surgeon: Troy Sine, MD;  Location: [facility] ENDOSCOPY;  Service: Cardiovascular;  Laterality: N/A;    Current Outpatient Prescriptions  Medication Sig Dispense Refill  . alfuzosin (UROXATRAL) 10 MG 24 hr tablet Take 1 tablet by mouth daily. Take 1 tab daily  0  . amiodarone (PACERONE) 200 MG tablet Take 1 tablet daily for 1 week then increase to 1 tablet twice a day 60 tablet 6  . AXIRON 30 MG/ACT SOLN Apply 2 sprays topically daily.    . celecoxib (CELEBREX) 200 MG capsule Take 1 capsule by mouth daily.    Marland Kitchen etanercept (ENBREL) 50 MG/ML injection Inject 50 mg into the skin once a week.    . fentaNYL (DURAGESIC - DOSED MCG/HR) 25 MCG/HR patch Place 1 patch onto the skin. Every 2 days    . fexofenadine (ALLEGRA) 180 MG tablet Take 180 mg by mouth daily.    . Glucos-Chondroit-Hyaluron-MSM (GLUCOSAMINE CHONDROITIN JOINT PO) Take 2 tablets by mouth 2 (two) times daily.    Marland Kitchen LYRICA 75 MG capsule Take 1 capsule by mouth 2 (two) times daily.    . metoprolol (LOPRESSOR) 50 MG tablet Take 1.5 tablets (75 mg total) by mouth 2 (two) times daily. 180 tablet 6  . Multiple Vitamin (MULTIVITAMIN WITH MINERALS) TABS tablet Take 1 tablet by mouth daily.    Marland Kitchen  oxyCODONE (OXY IR/ROXICODONE) 5 MG immediate release tablet Take 1 tablet by mouth as needed.    . rivaroxaban (XARELTO) 20 MG TABS tablet Take 1 tablet (20 mg total) by mouth daily with supper. 30 tablet 5  . rosuvastatin (CRESTOR) 10 MG tablet Take 1 tablet (10 mg total) by mouth daily. 90 tablet 3  . sertraline (ZOLOFT) 50 MG tablet   0   No current facility-administered medications for this visit.    Allergies:   Penicillins    Social History:  The patient  reports that he quit smoking about 12 years ago. His smoking use included Cigarettes. He has never used smokeless tobacco. He reports that he drinks about 2.0 oz of alcohol per week.   Family History:  The patient's family history includes Diabetes in his mother.    ROS:  Please see the  history of present illness. All other systems are reviewed and negative.    PHYSICAL EXAM: VS:  BP 118/84 mmHg  Pulse 76  Ht 6' (1.829 m)  Wt 212 lb (96.163 kg)  BMI 28.75 kg/m2 , BMI Body mass index is 28.75 kg/(m^2). GEN: Well nourished, well developed, male in no acute distress HEENT: normal for age  Neck: no JVD, no carotid bruit, no masses Cardiac: Irreg R&R; no murmur, no rubs, or gallops Respiratory:  clear to auscultation bilaterally, normal work of breathing GI: soft, nontender, nondistended, + BS MS: no deformity or atrophy; no edema; distal pulses are 2+ in all 4 extremities  Skin: warm and dry, no rash Neuro:  Strength and sensation are intact Psych: euthymic mood, full affect   EKG:  EKG is ordered today. The ekg ordered today demonstrates Atrial fibrillation, controlled VR, HR 69   Recent Labs: 12/10/2015: ALT 19; BUN 24; Creat 0.85; Hemoglobin 13.5; Magnesium 1.7; Platelets 177; Potassium 4.4; Sodium 139; TSH 1.113    Lipid Panel    Component Value Date/Time   CHOL 145 12/10/2015 1330   TRIG 77 12/10/2015 1330   HDL 45 12/10/2015 1330   CHOLHDL 3.2 12/10/2015 1330   VLDL 15 12/10/2015 1330   LDLCALC 85 12/10/2015 1330     Wt Readings from Last 3 Encounters:  02/07/16 212 lb (96.163 kg)  12/14/15 211 lb 9.6 oz (95.981 kg)  11/12/15 209 lb 1 oz (94.83 kg)     Other studies Reviewed: Additional studies/ records that were reviewed today include:  Previous office notes and testing.  ASSESSMENT AND PLAN:  1.  Atrial fibrillation: He is symptomatic from the atrial fibrillation despite a normal heart rate. He is not having ischemic symptoms and has no signs or symptoms of volume overload. He has been compliant with his amiodarone. Direct current cardioversion will be arranged as an outpatient. If he wishes to wait until Dr. Claiborne Billings is available, Dr. Claiborne Billings will do it. We will check labs and a chest x-ray today.   At the time of his cardioversion, consider  decreasing his amiodarone to 200 mg daily.   2. Chronic anticoagulation: CHADSVASC=2 (HTN & CAD). He has been compliant with the Xarelto, not missed any doses.    Current medicines are reviewed at length with the patient today.  The patient does not have concerns regarding medicines.  The following changes have been made:  no change  Labs/ tests ordered today include:   Orders Placed This Encounter  Procedures  . ELECTRICAL CARDIOVERSION  . DG Chest 2 View  . APTT  . Basic metabolic panel  . Protime-INR  .  CBC  . EKG 12-Lead     Disposition:   FU with Dr. Claiborne Billings   Signed, Lenoard Aden  02/07/2016 10:38 AM    [address], [place], [place]  [address] Phone: [phone]; Fax: [phone]

## 2016-02-07 NOTE — Patient Instructions (Signed)
Your physician has recommended that you have a Cardioversion (DCCV). Electrical Cardioversion uses a jolt of electricity to your heart either through paddles or wired patches attached to your chest. This is a controlled, usually prescheduled, procedure. Defibrillation is done under light anesthesia in the hospital, and you usually go home the day of the procedure. This is done to get your heart back into a normal rhythm. You are not awake for the procedure. Please see the instruction sheet given to you today.  Your physician recommends that you return for lab work within 7 days of the procedure.  A chest x-ray takes a picture of the organs and structures inside the chest, including the heart, lungs, and blood vessels. This test can show several things, including, whether the heart is enlarges; whether fluid is building up in the lungs; and whether pacemaker / defibrillator leads are still in place.   Your physician recommends that you schedule a follow-up appointment in: 2 weeks after the procedure.

## 2016-02-12 ENCOUNTER — Other Ambulatory Visit: Payer: Self-pay | Admitting: *Deleted

## 2016-02-12 DIAGNOSIS — Z01818 Encounter for other preprocedural examination: Secondary | ICD-10-CM

## 2016-02-13 ENCOUNTER — Ambulatory Visit
Admission: RE | Admit: 2016-02-13 | Discharge: 2016-02-13 | Disposition: A | Discharge status: Home / Self Care | Payer: BLUE CROSS/BLUE SHIELD | Source: Ambulatory Visit | Attending: Physician Assistant | Admitting: Physician Assistant

## 2016-02-13 ENCOUNTER — Telehealth: Payer: Self-pay | Admitting: Cardiovascular Disease

## 2016-02-13 DIAGNOSIS — Z01818 Encounter for other preprocedural examination: Secondary | ICD-10-CM

## 2016-02-13 LAB — PROTIME-INR
INR: 1.24 (ref <1.50)
Prothrombin Time: 15.8 seconds — ABNORMAL HIGH (ref 11.6–15.2)

## 2016-02-13 LAB — APTT: aPTT: 31 seconds (ref 24–37)

## 2016-02-13 NOTE — Telephone Encounter (Signed)
LM w instructions to go to [facility] on [address].

## 2016-02-13 NOTE — Telephone Encounter (Signed)
New Message  Pt called states that he was supposed to have and X-ray and blood work for cardioversion. Pt states that he will visit [facility] for the lab but he is not sure where is supposed to go for the X-ray. He is in town and would like to have it all completed today. Please call back to discuss. ( pt also states that he may come to [facility] office to find out for himself. )

## 2016-02-14 LAB — BASIC METABOLIC PANEL
BUN: 18 mg/dL (ref 7–25)
CHLORIDE: 106 mmol/L (ref 98–110)
CO2: 27 mmol/L (ref 20–31)
Calcium: 8.7 mg/dL (ref 8.6–10.3)
Creat: 0.99 mg/dL (ref 0.70–1.25)
GLUCOSE: 73 mg/dL (ref 65–99)
POTASSIUM: 4.2 mmol/L (ref 3.5–5.3)
SODIUM: 144 mmol/L (ref 135–146)

## 2016-02-14 LAB — CBC
HEMATOCRIT: 44.6 % (ref 39.0–52.0)
HEMOGLOBIN: 14.8 g/dL (ref 13.0–17.0)
MCH: 31.4 pg (ref 26.0–34.0)
MCHC: 33.2 g/dL (ref 30.0–36.0)
MCV: 94.7 fL (ref 78.0–100.0)
MPV: 10.6 fL (ref 8.6–12.4)
Platelets: 202 10*3/uL (ref 150–400)
RBC: 4.71 MIL/uL (ref 4.22–5.81)
RDW: 13.1 % (ref 11.5–15.5)
WBC: 6.4 10*3/uL (ref 4.0–10.5)

## 2016-02-19 ENCOUNTER — Ambulatory Visit (HOSPITAL_COMMUNITY): Payer: BLUE CROSS/BLUE SHIELD | Admitting: Certified Registered Nurse Anesthetist

## 2016-02-19 ENCOUNTER — Ambulatory Visit (HOSPITAL_COMMUNITY)
Admission: RE | Admit: 2016-02-19 | Discharge: 2016-02-19 | Disposition: A | Discharge status: Home / Self Care | Payer: BLUE CROSS/BLUE SHIELD | Source: Ambulatory Visit | Attending: Cardiovascular Disease | Admitting: Cardiovascular Disease

## 2016-02-19 ENCOUNTER — Encounter (HOSPITAL_COMMUNITY): Payer: Self-pay | Admitting: Certified Registered Nurse Anesthetist

## 2016-02-19 ENCOUNTER — Encounter (HOSPITAL_COMMUNITY)
Admission: RE | Disposition: A | Discharge status: Home / Self Care | Payer: Self-pay | Source: Ambulatory Visit | Attending: Cardiovascular Disease

## 2016-02-19 DIAGNOSIS — F329 Major depressive disorder, single episode, unspecified: Secondary | ICD-10-CM | POA: Diagnosis not present

## 2016-02-19 DIAGNOSIS — Z87891 Personal history of nicotine dependence: Secondary | ICD-10-CM | POA: Insufficient documentation

## 2016-02-19 DIAGNOSIS — Z7901 Long term (current) use of anticoagulants: Secondary | ICD-10-CM | POA: Insufficient documentation

## 2016-02-19 DIAGNOSIS — I10 Essential (primary) hypertension: Secondary | ICD-10-CM | POA: Insufficient documentation

## 2016-02-19 DIAGNOSIS — G4733 Obstructive sleep apnea (adult) (pediatric): Secondary | ICD-10-CM | POA: Insufficient documentation

## 2016-02-19 DIAGNOSIS — I481 Persistent atrial fibrillation: Secondary | ICD-10-CM

## 2016-02-19 DIAGNOSIS — E785 Hyperlipidemia, unspecified: Secondary | ICD-10-CM | POA: Insufficient documentation

## 2016-02-19 DIAGNOSIS — Z79899 Other long term (current) drug therapy: Secondary | ICD-10-CM | POA: Insufficient documentation

## 2016-02-19 DIAGNOSIS — Z01818 Encounter for other preprocedural examination: Secondary | ICD-10-CM

## 2016-02-19 DIAGNOSIS — I4891 Unspecified atrial fibrillation: Secondary | ICD-10-CM | POA: Insufficient documentation

## 2016-02-19 DIAGNOSIS — I2510 Atherosclerotic heart disease of native coronary artery without angina pectoris: Secondary | ICD-10-CM | POA: Diagnosis not present

## 2016-02-19 DIAGNOSIS — M069 Rheumatoid arthritis, unspecified: Secondary | ICD-10-CM | POA: Diagnosis not present

## 2016-02-19 DIAGNOSIS — I4819 Other persistent atrial fibrillation: Secondary | ICD-10-CM | POA: Insufficient documentation

## 2016-02-19 HISTORY — PX: CARDIOVERSION: SHX1299

## 2016-02-19 SURGERY — CARDIOVERSION
Anesthesia: Monitor Anesthesia Care

## 2016-02-19 MED ORDER — SODIUM CHLORIDE 0.9 % IV SOLN: INTRAVENOUS | Status: DC

## 2016-02-19 MED ORDER — LIDOCAINE HCL (CARDIAC) 20 MG/ML IV SOLN
INTRAVENOUS | Status: DC | PRN
  Administered 2016-02-19: 60 mg via INTRAVENOUS

## 2016-02-19 MED ORDER — HYDROCORTISONE 1 % EX CREA: 1.0000 "application " | TOPICAL_CREAM | Freq: Two times a day (BID) | CUTANEOUS | Status: DC

## 2016-02-19 MED ORDER — SODIUM CHLORIDE 0.9% FLUSH: 3.0000 mL | Freq: Two times a day (BID) | INTRAVENOUS | Status: DC

## 2016-02-19 MED ORDER — SODIUM CHLORIDE 0.9% FLUSH: 3.0000 mL | INTRAVENOUS | Status: DC | PRN

## 2016-02-19 MED ORDER — PROPOFOL 10 MG/ML IV BOLUS
INTRAVENOUS | Status: DC | PRN
  Administered 2016-02-19: 50 mg via INTRAVENOUS
  Administered 2016-02-19 (×2): 25 mg via INTRAVENOUS

## 2016-02-19 MED ORDER — LIDOCAINE HCL (CARDIAC) 20 MG/ML IV SOLN: INTRAVENOUS | Status: DC | PRN

## 2016-02-19 MED ORDER — HYDROCORTISONE 1 % EX CREA: 1.0000 "application " | TOPICAL_CREAM | Freq: Three times a day (TID) | CUTANEOUS | Status: DC | PRN

## 2016-02-19 MED ORDER — SODIUM CHLORIDE 0.9 % IV SOLN: 250.0000 mL | INTRAVENOUS | Status: DC

## 2016-02-19 NOTE — CV Procedure (Signed)
  CARDIOVERSION NOTE   Procedure: Electrical Cardioversion Indications:  Atrial Fibrillation  Procedure Details:  Consent: Risks of procedure as well as the alternatives and risks of each were explained to the (patient/caregiver).  Consent for procedure obtained.  Time Out: Verified patient identification, verified procedure, site/side was marked, verified correct patient position, special equipment/implants available, medications/allergies/relevent history reviewed, required imaging and test results available.  Performed  Patient placed on cardiac monitor, pulse oximetry, supplemental oxygen as necessary.  Sedation given: Propofol 100 mg and lidocaine 60 mg Pacer pads placed anterior and posterior chest.  Cardioverted 3 time(s).  Cardioverted at 120J, 150 J and 200 J Evaluation: Findings: Post procedure EKG shows: Sinus Bradycardia Complications: None Patient did tolerate procedure well.   Troy Sine, MD, [facility] 02/19/2016 1:22 PM

## 2016-02-19 NOTE — H&P (View-Only) (Signed)
Cardiology Office Note   Date:  02/07/2016   ID:  Nathan Mcclure, DOB 10/10/1951, MRN [id number]  PCP:  Horatio Pel, MD  Cardiologist:  Dr Meredeth Ide, PA-C   Chief Complaint  Patient presents with  . Follow-up    no swelling, no chest pain, no shortness of breath, tightness in legs, some dizziness    History of Present Illness: Nathan Mcclure is a 65 y.o. male with a history of cath 2005 non-obs dz, HTN, HLD, RA, OSA on BiPAP, AFIB on Xarelto s/p DCCV 10/2014, EF 50-55% by echo 2015 w/ mild-mod MR, LA 42 mm.  12/23 seen in office and started on amio, f/u in 1 mo and sch DCCV if still in afib.  Nathan Mcclure presents for followup. He feels tired all the time. He has no chest pain and is not SOB. He got a little light-headed bending over the other day, otherwise, no presyncope. He denies orthopnea, PND or LE edema. He developed a knot on his medial mid-tibia that is a little tender. He is not aware of any trauma. He had some small areas of erythema on his lower leg that are resolving. No other changes or concerns.   His BiPAP is bothering him. The mask is blowing so much air, it seems to push it right off his face. He is compIiant, but struggling.  He is very interested in pursuing DCCV because he is very symptomatic from the afib. He has not missed any doses of his Xarelto or amiodarone.   Past Medical History  Diagnosis Date  . Coronary artery disease   . Depression   . Low testosterone   . Palpitations   . Hypertension   . Hyperlipidemia     Past Surgical History  Procedure Laterality Date  . Cardiac catheterization  05/14/2004    Contiued medical therapy  . Cardiovascular stress test  09/19/2009    Observed defect is consistent with diaphragmatic attentuation. EKG negative for ischemia.  . Transthoracic echocardiogram  03/29/2008    EF 67%, mild mitral and tricuspid valve regurg.  . Cardioversion N/A 11/20/2014    Procedure: CARDIOVERSION;   Surgeon: Troy Sine, MD;  Location: [facility] ENDOSCOPY;  Service: Cardiovascular;  Laterality: N/A;    Current Outpatient Prescriptions  Medication Sig Dispense Refill  . alfuzosin (UROXATRAL) 10 MG 24 hr tablet Take 1 tablet by mouth daily. Take 1 tab daily  0  . amiodarone (PACERONE) 200 MG tablet Take 1 tablet daily for 1 week then increase to 1 tablet twice a day 60 tablet 6  . AXIRON 30 MG/ACT SOLN Apply 2 sprays topically daily.    . celecoxib (CELEBREX) 200 MG capsule Take 1 capsule by mouth daily.    Marland Kitchen etanercept (ENBREL) 50 MG/ML injection Inject 50 mg into the skin once a week.    . fentaNYL (DURAGESIC - DOSED MCG/HR) 25 MCG/HR patch Place 1 patch onto the skin. Every 2 days    . fexofenadine (ALLEGRA) 180 MG tablet Take 180 mg by mouth daily.    . Glucos-Chondroit-Hyaluron-MSM (GLUCOSAMINE CHONDROITIN JOINT PO) Take 2 tablets by mouth 2 (two) times daily.    Marland Kitchen LYRICA 75 MG capsule Take 1 capsule by mouth 2 (two) times daily.    . metoprolol (LOPRESSOR) 50 MG tablet Take 1.5 tablets (75 mg total) by mouth 2 (two) times daily. 180 tablet 6  . Multiple Vitamin (MULTIVITAMIN WITH MINERALS) TABS tablet Take 1 tablet by mouth daily.    Marland Kitchen  oxyCODONE (OXY IR/ROXICODONE) 5 MG immediate release tablet Take 1 tablet by mouth as needed.    . rivaroxaban (XARELTO) 20 MG TABS tablet Take 1 tablet (20 mg total) by mouth daily with supper. 30 tablet 5  . rosuvastatin (CRESTOR) 10 MG tablet Take 1 tablet (10 mg total) by mouth daily. 90 tablet 3  . sertraline (ZOLOFT) 50 MG tablet   0   No current facility-administered medications for this visit.    Allergies:   Penicillins    Social History:  The patient  reports that he quit smoking about 12 years ago. His smoking use included Cigarettes. He has never used smokeless tobacco. He reports that he drinks about 2.0 oz of alcohol per week.   Family History:  The patient's family history includes Diabetes in his mother.    ROS:  Please see the  history of present illness. All other systems are reviewed and negative.    PHYSICAL EXAM: VS:  BP 118/84 mmHg  Pulse 76  Ht 6' (1.829 m)  Wt 212 lb (96.163 kg)  BMI 28.75 kg/m2 , BMI Body mass index is 28.75 kg/(m^2). GEN: Well nourished, well developed, male in no acute distress HEENT: normal for age  Neck: no JVD, no carotid bruit, no masses Cardiac: Irreg R&R; no murmur, no rubs, or gallops Respiratory:  clear to auscultation bilaterally, normal work of breathing GI: soft, nontender, nondistended, + BS MS: no deformity or atrophy; no edema; distal pulses are 2+ in all 4 extremities  Skin: warm and dry, no rash Neuro:  Strength and sensation are intact Psych: euthymic mood, full affect   EKG:  EKG is ordered today. The ekg ordered today demonstrates Atrial fibrillation, controlled VR, HR 69   Recent Labs: 12/10/2015: ALT 19; BUN 24; Creat 0.85; Hemoglobin 13.5; Magnesium 1.7; Platelets 177; Potassium 4.4; Sodium 139; TSH 1.113    Lipid Panel    Component Value Date/Time   CHOL 145 12/10/2015 1330   TRIG 77 12/10/2015 1330   HDL 45 12/10/2015 1330   CHOLHDL 3.2 12/10/2015 1330   VLDL 15 12/10/2015 1330   LDLCALC 85 12/10/2015 1330     Wt Readings from Last 3 Encounters:  02/07/16 212 lb (96.163 kg)  12/14/15 211 lb 9.6 oz (95.981 kg)  11/12/15 209 lb 1 oz (94.83 kg)     Other studies Reviewed: Additional studies/ records that were reviewed today include:  Previous office notes and testing.  ASSESSMENT AND PLAN:  1.  Atrial fibrillation: He is symptomatic from the atrial fibrillation despite a normal heart rate. He is not having ischemic symptoms and has no signs or symptoms of volume overload. He has been compliant with his amiodarone. Direct current cardioversion will be arranged as an outpatient. If he wishes to wait until Dr. Claiborne Billings is available, Dr. Claiborne Billings will do it. We will check labs and a chest x-ray today.   At the time of his cardioversion, consider  decreasing his amiodarone to 200 mg daily.   2. Chronic anticoagulation: CHADSVASC=2 (HTN & CAD). He has been compliant with the Xarelto, not missed any doses.    Current medicines are reviewed at length with the patient today.  The patient does not have concerns regarding medicines.  The following changes have been made:  no change  Labs/ tests ordered today include:   Orders Placed This Encounter  Procedures  . ELECTRICAL CARDIOVERSION  . DG Chest 2 View  . APTT  . Basic metabolic panel  . Protime-INR  .  CBC  . EKG 12-Lead     Disposition:   FU with Dr. Claiborne Billings   Signed, Lenoard Aden  02/07/2016 10:38 AM    [facility], [place], [place]  [address] Phone: [phone]; Fax: [phone]

## 2016-02-19 NOTE — Anesthesia Preprocedure Evaluation (Addendum)
Anesthesia Evaluation  Patient identified by MRN, date of birth, ID band Patient awake    Reviewed: Allergy & Precautions, NPO status , Patient's Chart, lab work & pertinent test results, reviewed documented beta blocker date and time   History of Anesthesia Complications Negative for: history of anesthetic complications  Airway Mallampati: I  TM Distance: >3 FB Neck ROM: Full    Dental  (+) Teeth Intact, Dental Advisory Given   Pulmonary sleep apnea and Continuous Positive Airway Pressure Ventilation , former smoker,    Pulmonary exam normal breath sounds clear to auscultation       Cardiovascular hypertension, Pt. on medications and Pt. on home beta blockers + CAD  + dysrhythmias Atrial Fibrillation  Rhythm:Irregular Rate:Abnormal     Neuro/Psych PSYCHIATRIC DISORDERS Depression negative neurological ROS     GI/Hepatic negative GI ROS, Neg liver ROS,   Endo/Other  negative endocrine ROS  Renal/GU negative Renal ROS     Musculoskeletal  (+) Arthritis , Rheumatoid disorders,    Abdominal   Peds  Hematology  (+) Blood dyscrasia, ,   Anesthesia Other Findings Day of surgery medications reviewed with the patient.  Reproductive/Obstetrics                            Anesthesia Physical Anesthesia Plan  ASA: III  Anesthesia Plan: MAC   Post-op Pain Management:    Induction: Intravenous  Airway Management Planned: Nasal Cannula  Additional Equipment:   Intra-op Plan:   Post-operative Plan:   Informed Consent: I have reviewed the patients History and Physical, chart, labs and discussed the procedure including the risks, benefits and alternatives for the proposed anesthesia with the patient or authorized representative who has indicated his/her understanding and acceptance.   Dental advisory given  Plan Discussed with: CRNA and Anesthesiologist  Anesthesia Plan Comments:  (Discussed risks/benefits/alternatives to MAC sedation including need for ventilatory support, hypotension, need for conversion to general anesthesia.  All patient questions answered.  Patient wished to proceed.)        Anesthesia Quick Evaluation

## 2016-02-19 NOTE — Discharge Instructions (Signed)
°  Electrical Cardioversion, Care After Refer to this sheet in the next few weeks. These instructions provide you with information on caring for yourself after your procedure. Your health care provider may also give you more specific instructions. Your treatment has been planned according to current medical practices, but problems sometimes occur. Call your health care provider if you have any problems or questions after your procedure. WHAT TO EXPECT AFTER THE PROCEDURE After your procedure, it is typical to have the following sensations:  Some redness on the skin where the shocks were delivered. If this is tender, a sunburn lotion or hydrocortisone cream may help.  Possible return of an abnormal heart rhythm within hours or days after the procedure. HOME CARE INSTRUCTIONS  Take medicines only as directed by your health care provider. Be sure you understand how and when to take your medicine.  Learn how to feel your pulse and check it often.  Limit your activity for 48 hours after the procedure or as directed by your health care provider.  Avoid or minimize caffeine and other stimulants as directed by your health care provider. SEEK MEDICAL CARE IF:  You feel like your heart is beating too fast or your pulse is not regular.  You have any questions about your medicines.  You have bleeding that will not stop. SEEK IMMEDIATE MEDICAL CARE IF:  You are dizzy or feel faint.  It is hard to breathe or you feel short of breath.  There is a change in discomfort in your chest.  Your speech is slurred or you have trouble moving an arm or leg on one side of your body.  You get a serious muscle cramp that does not go away.  Your fingers or toes turn cold or blue.   This information is not intended to replace advice given to you by your health care provider. Make sure you discuss any questions you have with your health care provider.   Document Released: 09/28/2013 Document Revised:  12/29/2014 Document Reviewed: 09/28/2013 Elsevier Interactive Patient Education 2016 [place].   Per Dr. Claiborne Billings verbal instructions to patient monitor pulse rate at home. If resting heart rate is less than 54 beats per minute decrease Metoprolol to 50 mg by mouth twice daily.

## 2016-02-19 NOTE — Interval H&P Note (Signed)
History and Physical Interval Note:  02/19/2016 1:10 PM  Bryan Lemma  has presented today for surgery, with the diagnosis of AFIB  The various methods of treatment have been discussed with the patient and family. After consideration of risks, benefits and other options for treatment, the patient has consented to  Procedure(s): CARDIOVERSION (N/A) as a surgical intervention .  The patient's history has been reviewed, patient examined, no change in status, stable for surgery.  I have reviewed the patient's chart and labs.  Questions were answered to the patient's satisfaction.     KELLY,THOMAS A

## 2016-02-19 NOTE — Transfer of Care (Signed)
Immediate Anesthesia Transfer of Care Note  Patient: Nathan Mcclure  Procedure(s) Performed: Procedure(s): CARDIOVERSION (N/A)  Patient Location: PACU and Endoscopy Unit  Anesthesia Type:MAC  Level of Consciousness: awake, alert  and oriented  Airway & Oxygen Therapy: Patient Spontanous Breathing and Patient connected to nasal cannula oxygen  Post-op Assessment: Report given to RN and Post -op Vital signs reviewed and stable  Post vital signs: Reviewed and stable  Last Vitals:  Filed Vitals:   02/19/16 1326 02/19/16 1330  BP: 100/61 110/62  Pulse: 39 39  Resp: 15 13    Complications: No apparent anesthesia complications

## 2016-02-20 ENCOUNTER — Encounter (HOSPITAL_COMMUNITY): Payer: Self-pay | Admitting: Cardiovascular Disease

## 2016-02-22 NOTE — Anesthesia Postprocedure Evaluation (Signed)
Anesthesia Post Note  Patient: Nathan Mcclure  Procedure(s) Performed: Procedure(s) (LRB): CARDIOVERSION (N/A)  Patient location during evaluation: PACU Anesthesia Type: MAC Level of consciousness: awake and alert Pain management: pain level controlled Vital Signs Assessment: post-procedure vital signs reviewed and stable Respiratory status: spontaneous breathing, nonlabored ventilation, respiratory function stable and patient connected to nasal cannula oxygen Cardiovascular status: stable and blood pressure returned to baseline Anesthetic complications: no    Last Vitals:  Filed Vitals:   02/19/16 1350 02/19/16 1400  BP: 115/66 123/71  Pulse: 40 39  Resp: 10 13    Last Pain: There were no vitals filed for this visit.               Catalina Gravel

## 2016-03-10 ENCOUNTER — Ambulatory Visit (INDEPENDENT_AMBULATORY_CARE_PROVIDER_SITE_OTHER): Payer: BLUE CROSS/BLUE SHIELD | Admitting: Cardiovascular Disease

## 2016-03-10 ENCOUNTER — Ambulatory Visit: Payer: BLUE CROSS/BLUE SHIELD | Admitting: Cardiovascular Disease

## 2016-03-10 ENCOUNTER — Encounter: Payer: Self-pay | Admitting: Cardiovascular Disease

## 2016-03-10 VITALS — BP 142/80 | HR 41 | Ht 73.0 in | Wt 212.3 lb

## 2016-03-10 DIAGNOSIS — Z7901 Long term (current) use of anticoagulants: Secondary | ICD-10-CM

## 2016-03-10 DIAGNOSIS — I2583 Coronary atherosclerosis due to lipid rich plaque: Secondary | ICD-10-CM

## 2016-03-10 DIAGNOSIS — I481 Persistent atrial fibrillation: Secondary | ICD-10-CM

## 2016-03-10 DIAGNOSIS — I10 Essential (primary) hypertension: Secondary | ICD-10-CM

## 2016-03-10 DIAGNOSIS — I2510 Atherosclerotic heart disease of native coronary artery without angina pectoris: Secondary | ICD-10-CM

## 2016-03-10 DIAGNOSIS — E785 Hyperlipidemia, unspecified: Secondary | ICD-10-CM | POA: Diagnosis not present

## 2016-03-10 DIAGNOSIS — I4819 Other persistent atrial fibrillation: Secondary | ICD-10-CM

## 2016-03-10 DIAGNOSIS — G4733 Obstructive sleep apnea (adult) (pediatric): Secondary | ICD-10-CM

## 2016-03-10 MED ORDER — AMIODARONE HCL 200 MG PO TABS: 200.0000 mg | ORAL_TABLET | Freq: Every day | ORAL | Status: DC

## 2016-03-10 MED ORDER — METOPROLOL TARTRATE 25 MG PO TABS: 25.0000 mg | ORAL_TABLET | Freq: Two times a day (BID) | ORAL | Status: DC

## 2016-03-10 NOTE — Progress Notes (Signed)
Patient ID: Nathan Mcclure, male   DOB: 01-28-1951, 65 y.o.   MRN: [id number]    Primary M.D.: Dr. Deland Pretty  HPI: Nathan Mcclure is a 65 y.o. male who presents to the office for  followup cardiology evaluation after his DC cardioversion which was done on 02/19/2016.  Nathan Mcclure has a history of mild CAD initially found in 2000. His last cardiac catheterization in May 2005 revealed  mild nonobstructive CAD involving the LAD, intermediate, and right coronary artery treated medically. He has a history of hyperlipidemia. He's been diagnosed with rheumatoid arthritis. He had done well with beta blocker therapy for many years but last year stopped taking this when his prescription had run out and he was seen as an add-on on 01/24/2014.  He was found to be in atrial fibrillation with ventricular rate at 110 beats per minute. He was restarted on low-dose metoprolol tartrate at 25 mg twice a day. Laboratory showed a TSH that was normal at 1.12. He had normal CMP profile. Creatinine was 0.85. He was started on Xarelto 20 mg with  normal hemoglobin and hematocrit. I further increased his metoprolol to 50 mg twice a day. An echo Doppler study  on 02/23/2014  showed an ejection fraction of 50-55%. There was mild to moderate mitral regurgitation. His left atrium measured 42 mm.  He was initially started on Propofenone150 mg every 8 hours and when attempted to be titrated to 225 mg 3 times a day he was unable to tolerate the increased dose of Propofenone due to nausea and reduced his dose back to 150 mg.   Due to concerns for significant obstructive sleep apnea with excessive daytime sleepiness he  underwent a sleep study which confirmed moderate sleep apnea with an AHI of 15.1.  He dropped his oxygen 87%.  There was heavy snoring.  He had a significant delay in ultimately undergoing a CPAP titration due to his canceling of the study, but ultimately a CPAP trial was done on 06/29/2014.  He was found to develop  central apneic events in ultimately was switched to BiPAP.  He was titrated up to an IPAP of 16 and EPAP of 12, but continues to have centrals during sleep-wake junctions.  He has an AirCurve 10 Auto ResMed BiPAP unit and his current settings are for minimum EPAP of 10 with a maximum IPAP of 20.  A download was obtained from October 16 through 11/04/2014.  He met compliance with days of use being 80% but days greater than 4 hours was only 27%.  He was averaging 3 hours and 43 minutes of use.  Upon further questioning, he states as the pressure would build up  he would develop significant leakage  around the seal in the mask and mask would essentially blow off his face.  For this reason, he has not been able to be compliant.  He brings his unit with him and mask to the office today for further evaluation and assessment. A review of his download indicates a significant increase leak with 95th sent.  I'll leak at 97.6.  His AHI was 13.4.  When I  saw him one year ago, we scheduled him for a cardioversion, which was successfully done  on 11/20/2014.  At that time, he converted with just 1 shock at 120 J to normal sinus rhythm.  Apparently, he never had a follow-up evaluation.  Over the past year, he has not been very compliant with his medications.  He was only intermittently  using BiPAP 2 times per week.  Instead of  taking his metoprolol tartrate 75 mg twice a day he was only taking this 50 mg daily.  He has continued Xarelto 20 mg for anticoagulation.  He began to notice that he was feeling more chest fluttering.  He denied chest pain.  He denies PND or orthopnea.  He has not been using BiPAP.  He has been using [facility] for his [employer].  When I saw him in November 2016 he was in atrial fibrillation of questionable duration. His resting pulse was 101 and I increased his metoprolol to 75 mg twice a day. He has felt improved. He also has resumed using BiPAP therapy and admits that he feels so  much better now that he has been using this on a daily basis.  When seen in December.  He was started on amiodarone 200 mg a day for 1 week and was then titrated to 2 mg twice a day.  He continued to be in atrial fibrillation.  On 02/19/2016 he underwent successful DC cardioversion and required 3 shocks at 120, 150, and ultimately cardioverted at 200 J.  His postprocedure ECG showed sinus bradycardia.  Subsequently, he has been on a reduced dose of metoprolol at 50 mg twice a day but has continued to take the amiodarone 20 mg twice a day.  He omits to 100% compliance with CPAP use.  He continues to be on Xarelto for anticoagulation.  He admits to resting pulse is in the 40s to at times.  38.  He denies presyncope or syncope.  He denies chest pressure.  He presents for follow-up evaluation  Past Surgical History  Procedure Laterality Date  . Cardiac catheterization  05/14/2004    Contiued medical therapy  . Cardiovascular stress test  09/19/2009    Observed defect is consistent with diaphragmatic attentuation. EKG negative for ischemia.  . Transthoracic echocardiogram  03/29/2008    EF 67%, mild mitral and tricuspid valve regurg.  . Cardioversion N/A 11/20/2014    Procedure: CARDIOVERSION;  Surgeon: Troy Sine, MD;  Location: [facility];  Service: Cardiovascular;  Laterality: N/A;  . Cardioversion N/A 02/19/2016    Procedure: CARDIOVERSION;  Surgeon: Troy Sine, MD;  Location: [facility] ENDOSCOPY;  Service: Cardiovascular;  Laterality: N/A;    Allergies  Allergen Reactions  . Penicillins     Current Outpatient Prescriptions  Medication Sig Dispense Refill  . AXIRON 30 MG/ACT SOLN Apply 2 sprays topically daily.    . cefUROXime (CEFTIN) 500 MG tablet 500 mg once.    . celecoxib (CELEBREX) 200 MG capsule Take 1 capsule by mouth daily.    Marland Kitchen etanercept (ENBREL) 50 MG/ML injection Inject 50 mg into the skin once a week.    . fentaNYL (DURAGESIC - DOSED MCG/HR) 25 MCG/HR patch Place 1 patch onto  the skin. Every 2 days    . fexofenadine (ALLEGRA) 180 MG tablet Take 180 mg by mouth daily.    . Glucos-Chondroit-Hyaluron-MSM (GLUCOSAMINE CHONDROITIN JOINT PO) Take 2 tablets by mouth 2 (two) times daily.    . Multiple Vitamin (MULTIVITAMIN WITH MINERALS) TABS tablet Take 1 tablet by mouth daily.    Marland Kitchen oxyCODONE (OXY IR/ROXICODONE) 5 MG immediate release tablet Take 1 tablet by mouth as needed.    . rivaroxaban (XARELTO) 20 MG TABS tablet Take 1 tablet (20 mg total) by mouth daily with supper. 30 tablet 5  . rosuvastatin (CRESTOR) 10 MG tablet Take 1 tablet (10 mg total)  by mouth daily. 90 tablet 3  . sertraline (ZOLOFT) 100 MG tablet 100 mg once.    Marland Kitchen amiodarone (PACERONE) 200 MG tablet Take 1 tablet (200 mg total) by mouth daily. 90 tablet 3  . metoprolol tartrate (LOPRESSOR) 25 MG tablet Take 1 tablet (25 mg total) by mouth 2 (two) times daily. 180 tablet 3   No current facility-administered medications for this visit.    Social History   Social History  . Marital Status: Married    Spouse Name: N/A  . Number of Children: N/A  . Years of Education: N/A   Occupational History  . Not on file.   Social History Main Topics  . Smoking status: Former Smoker    Types: Cigarettes    Quit date: 01/25/2004  . Smokeless tobacco: Never Used  . Alcohol Use: 2.0 oz/week    4 Standard drinks or equivalent per week  . Drug Use: Not on file  . Sexual Activity: Not on file   Other Topics Concern  . Not on file   Social History Narrative   Socially he is widowed for 3 years.  There is no tobacco use.  He had recently broke up with his fiance.  Family History  Problem Relation Age of Onset  . Diabetes Mother     ROS General: Negative; No fevers, chills, or night sweats;  HEENT: Negative; No changes in vision or hearing, sinus congestion, difficulty swallowing Pulmonary: Negative; No cough, wheezing, shortness of breath, hemoptysis Cardiovascular:  unaware of his atrial  fibrillation; No chest pain, presyncope, syncope,  GI: Negative; No nausea, vomiting, diarrhea, or abdominal pain GU: Negative; No dysuria, hematuria, or difficulty voiding Musculoskeletal: Positive for cervical and lumbar disc disease; Hematologic/Oncology: Negative; no easy bruising, bleeding Endocrine: Negative; no heat/cold intolerance; no diabetes Neuro: Negative; no changes in balance, headaches Skin: Negative; No rashes or skin lesions Psychiatric: Negative; No behavioral problems, depression Sleep: Positive for moderate complex obstructive sleep apnea, residual daytime sleepiness, hypersomnolence; negative for bruxism, restless legs, hypnogognic hallucinations, no cataplexy Other comprehensive 14 point system review is negative.  PE BP 142/80 mmHg  Pulse 41  Ht 6' 1"  (1.854 m)  Wt 212 lb 4.8 oz (96.299 kg)  BMI 28.02 kg/m2   Wt Readings from Last 3 Encounters:  03/10/16 212 lb 4.8 oz (96.299 kg)  02/19/16 205 lb (92.987 kg)  02/07/16 212 lb (96.163 kg)   General: Alert, oriented, no distress.  Skin: normal turgor, no rashes HEENT: Normocephalic, atraumatic. Pupils round and reactive; sclera anicteric;no lid lag. Extraocular muscles intact;; no xanthelasmas. Nose without nasal septal hypertrophy Mouth/Parynx benign; Mallinpatti scale 3 Neck: No JVD, no carotid bruits; normal carotid upstroke Lungs: clear to ausculatation and percussion; no wheezing or rales Chest wall: no tenderness to palpitation Heart: Regular bradycardic rhythm in the low 40s;, s1 s2 normal; 1/6 systolic murmur;no diastolic murmur, rub thrills or heaves Abdomen: soft, nontender; no hepatosplenomehaly, BS+; abdominal aorta nontender and not dilated by palpation. Back: no CVA tenderness Pulses 2+ Extremities: no clubbing cyanosis or edema, Homan's sign negative  Neurologic: grossly nonfocal; cranial nerves grossly normal. Psychologic: normal affect and mood.  ECG (independently read by me): Marked  sinus bradycardia 41 bpm.  Nondiagnostic T changes.  QTc interval 528 ms.  12/14/2015 ECG (independently read by me):  Atrial fibrillation at 90 bpm.  Nonspecific T changes.  QTc interval 442 ms.  November 12 2015 ECG (independently read by me): Atrial fibrillation with a rapid ventricular response at 10 1 bpm.  Nonspecific ST-T changes.  11/09/2014 ECG (independently read by me): Atrial fibrillation with controlled ventricular response in the 70s.  QTc interval 407 ms.  June 2015 ECG (independently read by me): Atrial fibrillation with a ventricular rate at 100 beats per minute.  QTc interval 428 ms.  Nonspecific T changes  05/08/2014 ECG (independently read by me): atrial fibrillation at 94 beats per minute.  QTc interval 425 ms.  02/23/2014 ECG (independently read by me) atrial fibrillation at 98 beats per minute. Nonspecific ST-T changes   Prior 02/08/2014 ECG (independently read by me): Atrial fibrillation with a rapid ventricular response at 128 beats per minute with nonspecific ST-T changes.   LABS: [facility] from [facility] has been reviewed.  BMP Latest Ref Rng 02/13/2016 12/10/2015 11/15/2014  Glucose 65 - 99 mg/dL 73 91 142(H)  BUN 7 - 25 mg/dL 18 24 21   Creatinine 0.70 - 1.25 mg/dL 0.99 0.85 1.00  Sodium 135 - 146 mmol/L 144 139 143  Potassium 3.5 - 5.3 mmol/L 4.2 4.4 4.1  Chloride 98 - 110 mmol/L 106 105 109  CO2 20 - 31 mmol/L 27 28 25   Calcium 8.6 - 10.3 mg/dL 8.7 8.8 9.2   Hepatic Function Latest Ref Rng 12/10/2015 11/15/2014 01/24/2014  Total Protein 6.1 - 8.1 g/dL 6.7 6.9 6.7  Albumin 3.6 - 5.1 g/dL 4.1 4.2 4.1  AST 10 - 35 U/L 20 22 23   ALT 9 - 46 U/L 19 21 21   Alk Phosphatase 40 - 115 U/L 53 60 66  Total Bilirubin 0.2 - 1.2 mg/dL 0.7 0.8 0.4   CBC Latest Ref Rng 02/13/2016 12/10/2015 11/15/2014  WBC 4.0 - 10.5 K/uL 6.4 5.4 6.0  Hemoglobin 13.0 - 17.0 g/dL 14.8 13.5 16.1  Hematocrit 39.0 - 52.0 % 44.6 39.4 47.5  Platelets 150 - 400 K/uL 202 177 212    Lab Results  Component Value Date   MCV 94.7 02/13/2016   MCV 94.0 12/10/2015   MCV 91.3 11/15/2014   Lab Results  Component Value Date   TSH 1.113 12/10/2015  No results found for: HGBA1C  Lipid Panel     Component Value Date/Time   CHOL 145 12/10/2015 1330   TRIG 77 12/10/2015 1330   HDL 45 12/10/2015 1330   CHOLHDL 3.2 12/10/2015 1330   VLDL 15 12/10/2015 1330   LDLCALC 85 12/10/2015 1330    RADIOLOGY: No results found.    ASSESSMENT AND PLAN: Nathan Mcclure is a 65 year old gentleman who has mild nonobstructive coronary disease. He developed atrial fibrillation after stopping his metoprolol in 2015.  An echo Doppler study confirmed an ejection fraction of 50-55% with mild-to-moderate mitral regurgitation and mild biatrial enlargement. Remotely he had been on propofenone and metoprolol.   He has been on BiPAP auto unit for complex sleep apnea .  When I saw him in November 2016.  He was back in atrial fibrillation of questionable duration.  He had not been using his BiPAP therapy.  He resumed his BiPAP therapy and his dose of beta blocker was increased and subsequently amiodarone was added to his medical regimen.  He has been on Xarelto anticoagulation and is tolerating this well without bleeding.  He underwent successful cardioversion or 28 2017.  His ECG today continues to show sinus rhythm but he has marked bradycardia with a heart rate of 41 bpm.  I'm recommending that he decrease his amiodarone from 400 mg to 200 mg daily.  He will also reduce his metoprolol tartrate from 50 mg  twice a day to 25 mg twice a day, but he will not take a dose this evening.  I again discussed the importance of continued treatment for his obstructive sleep apnea on the doubling of recurrent risk if left untreated.  He will continue to monitor his heart rate.  He is not having any presyncope or syncope.  I will see him in 4-6 weeks for reevaluation or sooner if problems arise.  Time spent: 25  minutes Troy Sine, MD, [facility]  03/10/2016 5:37 PM

## 2016-03-10 NOTE — Patient Instructions (Signed)
HOLD METOPROLOL TODAY  STARTING TOMORROW - DECREASE TO  25 MG  (1/2 TABLET OF 50 MG)  TWICE A DAY  DECREASE AMIODARONE 200 MG ONE TABLET DAILY.  Your physician wants you to follow-up in 4-6 [place].  You will receive a reminder letter in the mail two months in advance. If you don't receive a letter, please call our office to schedule the follow-up appointment.  If you need a refill on your cardiac medications before your next appointment, please call your pharmacy.

## 2016-04-21 DIAGNOSIS — G894 Chronic pain syndrome: Secondary | ICD-10-CM | POA: Diagnosis not present

## 2016-04-21 DIAGNOSIS — M5106 Intervertebral disc disorders with myelopathy, lumbar region: Secondary | ICD-10-CM | POA: Diagnosis not present

## 2016-04-21 DIAGNOSIS — Z6827 Body mass index (BMI) 27.0-27.9, adult: Secondary | ICD-10-CM | POA: Diagnosis not present

## 2016-04-21 DIAGNOSIS — I10 Essential (primary) hypertension: Secondary | ICD-10-CM | POA: Diagnosis not present

## 2016-05-08 ENCOUNTER — Encounter: Payer: Self-pay | Admitting: Cardiovascular Disease

## 2016-05-08 ENCOUNTER — Ambulatory Visit (INDEPENDENT_AMBULATORY_CARE_PROVIDER_SITE_OTHER): Payer: BLUE CROSS/BLUE SHIELD | Admitting: Cardiovascular Disease

## 2016-05-08 VITALS — BP 186/98 | HR 37 | Ht 72.0 in | Wt 207.4 lb

## 2016-05-08 DIAGNOSIS — I10 Essential (primary) hypertension: Secondary | ICD-10-CM | POA: Diagnosis not present

## 2016-05-08 DIAGNOSIS — G4733 Obstructive sleep apnea (adult) (pediatric): Secondary | ICD-10-CM

## 2016-05-08 DIAGNOSIS — Z7901 Long term (current) use of anticoagulants: Secondary | ICD-10-CM | POA: Diagnosis not present

## 2016-05-08 DIAGNOSIS — I2510 Atherosclerotic heart disease of native coronary artery without angina pectoris: Secondary | ICD-10-CM

## 2016-05-08 DIAGNOSIS — I480 Paroxysmal atrial fibrillation: Secondary | ICD-10-CM

## 2016-05-08 DIAGNOSIS — E785 Hyperlipidemia, unspecified: Secondary | ICD-10-CM

## 2016-05-08 MED ORDER — LOSARTAN POTASSIUM 50 MG PO TABS: 50.0000 mg | ORAL_TABLET | Freq: Every day | ORAL | Status: DC

## 2016-05-08 MED ORDER — METOPROLOL TARTRATE 25 MG PO TABS: 12.5000 mg | ORAL_TABLET | Freq: Two times a day (BID) | ORAL | Status: DC

## 2016-05-08 MED ORDER — ROSUVASTATIN CALCIUM 20 MG PO TABS: 20.0000 mg | ORAL_TABLET | Freq: Every day | ORAL | Status: DC

## 2016-05-08 NOTE — Patient Instructions (Signed)
Your physician has recommended you make the following change in your medication:   1.) the lopressor has been decreased to 1/2 tablet twice a day.  2.) the crestor has been increased to 20 mg daily.  3.) start new prescription for Losartan 50 mg. This has been sent to your pharmacy.   Your physician recommends that you schedule a follow-up appointment in: 3 months with Dr Claiborne Billings.

## 2016-05-10 ENCOUNTER — Encounter: Payer: Self-pay | Admitting: Cardiovascular Disease

## 2016-05-10 NOTE — Progress Notes (Signed)
Patient ID: Nathan Mcclure, male   DOB: [birth year], 65 y.o.   MRN: [id number]    Primary M.D.: Dr. Deland Pretty  HPI: Nathan Mcclure is a 65 y.o. male who presents to the office fora 2 month followup cardiology evaluation.  [name] has a history of mild CAD initially found in 2000. His last cardiac catheterization in May 2005 revealed  mild nonobstructive CAD involving the LAD, intermediate, and RCA treated medically. He has a history of hyperlipidemia. He's been diagnosed with rheumatoid arthritis. He had done well with beta blocker therapy for many years but last year stopped taking this when his prescription had run out and he was seen as an add-on on 01/24/2014.  He was found to be in atrial fibrillation with ventricular rate at 110 beats per minute. He was restarted on low-dose metoprolol tartrate at 25 mg twice a day. Laboratory showed a TSH that was normal at 1.12. He had normal CMP profile. Creatinine was 0.85. He was started on Xarelto 20 mg with  normal hemoglobin and hematocrit. I further increased his metoprolol to 50 mg twice a day. An echo Doppler study  on 02/23/2014  showed an ejection fraction of 50-55%. There was mild to moderate mitral regurgitation. His left atrium measured 42 mm.  He was initially started on Propofenone150 mg every 8 hours and when attempted to be titrated to 225 mg 3 times a day he was unable to tolerate the increased dose of Propofenone due to nausea and reduced his dose back to 150 mg.   Due to concerns for significant obstructive sleep apnea with excessive daytime sleepiness he  underwent a sleep study which confirmed moderate sleep apnea with an AHI of 15.1.  He dropped his oxygen 87%.  There was heavy snoring.  He had a significant delay in ultimately undergoing a CPAP titration due to his canceling of the study, but ultimately a CPAP trial was done on 06/29/2014.  He was found to develop central apneic events in ultimately was switched to BiPAP.  He was  titrated up to an IPAP of 16 and EPAP of 12, but continues to have centrals during sleep-wake junctions.  He has an AirCurve 10 Auto ResMed BiPAP unit and his current settings are for minimum EPAP of 10 with a maximum IPAP of 20.  A download was obtained from October 16 through 11/04/2014.  He met compliance with days of use being 80% but days greater than 4 hours was only 27%.  He was averaging 3 hours and 43 minutes of use.  Upon further questioning, he states as the pressure would build up  he would develop significant leakage  around the seal in the mask and mask would essentially blow off his face.  For this reason, he has not been able to be compliant.  He brings his unit with him and mask to the office today for further evaluation and assessment. A review of his download indicates a significant increase leak with 95th sent.  I'll leak at 97.6.  His AHI was 13.4.  When I  saw him one year ago, we scheduled him for a cardioversion, which was successfully done  on 11/20/2014.  At that time, he converted with just 1 shock at 120 J to normal sinus rhythm.   Over the past year, he has not been very compliant with his medications.  He was only intermittently using BiPAP 2 times per week.  Instead of  taking his metoprolol tartrate 75 mg twice  a day he was only taking this 50 mg daily.  He has continued Xarelto 20 mg for anticoagulation.  He began to notice that he was feeling more chest fluttering.  He denied chest pain.  He denies PND or orthopnea.  He had not been using BiPAP.  He has been using [facility] for his DME company.  When I saw him in November 2016 he was in atrial fibrillation of questionable duration. His resting pulse was 101 and I increased his metoprolol to 75 mg twice a day. He has felt improved. He also has resumed using BiPAP therapy and admits that he feels so much better now that he has been using this on a daily basis.  When seen in December.  He was started on amiodarone 200  mg a day for 1 week and was then titrated to 2 mg twice a day.  He continued to be in atrial fibrillation.  On 02/19/2016 he underwent successful DC cardioversion and required 3 shocks at 120, 150, and ultimately cardioverted at 200 J.  His postprocedure ECG showed sinus bradycardia.  Subsequently, he has been on a reduced dose of metoprolol at 50 mg twice a day but has continued to take the amiodarone 20 mg twice a day.  He omits to 100% compliance with CPAP use.  He continues to be on Xarelto for anticoagulation.  He admits to resting pulse is in the 40s to at times. When I saw for post cardioversion follow-up .  I reduced his metoprolol from 50 mg twice a day to 25 mg twice a day.  I again discussed importance of continued treatment for his obstructive sleep apnea.  Presently, he admits to 100% compliance with sleep apnea.  He has been under increased stress since his fiance walked out on him and began a relationship 2 weeks later with another male.  He does note fatigue.  He continues to work at [employer] in the [place] and works approximate 35-40 hours per week.  He presents for reevaluation.  Past Surgical History  Procedure Laterality Date  . Cardiac catheterization  05/14/2004    Contiued medical therapy  . Cardiovascular stress test  09/19/2009    Observed defect is consistent with diaphragmatic attentuation. EKG negative for ischemia.  . Transthoracic echocardiogram  03/29/2008    EF 67%, mild mitral and tricuspid valve regurg.  . Cardioversion N/A 11/20/2014    Procedure: CARDIOVERSION;  Surgeon: Troy Sine, MD;  Location: [facility];  Service: Cardiovascular;  Laterality: N/A;  . Cardioversion N/A 02/19/2016    Procedure: CARDIOVERSION;  Surgeon: Troy Sine, MD;  Location: [facility] ENDOSCOPY;  Service: Cardiovascular;  Laterality: N/A;    Allergies  Allergen Reactions  . Penicillins     Current Outpatient Prescriptions  Medication Sig Dispense Refill  .  amiodarone (PACERONE) 200 MG tablet Take 1 tablet (200 mg total) by mouth daily. 90 tablet 3  . AXIRON 30 MG/ACT SOLN Apply 2 sprays topically daily.    . celecoxib (CELEBREX) 200 MG capsule Take 1 capsule by mouth daily.    . fexofenadine (ALLEGRA) 180 MG tablet Take 180 mg by mouth daily.    . Glucos-Chondroit-Hyaluron-MSM (GLUCOSAMINE CHONDROITIN JOINT PO) Take 2 tablets by mouth 2 (two) times daily.    . metoprolol tartrate (LOPRESSOR) 25 MG tablet Take 0.5 tablets (12.5 mg total) by mouth 2 (two) times daily. 30 tablet 3  . Multiple Vitamin (MULTIVITAMIN WITH MINERALS) TABS tablet Take 1 tablet by mouth daily.    Marland Kitchen  oxyCODONE (OXY IR/ROXICODONE) 5 MG immediate release tablet Take 1 tablet by mouth as needed.    . rivaroxaban (XARELTO) 20 MG TABS tablet Take 1 tablet (20 mg total) by mouth daily with supper. 30 tablet 5  . sertraline (ZOLOFT) 100 MG tablet 100 mg once.    Marland Kitchen losartan (COZAAR) 50 MG tablet Take 1 tablet (50 mg total) by mouth daily. 30 tablet 6  . rosuvastatin (CRESTOR) 20 MG tablet Take 1 tablet (20 mg total) by mouth daily. 90 tablet 3   No current facility-administered medications for this visit.    Social History   Social History  . Marital Status: Married    Spouse Name: N/A  . Number of Children: N/A  . Years of Education: N/A   Occupational History  . Not on file.   Social History Main Topics  . Smoking status: Former Smoker    Types: Cigarettes    Quit date: 01/25/2004  . Smokeless tobacco: Never Used  . Alcohol Use: 2.0 oz/week    4 Standard drinks or equivalent per week  . Drug Use: Not on file  . Sexual Activity: Not on file   Other Topics Concern  . Not on file   Social History Narrative   Socially he is widowed for 3 years.  There is no tobacco use.  He had recently broke up with his fiance.  Family History  Problem Relation Age of Onset  . Diabetes Mother     ROS General: Negative; No fevers, chills, or night sweats;  HEENT:  Negative; No changes in vision or hearing, sinus congestion, difficulty swallowing Pulmonary: Negative; No cough, wheezing, shortness of breath, hemoptysis Cardiovascular:  unaware of his atrial fibrillation; No chest pain, presyncope, syncope,  GI: Negative; No nausea, vomiting, diarrhea, or abdominal pain GU: Negative; No dysuria, hematuria, or difficulty voiding Musculoskeletal: Positive for cervical and lumbar disc disease; Hematologic/Oncology: Negative; no easy bruising, bleeding Endocrine: Negative; no heat/cold intolerance; no diabetes Neuro: Negative; no changes in balance, headaches Skin: Negative; No rashes or skin lesions Psychiatric: Negative; No behavioral problems, depression Sleep: Positive for moderate complex obstructive sleep apnea, no residual daytime sleepiness, hypersomnolence; negative for bruxism, restless legs, hypnogognic hallucinations, no cataplexy Other comprehensive 14 point system review is negative.  PE BP 186/98 mmHg  Pulse 37  Ht 6' (1.829 m)  Wt 207 lb 6.4 oz (94.076 kg)  BMI 28.12 kg/m2   Repeat blood pressure by me 160/90.  He tells me his blood pressure at home has been running in the 465-681 systolic range.  Wt Readings from Last 3 Encounters:  05/08/16 207 lb 6.4 oz (94.076 kg)  03/10/16 212 lb 4.8 oz (96.299 kg)  02/19/16 205 lb (92.987 kg)   General: Alert, oriented, no distress.  Skin: normal turgor, no rashes HEENT: Normocephalic, atraumatic. Pupils round and reactive; sclera anicteric;no lid lag. Extraocular muscles intact;; no xanthelasmas. Nose without nasal septal hypertrophy Mouth/Parynx benign; Mallinpatti scale 3 Neck: No JVD, no carotid bruits; normal carotid upstroke Lungs: clear to ausculatation and percussion; no wheezing or rales Chest wall: no tenderness to palpitation Heart: Regular bradycardic rhythm in the upper 30s to low 40s;, s1 s2 normal; 2-75 systolic murmur at apex; no diastolic murmur, rub thrills or  heaves Abdomen: soft, nontender; no hepatosplenomehaly, BS+; abdominal aorta nontender and not dilated by palpation. Back: no CVA tenderness Pulses 2+ Extremities: no clubbing cyanosis or edema, Homan's sign negative  Neurologic: grossly nonfocal; cranial nerves grossly normal. Psychologic: normal affect and mood.  ECG (independently read by me): Marked sinus bradycardia with a ventricular rate of 37 bpm with sinus arrhythmia  March 2017 ECG (independently read by me): Marked sinus bradycardia 41 bpm.  Nondiagnostic T changes.  QTc interval 528 ms.  12/14/2015 ECG (independently read by me):  Atrial fibrillation at 90 bpm.  Nonspecific T changes.  QTc interval 442 ms.  November 12 2015 ECG (independently read by me): Atrial fibrillation with a rapid ventricular response at 10 1 bpm.  Nonspecific ST-T changes.  11/09/2014 ECG (independently read by me): Atrial fibrillation with controlled ventricular response in the 70s.  QTc interval 407 ms.  June 2015 ECG (independently read by me): Atrial fibrillation with a ventricular rate at 100 beats per minute.  QTc interval 428 ms.  Nonspecific T changes  05/08/2014 ECG (independently read by me): atrial fibrillation at 94 beats per minute.  QTc interval 425 ms.  02/23/2014 ECG (independently read by me) atrial fibrillation at 98 beats per minute. Nonspecific ST-T changes   Prior 02/08/2014 ECG (independently read by me): Atrial fibrillation with a rapid ventricular response at 128 beats per minute with nonspecific ST-T changes.   LABS: Laboratory from [facility] has been reviewed.  BMP Latest Ref Rng 02/13/2016 12/10/2015 11/15/2014  Glucose 65 - 99 mg/dL 73 91 142(H)  BUN 7 - 25 mg/dL _0 Creatinine 0.70 - 1.25 mg/dL 0.99 0.85 1.00  Sodium 135 - 146 mmol/L 144 139 143  Potassium 3.5 - 5.3 mmol/L 4.2 4.4 4.1  Chloride 98 - 110 mmol/L 106 105 109  CO2 20 - 31 mmol/L _1 Calcium 8.6 - 10.3 mg/dL 8.7 8.8 9.2   Hepatic  Function Latest Ref Rng 12/10/2015 11/15/2014 01/24/2014  Total Protein 6.1 - 8.1 g/dL 6.7 6.9 6.7  Albumin 3.6 - 5.1 g/dL 4.1 4.2 4.1  AST 10 - 35 U/L _2 ALT 9 - 46 U/L _3 Alk Phosphatase 40 - 115 U/L 53 60 66  Total Bilirubin 0.2 - 1.2 mg/dL 0.7 0.8 0.4   CBC Latest Ref Rng 02/13/2016 12/10/2015 11/15/2014  WBC 4.0 - 10.5 K/uL 6.4 5.4 6.0  Hemoglobin 13.0 - 17.0 g/dL 14.8 13.5 16.1  Hematocrit 39.0 - 52.0 % 44.6 39.4 47.5  Platelets 150 - 400 K/uL 202 177 212   Lab Results  Component Value Date   MCV 94.7 02/13/2016   MCV 94.0 12/10/2015   MCV 91.3 11/15/2014   Lab Results  Component Value Date   TSH 1.113 12/10/2015  No results found for: HGBA1C   Lipid Panel     Component Value Date/Time   CHOL 145 12/10/2015 1330   TRIG 77 12/10/2015 1330   HDL 45 12/10/2015 1330   CHOLHDL 3.2 12/10/2015 1330   VLDL 15 12/10/2015 1330   LDLCALC 85 12/10/2015 1330    RADIOLOGY: No results found.    ASSESSMENT AND PLAN: Nathan Mcclure is a 65 year old gentleman who has mild nonobstructive coronary disease. He developed atrial fibrillation after stopping his metoprolol in 2015.  An echo Doppler study confirmed an ejection fraction of 50-55% with mild-to-moderate mitral regurgitation and mild biatrial enlargement. Remotely he had been on propofenone and metoprolol.   He has been on BiPAP auto unit for complex sleep apnea .  Since his cardioversion he continues to use BiPAP therapy with 100% compliance.  He continues to be bradycardic despite his reduced beta blocker dose from his last office visit.  As result, I will  further decrease his metoprolol, tartrate to 12.5 mg twice a day.  He has stage II hypertension today and I'm adding losartan 50 mg daily. I've also recommended increasing Crestor to 20 mg for his hyperlipidemia with target LDL less than 70.  He continues to be on Xarelto 20 mg daily for anticoagulation.  He denies bleeding.  I will see him in 3 months for  reevaluation.  Time spent: 25 minutes Troy Sine, MD, [facility]  05/10/2016 11:00 PM

## 2016-05-25 ENCOUNTER — Other Ambulatory Visit: Payer: Self-pay | Admitting: Cardiovascular Disease

## 2016-06-02 DIAGNOSIS — R109 Unspecified abdominal pain: Secondary | ICD-10-CM | POA: Diagnosis not present

## 2016-06-03 ENCOUNTER — Telehealth: Payer: Self-pay | Admitting: Cardiovascular Disease

## 2016-06-03 DIAGNOSIS — R310 Gross hematuria: Secondary | ICD-10-CM | POA: Diagnosis not present

## 2016-06-03 DIAGNOSIS — N201 Calculus of ureter: Secondary | ICD-10-CM | POA: Diagnosis not present

## 2016-06-03 DIAGNOSIS — N202 Calculus of kidney with calculus of ureter: Secondary | ICD-10-CM | POA: Diagnosis not present

## 2016-06-03 NOTE — Telephone Encounter (Signed)
Request for surgical clearance:  1. What type of surgery is being performed? Lipho   2. When is this surgery scheduled? 06-09-16   3. Are there any medications that need to be held prior to surgery and how long?Can he hold Xarelto 72 hrs prior to procedure?   4. Name of physician performing surgery? Dr Raynelle Bring   5. What is your office phone and fax number? [phone] and fax number is [phone]  6.

## 2016-06-03 NOTE — Telephone Encounter (Signed)
Ok to hold xarelto x 3 days for procedure per office protocol.

## 2016-06-04 ENCOUNTER — Other Ambulatory Visit: Payer: Self-pay | Admitting: Urology

## 2016-06-10 ENCOUNTER — Encounter (HOSPITAL_COMMUNITY): Payer: Self-pay | Admitting: General Practice

## 2016-06-12 ENCOUNTER — Encounter (HOSPITAL_COMMUNITY)
Admission: RE | Disposition: A | Discharge status: Home / Self Care | Payer: Self-pay | Source: Ambulatory Visit | Attending: Urology

## 2016-06-12 ENCOUNTER — Ambulatory Visit (HOSPITAL_COMMUNITY): Payer: BLUE CROSS/BLUE SHIELD

## 2016-06-12 ENCOUNTER — Other Ambulatory Visit: Payer: Self-pay

## 2016-06-12 ENCOUNTER — Ambulatory Visit (HOSPITAL_COMMUNITY)
Admission: RE | Admit: 2016-06-12 | Discharge: 2016-06-12 | Disposition: A | Discharge status: Home / Self Care | Payer: BLUE CROSS/BLUE SHIELD | Source: Ambulatory Visit | Attending: Urology | Admitting: Urology

## 2016-06-12 DIAGNOSIS — Z7901 Long term (current) use of anticoagulants: Secondary | ICD-10-CM | POA: Diagnosis not present

## 2016-06-12 DIAGNOSIS — Z79899 Other long term (current) drug therapy: Secondary | ICD-10-CM | POA: Diagnosis not present

## 2016-06-12 DIAGNOSIS — F419 Anxiety disorder, unspecified: Secondary | ICD-10-CM | POA: Insufficient documentation

## 2016-06-12 DIAGNOSIS — N201 Calculus of ureter: Secondary | ICD-10-CM | POA: Insufficient documentation

## 2016-06-12 DIAGNOSIS — I10 Essential (primary) hypertension: Secondary | ICD-10-CM | POA: Insufficient documentation

## 2016-06-12 DIAGNOSIS — N5201 Erectile dysfunction due to arterial insufficiency: Secondary | ICD-10-CM | POA: Insufficient documentation

## 2016-06-12 DIAGNOSIS — Z87891 Personal history of nicotine dependence: Secondary | ICD-10-CM | POA: Insufficient documentation

## 2016-06-12 HISTORY — DX: Anxiety disorder, unspecified: F41.9

## 2016-06-12 HISTORY — DX: Chronic kidney disease, unspecified: N18.9

## 2016-06-12 HISTORY — DX: Unspecified osteoarthritis, unspecified site: M19.90

## 2016-06-12 SURGERY — LITHOTRIPSY, ESWL
Anesthesia: LOCAL | Laterality: Left

## 2016-06-12 MED ORDER — DIAZEPAM 5 MG PO TABS
10.0000 mg | ORAL_TABLET | ORAL | Status: AC
  Administered 2016-06-12: 10 mg via ORAL
  Filled 2016-06-12: qty 2

## 2016-06-12 MED ORDER — OXYCODONE HCL 5 MG PO TABS
10.0000 mg | ORAL_TABLET | ORAL | Status: AC
  Administered 2016-06-12: 10 mg via ORAL
  Filled 2016-06-12: qty 2

## 2016-06-12 MED ORDER — CIPROFLOXACIN HCL 500 MG PO TABS
500.0000 mg | ORAL_TABLET | ORAL | Status: AC
  Administered 2016-06-12: 500 mg via ORAL
  Filled 2016-06-12: qty 1

## 2016-06-12 MED ORDER — DIPHENHYDRAMINE HCL 25 MG PO CAPS
25.0000 mg | ORAL_CAPSULE | ORAL | Status: AC
  Administered 2016-06-12: 25 mg via ORAL
  Filled 2016-06-12: qty 1

## 2016-06-12 MED ORDER — METOPROLOL TARTRATE 25 MG PO TABS: 12.5000 mg | ORAL_TABLET | Freq: Two times a day (BID) | ORAL | Status: DC

## 2016-06-12 MED ORDER — SODIUM CHLORIDE 0.9 % IV SOLN
INTRAVENOUS | Status: DC
  Administered 2016-06-12: 09:00:00 via INTRAVENOUS

## 2016-06-12 NOTE — Interval H&P Note (Signed)
History and Physical Interval Note: Nathan Mcclure has migrated down to his distal ureter - measures ~78mm.  Xarelto stopped >72 hours prior.  Okay to proceed.  06/12/2016 10:55 AM  Nathan Mcclure  has presented today for surgery, with the diagnosis of LEFT PROXIMAL URETERAL STONE   The various methods of treatment have been discussed with the patient and family. After consideration of risks, benefits and other options for treatment, the patient has consented to  Procedure(s): LEFT EXTRACORPOREAL SHOCK WAVE LITHOTRIPSY (ESWL) (Left) as a surgical intervention .  The patient's history has been reviewed, patient examined, no change in status, stable for surgery.  I have reviewed the patient's chart and labs.  Questions were answered to the patient's satisfaction.     Louis Meckel W

## 2016-06-12 NOTE — Op Note (Signed)
See [facility] OP note scanned into chart. Also because of the size, density, location and other factors that cannot be anticipated I feel this will likely be a staged procedure. This fact supersedes any indication in the scanned [facility] operative note to the contrary.  

## 2016-06-12 NOTE — H&P (Signed)
HPI: Nathan Mcclure is a 65 year-old male established patient who is here for renal calculi.  Patient had an xray at [facility] and showed a 1.1 cm kidney stone on the left side. Patient is having more pain today on the right side.   He has had gross hematuria along with bilateral flank pain. Denies passing any stone material. Has chronic back pain which he takes Oxycodone 5 mg daily which is not controlling pain. Denies passing any stone material.   The problem is on both sides. He first started noticing pain on approximately 05/13/2016. This is not his first kidney stone. His first stone was approximately 05/22/1986. He has had 3 stones prior to getting this one. He is currently having flank pain, back pain, and groin pain. He denies having nausea, vomiting, fever, and chills. He has not caught a stone in his urine strainer since his symptoms began.   He did see the blood in his urine. He first noticed the symptoms 05/13/2016. He has not seen blood clots.   He does not have a burning sensation when he urinates. He is not currently having trouble urinating.   He is having pain. He has not recently had unwanted weight loss.   His last U/S or CT Scan was 06/03/2016.     ALLERGIES: Penicillins    MEDICATIONS: Androgel 20.25 mg/1.25 gram (1.62 %) gel in packet  Cialis 5 mg tablet  Crestor 20 mg tablet  Metoprolol Tartrate 25 mg tablet  Allegra Allergy  Losartan Potassium 50 mg tablet  Multi-Day Vitamins  Oxycodone Hcl 5 mg tablet  Sertraline Hcl 100 mg tablet  Xarelto 20 mg tablet     GU PSH: No GU PSH      PSH Notes: Neck Surgery, Back Surgery   NON-GU PSH: No Non-GU PSH    GU PMH: BPH w/LUTS, Benign prostatic hyperplasia with urinary obstruction - 11/09/2014 ED, arterial insufficiency, Erectile dysfunction due to arterial insufficiency - 11/09/2014 Testicular hypofunction, Hypogonadism, testicular - 11/09/2014 Bladder Outlet Obstruction, Bladder neck contracture -  2014 Calculus Ureter, Ureteral Stone - 2014 Elevated PSA, Elevated prostate specific antigen (PSA) - 2014 Low back pain, Lumbago - 2014 Personal Hx Oth Urinary System diseases, History of hematuria - 2014 Retrograde ejaculation, Retrograde ejaculation - 2014    NON-GU PMH: Encounter for general adult medical examination without abnormal findings, Encounter for preventive health examination - 11/09/2014 Personal history of other diseases of the circulatory system, History of atrial fibrillation - 11/09/2014, History of hypertension, - 2014 , Groin (Inguinal) Pain Right Side - 2014 Anxiety disorder, unspecified, Anxiety - 2014 Decreased libido, Decreased libido - 2014 Personal history of other diseases of the digestive system, History of esophageal reflux - 2014 Personal history of other endocrine, nutritional and metabolic disease, History of hypercholesterolemia - 2014 Personal history of other specified conditions, History of heartburn - 2014 , Arthritis - 2008    FAMILY HISTORY: Family Health Status Number Of Children - Runs In Family Prostate Cancer - Father   SOCIAL HISTORY: Marital Status: Married Patient does not smoke anymore.  Does not use smokeless tobacco. Has never drank.  Does not use drugs. Drinks 3 caffeinated drinks per day. Has not had a blood transfusion.     Notes: Former smoker, Occupation:, Caffeine Use, Marital History - Currently Married, Alcohol Use, Tobacco Use   REVIEW OF SYSTEMS:    GU Review Male:   Patient reports frequent urination. Patient denies hard to postpone urination, burning/ pain with urination, get  up at night to urinate, leakage of urine, stream starts and stops, trouble starting your stream, have to strain to urinate , erection problems, and penile pain.  Gastrointestinal (Upper):   Patient denies nausea, vomiting, and indigestion/ heartburn.  Gastrointestinal (Lower):   Patient denies diarrhea and constipation.  Constitutional:   Patient  denies fever, night sweats, weight loss, and fatigue.  Skin:   Patient denies skin rash/ lesion and itching.  Eyes:   Patient denies blurred vision and double vision.  Ears/ Nose/ Throat:   Patient denies sore throat and sinus problems.  Hematologic/Lymphatic:   Patient denies swollen glands and easy bruising.  Cardiovascular:   Patient denies leg swelling and chest pains.  Respiratory:   Patient denies cough and shortness of breath.  Endocrine:   Patient denies excessive thirst.  Musculoskeletal:   Patient reports back pain. Patient denies joint pain.  Neurological:   Patient reports headaches. Patient denies dizziness.  Psychologic:   Patient reports anxiety. Patient denies depression.   VITAL SIGNS:    Weight: 200 lb/90.7 kg   Height/Length: 72 in / 183 cm   BP: 135/80 mmHg   Pulse: 47 /min   Temp: 98.0 F / 37 C   BMI: 27.1      GU PHYSICAL EXAMINATION:    Anus and Perineum: No hemorrhoids. No anal stenosis. No rectal fissure, no anal fissure. No edema, no dimple, no perineal tenderness, no anal tenderness.  Scrotum: No lesions. No edema. No cysts. No warts.  Epididymides: Right: no spermatocele, no masses, no cysts, no tenderness, no induration, no enlargement. Left: no spermatocele, no masses, no cysts, no tenderness, no induration, no enlargement.  Testes: No tenderness, no swelling, no enlargement left testes. No tenderness, no swelling, no enlargement right testes. Normal location left testes. Normal location right testes. No mass, no cyst, no varicocele, no hydrocele left testes. No mass, no cyst, no varicocele, no hydrocele right testes.  Urethral Meatus: Normal size. No lesion, no wart, no discharge, no polyp. Normal location.  Penis: Circumcised, no warts, no cracks. No dorsal Peyronie's plaques, no left corporal Peyronie's plaques, no right corporal Peyronie's plaques, no scarring, no warts. No balanitis, no meatal stenosis.  Prostate: 40 gram or 2+ size. Left lobe normal  consistency, right lobe normal consistency. Symmetrical lobes. No prostate nodule. Left lobe no tenderness, right lobe no tenderness.  Seminal Vesicles: Nonpalpable.  Sphincter Tone: Normal sphincter. No rectal tenderness. No rectal mass.    MULTI-SYSTEM PHYSICAL EXAMINATION:    Constitutional: Well-nourished. No physical deformities. Normally developed. Good grooming.   Neck: Neck symmetrical, not swollen. Normal tracheal position.  Respiratory: No labored breathing, no use of accessory muscles.   Cardiovascular: Normal temperature, normal extremity pulses, no swelling, no varicosities.  Lymphatic: No enlargement of neck, axillae, groin.  Skin: No paleness, no jaundice, no cyanosis. No lesion, no ulcer, no rash.  Neurologic / Psychiatric: Oriented to time, oriented to place, oriented to person. No depression, no anxiety, no agitation.  Gastrointestinal: No mass, no tenderness, no rigidity, non obese abdomen. No CVA pain with palpation  Eyes: Normal conjunctivae. Normal eyelids.  Ears, Nose, Mouth, and Throat: Left ear no scars, no lesions, no masses. Right ear no scars, no lesions, no masses. Nose no scars, no lesions, no masses. Normal hearing. Normal lips.  Musculoskeletal: Head and neck pain with movement.    PAST DATA REVIEWED:  Source Of History:  Patient  Records Review:   POC Tool  Urine Test Review:   Urinalysis  X-Ray Review: KUB: Reviewed Films. Discussed With Patient. Large left 1.1 cm upper pole renal calculus. No right renal or bilateral ureteral calculus noted C.T. Urogram: Reviewed Films. Discussed With Patient. CT urogram shows left proximal ureteral stone w/o obstruction. No right renal or ureteral calculus    PROCEDURES:         C.T. Urogram - [id number]        Urinalysis w/Scope - 81001 Dipstick Dipstick Cont'd Micro  Specimen: Voided Bilirubin: Neg WBC/hpf: NS (Not Seen)  Color: Amber Ketones: Trace RBC/hpf: >60/hpf  Appearance: Clear Blood: 3+ Bacteria: Few  (10-25/hpf)  Specific Gravity: 1.020 Protein: Trace Cystals: Amorph Phosphate  pH: 7.5 Urobilinogen: 1.0 Casts: NS (Not Seen)  Glucose: Neg Nitrites: Neg Trichomonas: Not Present    Leukocyte Esterase: Neg Mucous: Not Present      Epithelial Cells: 0-5/hpf      Yeast: NS (Not Seen)      Sperm: Not Present    ASSESSMENT:      ICD-10 Details  1 GU:   Calculus Ureter - N20.1 Left, Stable, Acute  2   Gross hematuria - R31.0    PLAN:            Medications New Meds: Uroxatral 10 mg tablet, extended release 24 hr 1 tablet PO Daily   #30  3 Refill(s)            Orders X-Rays: C.T. Urogram Without Contrast  X-Ray Notes:  History:  Hematuria: Yes/No  Patient to see MD after exam: Yes/No  Previous exam: CT / IVP/ US/ KUB/ None  When:  Where:  Diabetic: Yes/ No  BUN/ Creatinine:  Date of last BUN Creatinine:  Weight in pounds:  Allergy- IV Contrast: Yes/ No  Conflicting diabetic meds: Yes/ No  Diabetic Meds:  Prior Authorization #: [id number]           Schedule Return Visit: Next Available Appointment  Return Notes: Schedule (L) ESWL with ESWL MD    A/P:   Will renew Alfuzosin ER 10 mg 1 po daily.  Pt will strain urine. If stone captured will contact office and bring stone to office for analysis  Recommend that pt proceed with elective ESWL (L) proximal ureteral stone. Risks and benefits discussed with pt of risk of hematuria, renal hematoma, and possible need for second procedure ie repeat ESWL vs cystourethroscopy, (L) RPG, stone extraction. All questions addressed and answered to pt's satisfaction and wishes to proceed at this time. Pt will need cardiac clearance to stop Xarelto by Dr. Claiborne Billings  Will begin Alfuzosin ER 10 mg 1 po daily  Pt has Oxycodone on hand for chronic back pain which he will continue to use PRN and as Rx'd

## 2016-06-12 NOTE — Discharge Instructions (Signed)
Resume Xarelto in 48 hours  See [facility] discharge instructions in chart. Moderate Conscious Sedation, Adult, Care After Refer to this sheet in the next few weeks. These instructions provide you with information on caring for yourself after your procedure. Your health care provider may also give you more specific instructions. Your treatment has been planned according to current medical practices, but problems sometimes occur. Call your health care provider if you have any problems or questions after your procedure. WHAT TO EXPECT AFTER THE PROCEDURE  After your procedure:  You may feel sleepy, clumsy, and have poor balance for several hours.  Vomiting may occur if you eat too soon after the procedure. HOME CARE INSTRUCTIONS  Do not participate in any activities where you could become injured for at least 24 hours. Do not:  Drive.  Swim.  Ride a bicycle.  Operate heavy machinery.  Cook.  Use power tools.  Climb ladders.  Work from a high place.  Do not make important decisions or sign legal documents until you are improved.  If you vomit, drink water, juice, or soup when you can drink without vomiting. Make sure you have little or no nausea before eating solid foods.  Only take over-the-counter or prescription medicines for pain, discomfort, or fever as directed by your health care provider.  Make sure you and your family fully understand everything about the medicines given to you, including what side effects may occur.  You should not drink alcohol, take sleeping pills, or take medicines that cause drowsiness for at least 24 hours.  If you smoke, do not smoke without supervision.  If you are feeling better, you may resume normal activities 24 hours after you were sedated.  Keep all appointments with your health care provider. SEEK MEDICAL CARE IF:  Your skin is pale or bluish in color.  You continue to feel nauseous or vomit.  Your pain is getting  worse and is not helped by medicine.  You have bleeding or swelling.  You are still sleepy or feeling clumsy after 24 hours. SEEK IMMEDIATE MEDICAL CARE IF:  You develop a rash.  You have difficulty breathing.  You develop any type of allergic problem.  You have a fever. MAKE SURE YOU:  Understand these instructions.  Will watch your condition.  Will get help right away if you are not doing well or get worse.   This information is not intended to replace advice given to you by your health care provider. Make sure you discuss any questions you have with your health care provider.   Document Released: 09/28/2013 Document Revised: 12/29/2014 Document Reviewed: 09/28/2013 Elsevier Interactive Patient Education [employer].

## 2016-06-13 ENCOUNTER — Other Ambulatory Visit: Payer: Self-pay

## 2016-06-13 MED ORDER — METOPROLOL TARTRATE 25 MG PO TABS: 12.5000 mg | ORAL_TABLET | Freq: Two times a day (BID) | ORAL | Status: DC

## 2016-06-14 ENCOUNTER — Other Ambulatory Visit: Payer: Self-pay | Admitting: Cardiovascular Disease

## 2016-06-16 NOTE — Telephone Encounter (Signed)
Rx request sent to pharmacy.  

## 2016-07-02 DIAGNOSIS — M5106 Intervertebral disc disorders with myelopathy, lumbar region: Secondary | ICD-10-CM | POA: Diagnosis not present

## 2016-07-02 DIAGNOSIS — G894 Chronic pain syndrome: Secondary | ICD-10-CM | POA: Diagnosis not present

## 2016-07-08 ENCOUNTER — Other Ambulatory Visit: Payer: Self-pay

## 2016-07-08 ENCOUNTER — Telehealth: Payer: Self-pay | Admitting: Cardiovascular Disease

## 2016-07-08 MED ORDER — LOSARTAN POTASSIUM 100 MG PO TABS: 100.0000 mg | ORAL_TABLET | Freq: Every day | ORAL | Status: DC

## 2016-07-08 MED ORDER — AMIODARONE HCL 200 MG PO TABS: 200.0000 mg | ORAL_TABLET | Freq: Two times a day (BID) | ORAL | Status: DC

## 2016-07-08 NOTE — Telephone Encounter (Signed)
Returned call to patient.He stated his B/P continues to be elevated.Ranging 150/70,160/70,145/70 pulse 60's.Spoke to DOD Dr.Crenshaw he advised to increase Losartan to 100 mg daily.Advised continue all other medications.Advised to monitor B/P and call back if continues to be elevated.

## 2016-07-08 NOTE — Telephone Encounter (Signed)
New message      Pt c/o medication issue:  1. Name of Medication: losartan 2. How are you currently taking this medication (dosage and times per day)? 50mg  daily 3. Are you having a reaction (difficulty breathing--STAT)? no  4. What is your medication issue? Pt states the losartan is making him have headaches in the am.  His bp is 140-150/70-80.  Please call

## 2016-07-08 NOTE — Telephone Encounter (Signed)
Returned call to patient no answer.LMTC. 

## 2016-07-09 ENCOUNTER — Other Ambulatory Visit: Payer: Self-pay

## 2016-07-30 DIAGNOSIS — I2510 Atherosclerotic heart disease of native coronary artery without angina pectoris: Secondary | ICD-10-CM | POA: Diagnosis not present

## 2016-07-30 DIAGNOSIS — Z0000 Encounter for general adult medical examination without abnormal findings: Secondary | ICD-10-CM | POA: Diagnosis not present

## 2016-07-30 DIAGNOSIS — I10 Essential (primary) hypertension: Secondary | ICD-10-CM | POA: Diagnosis not present

## 2016-07-30 DIAGNOSIS — F329 Major depressive disorder, single episode, unspecified: Secondary | ICD-10-CM | POA: Diagnosis not present

## 2016-07-30 DIAGNOSIS — Z0001 Encounter for general adult medical examination with abnormal findings: Secondary | ICD-10-CM | POA: Diagnosis not present

## 2016-07-30 DIAGNOSIS — E7800 Pure hypercholesterolemia, unspecified: Secondary | ICD-10-CM | POA: Diagnosis not present

## 2016-07-31 DIAGNOSIS — M79674 Pain in right toe(s): Secondary | ICD-10-CM | POA: Diagnosis not present

## 2016-07-31 DIAGNOSIS — M059 Rheumatoid arthritis with rheumatoid factor, unspecified: Secondary | ICD-10-CM | POA: Diagnosis not present

## 2016-08-05 DIAGNOSIS — N201 Calculus of ureter: Secondary | ICD-10-CM | POA: Diagnosis not present

## 2016-08-27 ENCOUNTER — Ambulatory Visit (INDEPENDENT_AMBULATORY_CARE_PROVIDER_SITE_OTHER): Payer: BLUE CROSS/BLUE SHIELD | Admitting: Cardiovascular Disease

## 2016-08-27 ENCOUNTER — Encounter: Payer: Self-pay | Admitting: Cardiovascular Disease

## 2016-08-27 VITALS — BP 146/80 | HR 37 | Ht 72.0 in | Wt 216.2 lb

## 2016-08-27 DIAGNOSIS — I2510 Atherosclerotic heart disease of native coronary artery without angina pectoris: Secondary | ICD-10-CM | POA: Diagnosis not present

## 2016-08-27 DIAGNOSIS — Z7901 Long term (current) use of anticoagulants: Secondary | ICD-10-CM | POA: Diagnosis not present

## 2016-08-27 DIAGNOSIS — I10 Essential (primary) hypertension: Secondary | ICD-10-CM | POA: Diagnosis not present

## 2016-08-27 DIAGNOSIS — I480 Paroxysmal atrial fibrillation: Secondary | ICD-10-CM

## 2016-08-27 MED ORDER — AMIODARONE HCL 200 MG PO TABS: 200.0000 mg | ORAL_TABLET | Freq: Every day | ORAL | 2 refills | Status: DC

## 2016-08-27 NOTE — Progress Notes (Signed)
Patient ID: Nathan Mcclure, male   DOB: 01-24-1951, 65 y.o.   MRN: [id number]    Primary M.D.: Dr. Deland Pretty  HPI: Nathan Mcclure is a 65 y.o. male who presents to the office fora 4 month followup cardiology evaluation.  [name] has a history of mild CAD initially found in 2000. His last cardiac catheterization in May 2005 revealed  mild nonobstructive CAD involving the LAD, intermediate, and RCA treated medically. He has a history of hyperlipidemia. He's been diagnosed with rheumatoid arthritis. He had done well with beta blocker therapy for many years but last year stopped taking this when his prescription had run out and he was seen as an add-on on 01/24/2014.  He was found to be in atrial fibrillation with ventricular rate at 110 beats per minute. He was restarted on low-dose metoprolol tartrate at 25 mg twice a day. Laboratory showed a TSH that was normal at 1.12. He had normal CMP profile. Creatinine was 0.85. He was started on Xarelto 20 mg with  normal hemoglobin and hematocrit. I further increased his metoprolol to 50 mg twice a day. An echo Doppler study  on 02/23/2014  showed an ejection fraction of 50-55%. There was mild to moderate mitral regurgitation. His left atrium measured 42 mm.  He was initially started on Propofenone150 mg every 8 hours and when attempted to be titrated to 225 mg 3 times a day he was unable to tolerate the increased dose of Propofenone due to nausea and reduced his dose back to 150 mg.   Due to concerns for significant obstructive sleep apnea with excessive daytime sleepiness he  underwent a sleep study which confirmed moderate sleep apnea with an AHI of 15.1.  He dropped his oxygen 87%.  There was heavy snoring.  He had a significant delay in ultimately undergoing a CPAP titration due to his canceling of the study, but ultimately a CPAP trial was done on 06/29/2014.  He was found to develop central apneic events in ultimately was switched to BiPAP.  He was  titrated up to an IPAP of 16 and EPAP of 12, but continues to have centrals during sleep-wake junctions.  He has an AirCurve 10 Auto ResMed BiPAP unit and his current settings are for minimum EPAP of 10 with a maximum IPAP of 20.  A download was obtained from October 16 through 11/04/2014.  He met compliance with days of use being 80% but days greater than 4 hours was only 27%.  He was averaging 3 hours and 43 minutes of use.  Upon further questioning, he states as the pressure would build up  he would develop significant leakage  around the seal in the mask and mask would essentially blow off his face.  For this reason, he has not been able to be compliant.  He brings his unit with him and mask to the office today for further evaluation and assessment. A review of his download indicates a significant increase leak at 97.6.  His AHI was 13.4.  He underwent an initial cardioversion  on 11/20/2014.  At that time, he converted with just 1 shock at 120 J to normal sinus rhythm.   Over the past year, he has not been very compliant with his medications.  He was only intermittently using BiPAP 2 times per week.  Instead of  taking his metoprolol tartrate 75 mg twice a day he was only taking this 50 mg daily.  He has continued Xarelto 20 mg for anticoagulation.  He began to notice that he was feeling more chest fluttering.  He denied chest pain.  He denies PND or orthopnea.  He had not been using BiPAP.  He has been using [facility] for his DME company.  When I saw him in November 2016 he was in atrial fibrillation of questionable duration. His resting pulse was 101 and I increased his metoprolol to 75 mg twice a day. He has felt improved. He also has resumed using BiPAP therapy and admits that he feels so much better now that he has been using this on a daily basis.  When seen in December.  He was started on amiodarone 200 mg a day for 1 week and was then titrated to 2 mg twice a day.  He continued to be  in atrial fibrillation.  On 02/19/2016 he underwent successful DC cardioversion and required 3 shocks at 120, 150, and ultimately cardioverted at 200 J.  His postprocedure ECG showed sinus bradycardia.  Subsequently, he has been on a reduced dose of metoprolol at 50 mg twice a day but has continued to take the amiodarone 20 mg twice a day.  He omits to 100% compliance with CPAP use.  He continues to be on Xarelto for anticoagulation.  He admits to resting pulse is in the 40s to at times. When I saw for post cardioversion follow-up .  I reduced his metoprolol from 50 mg twice a day to 25 mg twice a day.  I again discussed importance of continued treatment for his obstructive sleep apnea.  Presently, he admits to 100% compliance with sleep apnea.  He has been under increased stress since his fiance walked out on him and began a relationship 2 weeks later with another male.  He does note fatigue.  He continues to work at [employer] in the [place] and works approximate 35-40 hours per week.    Since I last saw him, he developed a kidney stone and underwent successful lithotripsy.  He tells Xarelto for the procedure.  He is still working.  He admits to 100% compliance with BiPAP therapy.  He was recently started on Humira for his rheumatoid arthritis and is followed by Dr. Trudie Reed at [facility] rheumatology.  He admits to fatigue.  He is unaware of any breakthrough atrial fibrillation.  Of note, when he has taken naps during the day, is not use his BiPAP machine.Marland Kitchen  He often awakens with an apnea spell.  He presents for follow-up evaluation.  Past Surgical History:  Procedure Laterality Date  . BACK SURGERY  2005  . CARDIAC CATHETERIZATION  05/14/2004   Contiued medical therapy  . CARDIOVASCULAR STRESS TEST  09/19/2009   Observed defect is consistent with diaphragmatic attentuation. EKG negative for ischemia.  Marland Kitchen CARDIOVERSION N/A 11/20/2014   Procedure: CARDIOVERSION;  Surgeon: Troy Sine, MD;  Location[facility] ENDOSCOPY;  Service: Cardiovascular;  Laterality: N/A;  . CARDIOVERSION N/A 02/19/2016   Procedure: CARDIOVERSION;  Surgeon: Troy Sine, MD;  Location: [place];  Service: Cardiovascular;  Laterality: N/A;  . CERVICAL FUSION  2005  . [place], 2007   stent insertion, stone basket   . TONSILLECTOMY     as a child  . TRANSTHORACIC ECHOCARDIOGRAM  03/29/2008   EF 67%, mild mitral and tricuspid valve regurg.    Allergies  Allergen Reactions  . Penicillins     Current Outpatient Prescriptions  Medication Sig Dispense Refill  . amiodarone (PACERONE) 200 MG tablet Take  1 tablet (200 mg total) by mouth daily. 30 tablet 2  . AXIRON 30 MG/ACT SOLN Apply 2 sprays topically daily.    . celecoxib (CELEBREX) 200 MG capsule Take 1 capsule by mouth daily.    . fexofenadine (ALLEGRA) 180 MG tablet Take 180 mg by mouth daily.    . Glucos-Chondroit-Hyaluron-MSM (GLUCOSAMINE CHONDROITIN JOINT PO) Take 2 tablets by mouth 2 (two) times daily.    Marland Kitchen HUMIRA PEN 40 MG/0.8ML PNKT Inject 1 Dose as directed every 14 (fourteen) days.    Marland Kitchen losartan (COZAAR) 100 MG tablet Take 1 tablet (100 mg total) by mouth daily. 30 tablet 6  . Multiple Vitamin (MULTIVITAMIN WITH MINERALS) TABS tablet Take 1 tablet by mouth daily.    . ondansetron (ZOFRAN-ODT) 4 MG disintegrating tablet Take 4 mg by mouth every 8 (eight) hours as needed for nausea or vomiting.    Marland Kitchen oxyCODONE (OXY IR/ROXICODONE) 5 MG immediate release tablet Take 1 tablet by mouth as needed.    . rosuvastatin (CRESTOR) 20 MG tablet Take 1 tablet (20 mg total) by mouth daily. 90 tablet 3  . sertraline (ZOLOFT) 100 MG tablet 100 mg once.    . tamsulosin (FLOMAX) 0.4 MG CAPS capsule Take 0.4 mg by mouth.    Alveda Reasons 20 MG TABS tablet TAKE 1 TABLET (20 MG TOTAL) BY MOUTH DAILY WITH SUPPER. 30 tablet 5   No current facility-administered medications for this visit.     Social History   Social History  . Marital  status: Married    Spouse name: N/A  . Number of children: N/A  . Years of education: N/A   Occupational History  . Not on file.   Social History Main Topics  . Smoking status: Former Smoker    Types: Cigarettes    Quit date: 01/25/2004  . Smokeless tobacco: Never Used  . Alcohol use 2.0 oz/week    4 Standard drinks or equivalent per week  . Drug use: Unknown  . Sexual activity: Not on file   Other Topics Concern  . Not on file   Social History Narrative  . No narrative on file   Socially he is widowed for 3 years.  There is no tobacco use.  He had recently broke up with his fiance.  Family History  Problem Relation Age of Onset  . Diabetes Mother     ROS General: Negative; No fevers, chills, or night sweats;  HEENT: Negative; No changes in vision or hearing, sinus congestion, difficulty swallowing Pulmonary: Negative; No cough, wheezing, shortness of breath, hemoptysis Cardiovascular:  unaware of his atrial fibrillation; No chest pain, presyncope, syncope,  GI: Negative; No nausea, vomiting, diarrhea, or abdominal pain GU: Negative; No dysuria, hematuria, or difficulty voiding Musculoskeletal: Positive for cervical and lumbar disc disease; Hematologic/Oncology: Negative; no easy bruising, bleeding Endocrine: Negative; no heat/cold intolerance; no diabetes Neuro: Negative; no changes in balance, headaches Skin: Negative; No rashes or skin lesions Psychiatric: Negative; No behavioral problems, depression Sleep: Positive for moderate complex obstructive sleep apnea, no residual daytime sleepiness, hypersomnolence; negative for bruxism, restless legs, hypnogognic hallucinations, no cataplexy Other comprehensive 14 point system review is negative.  PE BP (!) 146/80   Pulse (!) 37   Ht 6' (1.829 m)   Wt 216 lb 3.2 oz (98.1 kg)   BMI 29.32 kg/m    Repeat blood pressure by me 140/80.   Wt Readings from Last 3 Encounters:  08/27/16 216 lb 3.2 oz (98.1 kg)  06/12/16  208 lb (  94.3 kg)  05/08/16 207 lb 6.4 oz (94.1 kg)   General: Alert, oriented, no distress.  Skin: normal turgor, no rashes HEENT: Normocephalic, atraumatic. Pupils round and reactive; sclera anicteric;no lid lag. Extraocular muscles intact;; no xanthelasmas. Nose without nasal septal hypertrophy Mouth/Parynx benign; Mallinpatti scale 3 Neck: No JVD, no carotid bruits; normal carotid upstroke Lungs: clear to ausculatation and percussion; no wheezing or rales Chest wall: no tenderness to palpitation Heart: Regular bradycardic rhythm in the upper 30s to low 40s;, s1 s2 normal; 2-75 systolic murmur at apex; no diastolic murmur, rub thrills or heaves Abdomen: soft, nontender; no hepatosplenomehaly, BS+; abdominal aorta nontender and not dilated by palpation. Back: no CVA tenderness Pulses 2+ Extremities: no clubbing cyanosis or edema, Homan's sign negative  Neurologic: grossly nonfocal; cranial nerves grossly normal. Psychologic: normal affect and mood.  ECG (independently read by me): Marked sinus bradycardia with a heart rate at 37 bpm.  PR interval 180 ms.  QTc interval 414 ms.  May 2017 ECG (independently read by me): Marked sinus bradycardia with a ventricular rate of 37 bpm with sinus arrhythmia  March 2017 ECG (independently read by me): Marked sinus bradycardia 41 bpm.  Nondiagnostic T changes.  QTc interval 528 ms.  12/14/2015 ECG (independently read by me):  Atrial fibrillation at 90 bpm.  Nonspecific T changes.  QTc interval 442 ms.  November 12 2015 ECG (independently read by me): Atrial fibrillation with a rapid ventricular response at 10 1 bpm.  Nonspecific ST-T changes.  11/09/2014 ECG (independently read by me): Atrial fibrillation with controlled ventricular response in the 70s.  QTc interval 407 ms.  June 2015 ECG (independently read by me): Atrial fibrillation with a ventricular rate at 100 beats per minute.  QTc interval 428 ms.  Nonspecific T changes  05/08/2014 ECG  (independently read by me): atrial fibrillation at 94 beats per minute.  QTc interval 425 ms.  02/23/2014 ECG (independently read by me) atrial fibrillation at 98 beats per minute. Nonspecific ST-T changes   Prior 02/08/2014 ECG (independently read by me): Atrial fibrillation with a rapid ventricular response at 128 beats per minute with nonspecific ST-T changes.   LABS: Laboratory from [facility] has been reviewed.  BMP Latest Ref Rng & Units 02/13/2016 12/10/2015 11/15/2014  Glucose 65 - 99 mg/dL 73 91 142(H)  BUN 7 - 25 mg/dL _0 Creatinine 0.70 - 1.25 mg/dL 0.99 0.85 1.00  Sodium 135 - 146 mmol/L 144 139 143  Potassium 3.5 - 5.3 mmol/L 4.2 4.4 4.1  Chloride 98 - 110 mmol/L 106 105 109  CO2 20 - 31 mmol/L _1 Calcium 8.6 - 10.3 mg/dL 8.7 8.8 9.2   Hepatic Function Latest Ref Rng & Units 12/10/2015 11/15/2014 01/24/2014  Total Protein 6.1 - 8.1 g/dL 6.7 6.9 6.7  Albumin 3.6 - 5.1 g/dL 4.1 4.2 4.1  AST 10 - 35 U/L _2 ALT 9 - 46 U/L _3 Alk Phosphatase 40 - 115 U/L 53 60 66  Total Bilirubin 0.2 - 1.2 mg/dL 0.7 0.8 0.4   CBC Latest Ref Rng & Units 02/13/2016 12/10/2015 11/15/2014  WBC 4.0 - 10.5 K/uL 6.4 5.4 6.0  Hemoglobin 13.0 - 17.0 g/dL 14.8 13.5 16.1  Hematocrit 39.0 - 52.0 % 44.6 39.4 47.5  Platelets 150 - 400 K/uL 202 177 212   Lab Results  Component Value Date   MCV 94.7 02/13/2016   MCV 94.0 12/10/2015   MCV 91.3 11/15/2014  Lab Results  Component Value Date   TSH 1.113 12/10/2015  No results found for: HGBA1C   Lipid Panel     Component Value Date/Time   CHOL 145 12/10/2015 1330   TRIG 77 12/10/2015 1330   HDL 45 12/10/2015 1330   CHOLHDL 3.2 12/10/2015 1330   VLDL 15 12/10/2015 1330   LDLCALC 85 12/10/2015 1330    RADIOLOGY: No results found.  I reviewed his recent urologic evaluation for renal calculi.  He tolerated lithotripsy without cardiovascular compromise.  ASSESSMENT AND PLAN: Mr. Hershal Eriksson is a  65 year old gentleman who has mild nonobstructive coronary disease. He developed atrial fibrillation after stopping his metoprolol in 2015.  An echo Doppler study confirmed an ejection fraction of 50-55% with mild-to-moderate mitral regurgitation and mild biatrial enlargement. Remotely he had been on propofenone and metoprolol.   He has been on BiPAP auto unit for complex sleep apnea .  Since his cardioversion he continues to use BiPAP therapy with 100% compliance. His blood pressure today is improved on his current dose of losartan 100 mg daily in addition to very low-dose beta blocker therapy.  He continues to be bradycardic despite his reduced beta blocker dose from his last office visit. Presently, I will wean and discontinue metoprolol.  He has been taking 12.5 mg twice a day.  He will not take his dose tonight after having taken 12.5 mg dose this a.m.  For the next 2 days he will take 6.25 mg in the morning and then discontinue this altogether.  At present, he will continue amiodarone at 200 mg daily.  I discussed with him  that he was awakening gasping for breath while he was taking naps during the daytime is a reflection of apneic spells that he is experiencing.  I have suggested that if he does need to take a nap that he uses BiPAP therapy.  He will continue to monitor his heart rate and blood pressure at home.  He will continue anti-coagulation therapy with Xarelto.  There is no bleeding.  I will see him in several months for reevaluation.  Time spent: 25 minutes Troy Sine, MD, [facility]  08/27/2016 6:48 PM

## 2016-08-27 NOTE — Patient Instructions (Signed)
Your physician has recommended you make the following change in your medication:  1.) the amiodarone has been decreased to 200 mg ONCE  Daily. ( DO NOT TAKE TONIGHT'S DOSE)  2.) Dr Claiborne Billings recommends that you wean the metoprolol as directed then STOP.   Your physician recommends that you schedule a follow-up appointment in:  3 MONTHS.

## 2016-09-01 ENCOUNTER — Telehealth: Payer: Self-pay | Admitting: Cardiovascular Disease

## 2016-09-01 DIAGNOSIS — Z79899 Other long term (current) drug therapy: Secondary | ICD-10-CM

## 2016-09-01 NOTE — Telephone Encounter (Signed)
BP elevations as noted today and over weekend. Pt given instruction to discontinue metoprolol at recent OV (seen by Dr. Claiborne Billings on 9/6)  Compliant w/ med changes. Pt was instructed at visit to track BPs and note if issues/concerns. Will route to provider for recommendations.

## 2016-09-01 NOTE — Telephone Encounter (Signed)
Called patient to make f/u appt with TK.  Patient states blood pressure has not come down, "medicine is not working".  BP this am prior to taking med was 166/91. Other readings 152/86; 142/88.

## 2016-09-08 NOTE — Telephone Encounter (Addendum)
Returned call to patient-reports BP is still running high since medication change at last OV on 9/6.  BP yesterday 190/93, 166/85, HR 50s-60s.  Last PM 173/90 before medication and today 152/82.  Reports continued pounding HA and reports people at work telling him his face is red or "flushed", denies vision changes, weakness, CP.  Reports he has to take the losartan 50 in the AM and 50 in the PM d/t it causing extreme dizziness if taken at one time.    Advised I would route to MD and clinical pharmacist for further recommendations and f/u with him in the AM.     Request to call [phone] as patient will be at work tomorrow.

## 2016-09-08 NOTE — Telephone Encounter (Signed)
The Change in the Blood pressure Medication is not working , his blood pressure is still elevated 190/93 . Please call at [phone]  Thanks

## 2016-09-09 MED ORDER — HYDROCHLOROTHIAZIDE 12.5 MG PO CAPS: 12.5000 mg | ORAL_CAPSULE | Freq: Every day | ORAL | 3 refills | Status: DC

## 2016-09-09 NOTE — Telephone Encounter (Signed)
Spoke with patient-made aware of recommendations by pharmacist.  Rx sent to pharmacy.  Order placed for lab work in 2-3 weeks.  Pt aware to continue to monitor BP and dizziness, advised to call with any issues/concerns/questions.    Pt reports he will call at the end of this week to schedule BP check with [facility] in 2-3 weeks.

## 2016-09-09 NOTE — Telephone Encounter (Signed)
Attempt to call patient with recommendations-no answer, lmtcb.

## 2016-09-09 NOTE — Telephone Encounter (Signed)
I would not restart metoprolol at this time due to his low HR with that medication. We can start him on hydrochlorothiazide 12.5mg  daily. Have him watch the dizziness and continue to check pressures. Will need him to come for a BMET in 2-3weeks and would recommend we have him get a BP check in hypertension clinic at that time as well.

## 2016-10-06 DIAGNOSIS — M546 Pain in thoracic spine: Secondary | ICD-10-CM | POA: Diagnosis not present

## 2016-10-06 DIAGNOSIS — G894 Chronic pain syndrome: Secondary | ICD-10-CM | POA: Diagnosis not present

## 2016-10-06 DIAGNOSIS — M5106 Intervertebral disc disorders with myelopathy, lumbar region: Secondary | ICD-10-CM | POA: Diagnosis not present

## 2016-10-10 ENCOUNTER — Other Ambulatory Visit: Payer: Self-pay | Admitting: Cardiovascular Disease

## 2016-10-10 NOTE — Telephone Encounter (Signed)
Rx(s) sent to pharmacy electronically.  

## 2016-10-16 ENCOUNTER — Other Ambulatory Visit: Payer: Self-pay | Admitting: Cardiovascular Disease

## 2016-11-03 DIAGNOSIS — M79674 Pain in right toe(s): Secondary | ICD-10-CM | POA: Diagnosis not present

## 2016-11-03 DIAGNOSIS — Z79899 Other long term (current) drug therapy: Secondary | ICD-10-CM | POA: Diagnosis not present

## 2016-11-03 DIAGNOSIS — M059 Rheumatoid arthritis with rheumatoid factor, unspecified: Secondary | ICD-10-CM | POA: Diagnosis not present

## 2016-11-03 DIAGNOSIS — M25571 Pain in right ankle and joints of right foot: Secondary | ICD-10-CM | POA: Diagnosis not present

## 2016-11-10 ENCOUNTER — Other Ambulatory Visit: Payer: Self-pay

## 2016-11-10 MED ORDER — LOSARTAN POTASSIUM 50 MG PO TABS: 50.0000 mg | ORAL_TABLET | Freq: Every day | ORAL | 2 refills | Status: DC

## 2016-11-11 ENCOUNTER — Other Ambulatory Visit: Payer: Self-pay

## 2016-12-05 DIAGNOSIS — J329 Chronic sinusitis, unspecified: Secondary | ICD-10-CM | POA: Diagnosis not present

## 2016-12-08 ENCOUNTER — Ambulatory Visit (INDEPENDENT_AMBULATORY_CARE_PROVIDER_SITE_OTHER): Payer: BLUE CROSS/BLUE SHIELD | Admitting: Cardiovascular Disease

## 2016-12-08 ENCOUNTER — Encounter: Payer: Self-pay | Admitting: Cardiovascular Disease

## 2016-12-08 VITALS — BP 110/70 | HR 52 | Ht 72.0 in | Wt 220.4 lb

## 2016-12-08 DIAGNOSIS — E291 Testicular hypofunction: Secondary | ICD-10-CM | POA: Diagnosis not present

## 2016-12-08 DIAGNOSIS — I10 Essential (primary) hypertension: Secondary | ICD-10-CM

## 2016-12-08 DIAGNOSIS — I2510 Atherosclerotic heart disease of native coronary artery without angina pectoris: Secondary | ICD-10-CM | POA: Diagnosis not present

## 2016-12-08 DIAGNOSIS — E785 Hyperlipidemia, unspecified: Secondary | ICD-10-CM

## 2016-12-08 DIAGNOSIS — R001 Bradycardia, unspecified: Secondary | ICD-10-CM

## 2016-12-08 DIAGNOSIS — I480 Paroxysmal atrial fibrillation: Secondary | ICD-10-CM

## 2016-12-08 DIAGNOSIS — G4733 Obstructive sleep apnea (adult) (pediatric): Secondary | ICD-10-CM

## 2016-12-08 DIAGNOSIS — Z7901 Long term (current) use of anticoagulants: Secondary | ICD-10-CM

## 2016-12-08 DIAGNOSIS — R3121 Asymptomatic microscopic hematuria: Secondary | ICD-10-CM | POA: Diagnosis not present

## 2016-12-08 DIAGNOSIS — N401 Enlarged prostate with lower urinary tract symptoms: Secondary | ICD-10-CM | POA: Diagnosis not present

## 2016-12-08 DIAGNOSIS — N138 Other obstructive and reflux uropathy: Secondary | ICD-10-CM | POA: Diagnosis not present

## 2016-12-08 MED ORDER — AMIODARONE HCL 200 MG PO TABS
ORAL_TABLET | ORAL | 3 refills | Status: DC
Start: 2016-12-08 — End: 2017-09-08

## 2016-12-08 MED ORDER — LOSARTAN POTASSIUM 50 MG PO TABS: 50.0000 mg | ORAL_TABLET | Freq: Two times a day (BID) | ORAL | 3 refills | Status: DC

## 2016-12-08 MED ORDER — HYDROCHLOROTHIAZIDE 12.5 MG PO CAPS: 12.5000 mg | ORAL_CAPSULE | ORAL | 3 refills | Status: DC

## 2016-12-08 NOTE — Progress Notes (Signed)
Patient ID: CAYLOR TALLARICO, male   DOB: February 25, 1951, 65 y.o.   MRN: [id number]    Primary M.D.: Dr. Deland Pretty  HPI: TYMEIR WEATHINGTON is a 65 y.o. male who presents to the office for a 61monthfollowup cardiology evaluation.  Mr. ETiffany Kocherhas a history of mild CAD initially found in 2000. His last cardiac catheterization in May 2005 revealed  mild nonobstructive CAD involving the LAD, intermediate, and RCA treated medically. He has a history of hyperlipidemia. He's been diagnosed with rheumatoid arthritis. He had done well with beta blocker therapy for many years but last year stopped taking this when his prescription had run out and he was seen as an add-on on 01/24/2014.  He was found to be in atrial fibrillation with ventricular rate at 110 beats per minute. He was restarted on low-dose metoprolol tartrate at 25 mg twice a day. Laboratory showed a TSH that was normal at 1.12. He had normal CMP profile. Creatinine was 0.85. He was started on Xarelto 20 mg with  normal hemoglobin and hematocrit. I further increased his metoprolol to 50 mg twice a day. An echo Doppler study  on 02/23/2014  showed an ejection fraction of 50-55%. There was mild to moderate mitral regurgitation. His left atrium measured 42 mm.  He was initially started on Propofenone150 mg every 8 hours and when attempted to be titrated to 225 mg 3 times a day he was unable to tolerate the increased dose of Propofenone due to nausea and reduced his dose back to 150 mg.   Due to concerns for significant obstructive sleep apnea with excessive daytime sleepiness he  underwent a sleep study which confirmed moderate sleep apnea with an AHI of 15.1.  He dropped his oxygen 87%.  There was heavy snoring.  He had a significant delay in ultimately undergoing a CPAP titration due to his canceling of the study, but ultimately a CPAP trial was done on 06/29/2014.  He was found to develop central apneic events in ultimately was switched to BiPAP.  He was  titrated up to an IPAP of 16 and EPAP of 12, but continues to have centrals during sleep-wake junctions.  He has an AirCurve 10 Auto ResMed BiPAP unit and his current settings are for minimum EPAP of 10 with a maximum IPAP of 20.  A download was obtained from October 16 through 11/04/2014.  He met compliance with days of use being 80% but days greater than 4 hours was only 27%.  He was averaging 3 hours and 43 minutes of use.  Upon further questioning, he states as the pressure would build up  he would develop significant leakage  around the seal in the mask and mask would essentially blow off his face.  For this reason, he has not been able to be compliant.  He brings his unit with him and mask to the office today for further evaluation and assessment. A review of his download indicates a significant increase leak at 97.6.  His AHI was 13.4.  He underwent an initial cardioversion  on 11/20/2014.  At that time, he converted with just 1 shock at 120 J to normal sinus rhythm.   Over the past year, he has not been very compliant with his medications.  He was only intermittently using BiPAP 2 times per week.  Instead of  taking his metoprolol tartrate 75 mg twice a day he was only taking this 50 mg daily.  He has continued Xarelto 20 mg for anticoagulation.  He began to notice that he was feeling more chest fluttering.  He denied chest pain.  He denies PND or orthopnea.  He had not been using BiPAP.  He has been using [facility] for his DME company.  When I saw him in November 2016 he was in atrial fibrillation of questionable duration. His resting pulse was 101 and I increased his metoprolol to 75 mg twice a day. He has felt improved. He also has resumed using BiPAP therapy and admits that he feels so much better now that he has been using this on a daily basis.  When seen in December.  He was started on amiodarone 200 mg a day for 1 week and was then titrated to 2 mg twice a day.  He continued to be  in atrial fibrillation.  On 02/19/2016 he underwent successful DC cardioversion and required 3 shocks at 120, 150, and ultimately cardioverted at 200 J.  His postprocedure ECG showed sinus bradycardia.  Subsequently, he has been on a reduced dose of metoprolol at 50 mg twice a day but has continued to take the amiodarone 20 mg twice a day.  He omits to 100% compliance with CPAP use.  He continues to be on Xarelto for anticoagulation.  He admits to resting pulse is in the 40s to at times. When I saw for post cardioversion follow-up .  I reduced his metoprolol from 50 mg twice a day to 25 mg twice a day.  I again discussed importance of continued treatment for his obstructive sleep apnea.  Presently, he admits to 100% compliance with sleep apnea.  He has been under increased stress since his fiance walked out on him and began a relationship 2 weeks later with another male.  He does note fatigue.  He continues to work at [employer] in the [place] and works approximate 35-40 hours per week.    He developed a kidney stone and underwent successful lithotripsy and held Xarelto for the procedure.  He is still working at [employer].  He has moved into an apartment complex.  Over the past month, he admits to a 10 pound weight gain.  He is walking over 20,000 steps per day.  He denies chest pain or breakthrough atrial fibrillation.  He admits to 100% compliance with BiPAP therapy.  He was recently started on Humira for his rheumatoid arthritis and is followed by Dr. Trudie Reed at [facility].  He admits to sleeping well and continues to have 100% compliance with BiPAP use.  He does note some fatigue and is wanting to see if he can have his amiodarone dose reduced.  He is no longer taking beta blocker therapy.  Not been taking hydrochlorothiazide and has been taking losartan 50 mg twice a day.  Age at home typically his systolic blood pressure is around 150.  He presents for evaluation.  Past  Surgical History:  Procedure Laterality Date  . BACK SURGERY  2005  . CARDIAC CATHETERIZATION  05/14/2004   Contiued medical therapy  . CARDIOVASCULAR STRESS TEST  09/19/2009   Observed defect is consistent with diaphragmatic attentuation. EKG negative for ischemia.  Marland Kitchen CARDIOVERSION N/A 11/20/2014   Procedure: CARDIOVERSION;  Surgeon: Troy Sine, MD;  Location: [facility] ENDOSCOPY;  Service: Cardiovascular;  Laterality: N/A;  . CARDIOVERSION N/A 02/19/2016   Procedure: CARDIOVERSION;  Surgeon: Troy Sine, MD;  Location: [place];  Service: Cardiovascular;  Laterality: N/A;  . CERVICAL FUSION  2005  . KIDNEY STONE SURGERY  1985, 2007   stent insertion, stone basket   . TONSILLECTOMY     as a child  . TRANSTHORACIC ECHOCARDIOGRAM  03/29/2008   EF 67%, mild mitral and tricuspid valve regurg.    Allergies  Allergen Reactions  . Penicillins     Current Outpatient Prescriptions  Medication Sig Dispense Refill  . amiodarone (PACERONE) 200 MG tablet Take 1 tablet alternating with 1/2 tablet daily 180 tablet 3  . AXIRON 30 MG/ACT SOLN Apply 2 sprays topically daily.    . celecoxib (CELEBREX) 200 MG capsule Take 1 capsule by mouth daily.    Marland Kitchen CIALIS 5 MG tablet Take 5 mg by mouth daily.  2  . ENBREL SURECLICK 50 MG/ML injection     . fexofenadine (ALLEGRA) 180 MG tablet Take 180 mg by mouth daily.    . Glucos-Chondroit-Hyaluron-MSM (GLUCOSAMINE CHONDROITIN JOINT PO) Take 2 tablets by mouth 2 (two) times daily.    . hydrochlorothiazide (MICROZIDE) 12.5 MG capsule Take 1 capsule (12.5 mg total) by mouth every other day. 90 capsule 3  . losartan (COZAAR) 50 MG tablet Take 1 tablet (50 mg total) by mouth 2 (two) times daily. 180 tablet 3  . oxyCODONE (OXY IR/ROXICODONE) 5 MG immediate release tablet Take 1 tablet by mouth as needed.    . rosuvastatin (CRESTOR) 20 MG tablet Take 1 tablet (20 mg total) by mouth daily. 90 tablet 3  . sertraline (ZOLOFT) 100 MG tablet 100 mg once.    .  tamsulosin (FLOMAX) 0.4 MG CAPS capsule Take 0.4 mg by mouth.    Alveda Reasons 20 MG TABS tablet TAKE 1 TABLET (20 MG TOTAL) BY MOUTH DAILY WITH SUPPER. 30 tablet 5   No current facility-administered medications for this visit.     Social History   Social History  . Marital status: Married    Spouse name: N/A  . Number of children: N/A  . Years of education: N/A   Occupational History  . Not on file.   Social History Main Topics  . Smoking status: Former Smoker    Types: Cigarettes    Quit date: 01/25/2004  . Smokeless tobacco: Never Used  . Alcohol use 2.0 oz/week    4 Standard drinks or equivalent per week  . Drug use: Unknown  . Sexual activity: Not on file   Other Topics Concern  . Not on file   Social History Narrative  . No narrative on file   Socially he is widowed for 3 years.  There is no tobacco use.  He had recently broke up with his fiance.  Family History  Problem Relation Age of Onset  . Diabetes Mother     ROS General: Negative; No fevers, chills, or night sweats;  HEENT: Negative; No changes in vision or hearing, sinus congestion, difficulty swallowing Pulmonary: Negative; No cough, wheezing, shortness of breath, hemoptysis Cardiovascular:  unaware of his atrial fibrillation; No chest pain, presyncope, syncope,  GI: Negative; No nausea, vomiting, diarrhea, or abdominal pain GU: Negative; No dysuria, hematuria, or difficulty voiding Musculoskeletal: Positive for cervical and lumbar disc disease; Hematologic/Oncology: Negative; no easy bruising, bleeding Endocrine: Negative; no heat/cold intolerance; no diabetes Neuro: Negative; no changes in balance, headaches Skin: Negative; No rashes or skin lesions Psychiatric: Negative; No behavioral problems, depression Sleep: Positive for moderate complex obstructive sleep apnea, no residual daytime sleepiness, hypersomnolence; negative for bruxism, restless legs, hypnogognic hallucinations, no cataplexy Other  comprehensive 14 point system review is negative.  PE BP 110/70   Pulse Marland Kitchen)  52   Ht 6' (1.829 m)   Wt 220 lb 6.4 oz (100 kg)   BMI 29.89 kg/m    Tells me want to see urologist this morning.  His blood pressure was 948 systolically.  Repeat blood pressure by me 138/80.   Wt Readings from Last 3 Encounters:  12/08/16 220 lb 6.4 oz (100 kg)  08/27/16 216 lb 3.2 oz (98.1 kg)  06/12/16 208 lb (94.3 kg)   General: Alert, oriented, no distress.  Skin: normal turgor, no rashes HEENT: Normocephalic, atraumatic. Pupils round and reactive; sclera anicteric;no lid lag. Extraocular muscles intact;; no xanthelasmas. Nose without nasal septal hypertrophy Mouth/Parynx benign; Mallinpatti scale 3 Neck: No JVD, no carotid bruits; normal carotid upstroke Lungs: clear to ausculatation and percussion; no wheezing or rales Chest wall: no tenderness to palpitation Heart: RR and bradycardic at 52 bpm which is improved from his previous evaluation when it was in the 30s to 40s.  s1 s2 normal; 5-46 systolic murmur at apex; no diastolic murmur, rub thrills or heaves Abdomen: soft, nontender; no hepatosplenomehaly, BS+; abdominal aorta nontender and not dilated by palpation. Back: no CVA tenderness Pulses 2+ Extremities: no clubbing cyanosis or edema, Homan's sign negative  Neurologic: grossly nonfocal; cranial nerves grossly normal. Psychologic: normal affect and mood.  ECG (independently read by me): Sinus bradycardia 52 bpm.  QTc interval 396 ms.  PR interval 168 ms.  No ectopy.  September 2017 ECG (independently read by me): Marked sinus bradycardia with a heart rate at 37 bpm.  PR interval 180 ms.  QTc interval 414 ms.  May 2017 ECG (independently read by me): Marked sinus bradycardia with a ventricular rate of 37 bpm with sinus arrhythmia  March 2017 ECG (independently read by me): Marked sinus bradycardia 41 bpm.  Nondiagnostic T changes.  QTc interval 528 ms.  12/14/2015 ECG (independently  read by me):  Atrial fibrillation at 90 bpm.  Nonspecific T changes.  QTc interval 442 ms.  November 12 2015 ECG (independently read by me): Atrial fibrillation with a rapid ventricular response at 10 1 bpm.  Nonspecific ST-T changes.  11/09/2014 ECG (independently read by me): Atrial fibrillation with controlled ventricular response in the 70s.  QTc interval 407 ms.  June 2015 ECG (independently read by me): Atrial fibrillation with a ventricular rate at 100 beats per minute.  QTc interval 428 ms.  Nonspecific T changes  05/08/2014 ECG (independently read by me): atrial fibrillation at 94 beats per minute.  QTc interval 425 ms.  02/23/2014 ECG (independently read by me) atrial fibrillation at 98 beats per minute. Nonspecific ST-T changes   Prior 02/08/2014 ECG (independently read by me): Atrial fibrillation with a rapid ventricular response at 128 beats per minute with nonspecific ST-T changes.   LABS: Laboratory from [facility] has been reviewed.  BMP Latest Ref Rng & Units 02/13/2016 12/10/2015 11/15/2014  Glucose 65 - 99 mg/dL 73 91 142(H)  BUN 7 - 25 mg/dL 18 24 21   Creatinine 0.70 - 1.25 mg/dL 0.99 0.85 1.00  Sodium 135 - 146 mmol/L 144 139 143  Potassium 3.5 - 5.3 mmol/L 4.2 4.4 4.1  Chloride 98 - 110 mmol/L 106 105 109  CO2 20 - 31 mmol/L 27 28 25   Calcium 8.6 - 10.3 mg/dL 8.7 8.8 9.2   Hepatic Function Latest Ref Rng & Units 12/10/2015 11/15/2014 01/24/2014  Total Protein 6.1 - 8.1 g/dL 6.7 6.9 6.7  Albumin 3.6 - 5.1 g/dL 4.1 4.2 4.1  AST 10 - 35 U/L  20 22 23   ALT 9 - 46 U/L 19 21 21   Alk Phosphatase 40 - 115 U/L 53 60 66  Total Bilirubin 0.2 - 1.2 mg/dL 0.7 0.8 0.4   CBC Latest Ref Rng & Units 02/13/2016 12/10/2015 11/15/2014  WBC 4.0 - 10.5 K/uL 6.4 5.4 6.0  Hemoglobin 13.0 - 17.0 g/dL 14.8 13.5 16.1  Hematocrit 39.0 - 52.0 % 44.6 39.4 47.5  Platelets 150 - 400 K/uL 202 177 212   Lab Results  Component Value Date   MCV 94.7 02/13/2016   MCV 94.0 12/10/2015     MCV 91.3 11/15/2014   Lab Results  Component Value Date   TSH 1.113 12/10/2015  No results found for: HGBA1C   Lipid Panel     Component Value Date/Time   CHOL 145 12/10/2015 1330   TRIG 77 12/10/2015 1330   HDL 45 12/10/2015 1330   CHOLHDL 3.2 12/10/2015 1330   VLDL 15 12/10/2015 1330   LDLCALC 85 12/10/2015 1330    RADIOLOGY: No results found.  I reviewed his urologic evaluation for renal calculi.  He tolerated lithotripsy without cardiovascular compromise.   IMPRESSION:  1. Coronary artery disease involving native coronary artery of native heart without angina pectoris   2. Essential hypertension   3. Paroxysmal atrial fibrillation (HCC)   4. Dyslipidemia   5. OSA (obstructive sleep apnea)   6. Chronic anticoagulation - Xarelto, CHADSVASC=2   7. Bradycardia    ASSESSMENT AND PLAN: Mr. Jaydrien Wassenaar is a 65 year old gentleman who has mild nonobstructive coronary disease. He developed atrial fibrillation after stopping his metoprolol in 2015.  An echo Doppler study confirmed an ejection fraction of 50-55% with mild-to-moderate mitral regurgitation and mild biatrial enlargement. Remotely he had been on propofenone and metoprolol.   He has been on BiPAP auto unit for complex sleep apnea .  Since his cardioversion he continues to use BiPAP therapy with 100% compliance. When I last saw him, he was significant only bradycardic and his beta blocker therapy was discontinued.  His resting heart rate is now 52 bpm.  I'm suggesting that he slightly reduce amiodarone from 20 mg daily to 20 mg alternating with 100, no grams every other day.  I also have suggested he try taking HCTZ 12.5 mg every other day particularly with his more consistent blood pressure systolic level at 993 on.  I discussed with him the new hypertensive guidelines.  We discussed weight loss and continued exercise.  He feels better using BiPAP therapy.  I will see him in 4 months for reevaluation.    Time spent: 25  minutes Troy Sine, MD, [facility]  12/08/2016 6:04 PM

## 2016-12-08 NOTE — Patient Instructions (Signed)
Your physician has recommended you make the following change in your medication:   1.) the HCTZ 12.5 mg has been changed to 1 tablet every other day.  2.) the amiodarone has been changed to 1 tablet alternating to 1/2 tablet daily.  Your physician recommends that you schedule a follow-up appointment in: 4 months.

## 2016-12-31 ENCOUNTER — Telehealth: Payer: Self-pay | Admitting: Pharmacist

## 2016-12-31 ENCOUNTER — Other Ambulatory Visit: Payer: Self-pay | Admitting: Cardiovascular Disease

## 2016-12-31 DIAGNOSIS — Z7901 Long term (current) use of anticoagulants: Secondary | ICD-10-CM

## 2016-12-31 NOTE — Telephone Encounter (Signed)
Message left on patient's phone. Need BMET and CBC prior to next Xarelto 20mg  refill. 30 day supply authorized today.   Orders for BMET and CBC were mailed to patient.

## 2016-12-31 NOTE — Telephone Encounter (Signed)
Needs f/u lab work before any additional refills

## 2016-12-31 NOTE — Telephone Encounter (Signed)
Patient overdue for BMET since HCTZ dose was adjusted in September/2017. Additional recommendation to order CBC (last lab work was 02/13/1016)

## 2017-01-09 DIAGNOSIS — G894 Chronic pain syndrome: Secondary | ICD-10-CM | POA: Diagnosis not present

## 2017-01-09 DIAGNOSIS — Z79899 Other long term (current) drug therapy: Secondary | ICD-10-CM | POA: Diagnosis not present

## 2017-01-09 DIAGNOSIS — M546 Pain in thoracic spine: Secondary | ICD-10-CM | POA: Diagnosis not present

## 2017-01-09 DIAGNOSIS — M47897 Other spondylosis, lumbosacral region: Secondary | ICD-10-CM | POA: Diagnosis not present

## 2017-01-13 ENCOUNTER — Other Ambulatory Visit: Payer: Self-pay | Admitting: Orthopaedic Surgery

## 2017-01-13 DIAGNOSIS — M47817 Spondylosis without myelopathy or radiculopathy, lumbosacral region: Secondary | ICD-10-CM

## 2017-01-30 DIAGNOSIS — I4891 Unspecified atrial fibrillation: Secondary | ICD-10-CM | POA: Diagnosis not present

## 2017-01-30 DIAGNOSIS — E349 Endocrine disorder, unspecified: Secondary | ICD-10-CM | POA: Diagnosis not present

## 2017-01-30 DIAGNOSIS — I10 Essential (primary) hypertension: Secondary | ICD-10-CM | POA: Diagnosis not present

## 2017-01-30 DIAGNOSIS — I2510 Atherosclerotic heart disease of native coronary artery without angina pectoris: Secondary | ICD-10-CM | POA: Diagnosis not present

## 2017-01-30 DIAGNOSIS — E7800 Pure hypercholesterolemia, unspecified: Secondary | ICD-10-CM | POA: Diagnosis not present

## 2017-01-30 DIAGNOSIS — F329 Major depressive disorder, single episode, unspecified: Secondary | ICD-10-CM | POA: Diagnosis not present

## 2017-02-06 DIAGNOSIS — H43393 Other vitreous opacities, bilateral: Secondary | ICD-10-CM | POA: Diagnosis not present

## 2017-02-06 DIAGNOSIS — H04123 Dry eye syndrome of bilateral lacrimal glands: Secondary | ICD-10-CM | POA: Diagnosis not present

## 2017-02-06 DIAGNOSIS — H2513 Age-related nuclear cataract, bilateral: Secondary | ICD-10-CM | POA: Diagnosis not present

## 2017-02-06 DIAGNOSIS — H1032 Unspecified acute conjunctivitis, left eye: Secondary | ICD-10-CM | POA: Diagnosis not present

## 2017-02-08 ENCOUNTER — Other Ambulatory Visit: Payer: Self-pay | Admitting: Cardiovascular Disease

## 2017-02-11 DIAGNOSIS — Z79899 Other long term (current) drug therapy: Secondary | ICD-10-CM | POA: Diagnosis not present

## 2017-02-11 DIAGNOSIS — M25571 Pain in right ankle and joints of right foot: Secondary | ICD-10-CM | POA: Diagnosis not present

## 2017-02-11 DIAGNOSIS — M7989 Other specified soft tissue disorders: Secondary | ICD-10-CM | POA: Diagnosis not present

## 2017-02-11 DIAGNOSIS — M059 Rheumatoid arthritis with rheumatoid factor, unspecified: Secondary | ICD-10-CM | POA: Diagnosis not present

## 2017-02-20 ENCOUNTER — Encounter: Payer: Self-pay | Admitting: *Deleted

## 2017-02-20 ENCOUNTER — Telehealth: Payer: Self-pay | Admitting: *Deleted

## 2017-02-20 NOTE — Telephone Encounter (Signed)
Unable to reach patient to discuss lab results received from outside lab. Letter mailed to patient.

## 2017-02-23 DIAGNOSIS — R3912 Poor urinary stream: Secondary | ICD-10-CM | POA: Diagnosis not present

## 2017-02-23 DIAGNOSIS — Z87442 Personal history of urinary calculi: Secondary | ICD-10-CM | POA: Diagnosis not present

## 2017-02-23 DIAGNOSIS — R972 Elevated prostate specific antigen [PSA]: Secondary | ICD-10-CM | POA: Diagnosis not present

## 2017-02-23 DIAGNOSIS — E291 Testicular hypofunction: Secondary | ICD-10-CM | POA: Diagnosis not present

## 2017-02-23 DIAGNOSIS — N5201 Erectile dysfunction due to arterial insufficiency: Secondary | ICD-10-CM | POA: Diagnosis not present

## 2017-02-23 DIAGNOSIS — N401 Enlarged prostate with lower urinary tract symptoms: Secondary | ICD-10-CM | POA: Diagnosis not present

## 2017-03-12 DIAGNOSIS — D485 Neoplasm of uncertain behavior of skin: Secondary | ICD-10-CM | POA: Diagnosis not present

## 2017-03-12 DIAGNOSIS — C44529 Squamous cell carcinoma of skin of other part of trunk: Secondary | ICD-10-CM | POA: Diagnosis not present

## 2017-04-08 DIAGNOSIS — G894 Chronic pain syndrome: Secondary | ICD-10-CM | POA: Diagnosis not present

## 2017-04-08 DIAGNOSIS — M546 Pain in thoracic spine: Secondary | ICD-10-CM | POA: Diagnosis not present

## 2017-04-08 DIAGNOSIS — M47897 Other spondylosis, lumbosacral region: Secondary | ICD-10-CM | POA: Diagnosis not present

## 2017-04-20 ENCOUNTER — Encounter: Payer: Self-pay | Admitting: Cardiovascular Disease

## 2017-04-20 DIAGNOSIS — L723 Sebaceous cyst: Secondary | ICD-10-CM | POA: Diagnosis not present

## 2017-04-20 DIAGNOSIS — L905 Scar conditions and fibrosis of skin: Secondary | ICD-10-CM | POA: Diagnosis not present

## 2017-04-20 DIAGNOSIS — C44529 Squamous cell carcinoma of skin of other part of trunk: Secondary | ICD-10-CM | POA: Diagnosis not present

## 2017-05-01 DIAGNOSIS — Z9989 Dependence on other enabling machines and devices: Secondary | ICD-10-CM | POA: Diagnosis not present

## 2017-05-01 DIAGNOSIS — R635 Abnormal weight gain: Secondary | ICD-10-CM | POA: Diagnosis not present

## 2017-05-01 DIAGNOSIS — R5383 Other fatigue: Secondary | ICD-10-CM | POA: Diagnosis not present

## 2017-05-01 DIAGNOSIS — G4733 Obstructive sleep apnea (adult) (pediatric): Secondary | ICD-10-CM | POA: Diagnosis not present

## 2017-05-21 ENCOUNTER — Other Ambulatory Visit: Payer: Self-pay | Admitting: Cardiovascular Disease

## 2017-05-21 DIAGNOSIS — M059 Rheumatoid arthritis with rheumatoid factor, unspecified: Secondary | ICD-10-CM | POA: Diagnosis not present

## 2017-05-21 DIAGNOSIS — Z79899 Other long term (current) drug therapy: Secondary | ICD-10-CM | POA: Diagnosis not present

## 2017-05-21 DIAGNOSIS — E669 Obesity, unspecified: Secondary | ICD-10-CM | POA: Diagnosis not present

## 2017-05-21 DIAGNOSIS — M7989 Other specified soft tissue disorders: Secondary | ICD-10-CM | POA: Diagnosis not present

## 2017-05-21 DIAGNOSIS — E785 Hyperlipidemia, unspecified: Secondary | ICD-10-CM | POA: Diagnosis not present

## 2017-05-21 DIAGNOSIS — M25571 Pain in right ankle and joints of right foot: Secondary | ICD-10-CM | POA: Diagnosis not present

## 2017-05-21 DIAGNOSIS — Z6831 Body mass index (BMI) 31.0-31.9, adult: Secondary | ICD-10-CM | POA: Diagnosis not present

## 2017-05-21 NOTE — Telephone Encounter (Signed)
REFILL 

## 2017-05-25 ENCOUNTER — Telehealth: Payer: Self-pay | Admitting: Cardiovascular Disease

## 2017-05-25 NOTE — Telephone Encounter (Signed)
New message      Pt c/o medication issue:  1. Name of Medication:  xarelto  2. How are you currently taking this medication (dosage and times per day)?  20mg  daily  3. Are you having a reaction (difficulty breathing--STAT)?  no 4. What is your medication issue?  Pt went on medicare in April.  His presc for xarelto is now $300.  He has 3 pills left.  Can Dr Claiborne Billings prescribe something else cheaper or can he get samples until he applies for patient assistance and gets accepted.  Please call

## 2017-05-25 NOTE — Telephone Encounter (Signed)
Medication samples have been provided to the patient.  Drug name: Xarelto 20mg   Qty: 14 days LOT: [id number]  Exp.Date: 03/20  Samples left at front desk for patient pick-up. Patient notified and will bring filled out patient assistance forms to office when he picks up.

## 2017-05-28 DIAGNOSIS — M47896 Other spondylosis, lumbar region: Secondary | ICD-10-CM | POA: Diagnosis not present

## 2017-05-28 DIAGNOSIS — M545 Low back pain: Secondary | ICD-10-CM | POA: Diagnosis not present

## 2017-05-28 DIAGNOSIS — G894 Chronic pain syndrome: Secondary | ICD-10-CM | POA: Diagnosis not present

## 2017-06-02 ENCOUNTER — Other Ambulatory Visit: Payer: Self-pay | Admitting: *Deleted

## 2017-06-02 MED ORDER — RIVAROXABAN 20 MG PO TABS
ORAL_TABLET | ORAL | 3 refills | Status: DC
Start: 2017-06-02 — End: 2017-09-08

## 2017-06-02 NOTE — Telephone Encounter (Signed)
Printed out xarelto prescription to mail to patient for patient assistance application.

## 2017-06-08 ENCOUNTER — Telehealth: Payer: Self-pay | Admitting: Cardiovascular Disease

## 2017-06-08 NOTE — Telephone Encounter (Signed)
Left detailed message (DPR)pt needs to call pt assistance program and get status. Currently we are out of samples, left message to call back if he is out of samples

## 2017-06-08 NOTE — Telephone Encounter (Signed)
New message     Pt is checking on paper work for [place] - to get patient assistance but has not heard anything in 2 weeks

## 2017-06-10 ENCOUNTER — Telehealth: Payer: Self-pay | Admitting: *Deleted

## 2017-06-10 NOTE — Telephone Encounter (Signed)
Left message for patient, assistance paperwork and script have been placed in the mail in the envelop the patient provided.

## 2017-06-11 DIAGNOSIS — M0609 Rheumatoid arthritis without rheumatoid factor, multiple sites: Secondary | ICD-10-CM | POA: Diagnosis not present

## 2017-06-25 DIAGNOSIS — M0609 Rheumatoid arthritis without rheumatoid factor, multiple sites: Secondary | ICD-10-CM | POA: Diagnosis not present

## 2017-07-09 DIAGNOSIS — M0609 Rheumatoid arthritis without rheumatoid factor, multiple sites: Secondary | ICD-10-CM | POA: Diagnosis not present

## 2017-07-17 DIAGNOSIS — M7989 Other specified soft tissue disorders: Secondary | ICD-10-CM | POA: Diagnosis not present

## 2017-07-17 DIAGNOSIS — Z6831 Body mass index (BMI) 31.0-31.9, adult: Secondary | ICD-10-CM | POA: Diagnosis not present

## 2017-07-17 DIAGNOSIS — E669 Obesity, unspecified: Secondary | ICD-10-CM | POA: Diagnosis not present

## 2017-07-17 DIAGNOSIS — M0609 Rheumatoid arthritis without rheumatoid factor, multiple sites: Secondary | ICD-10-CM | POA: Diagnosis not present

## 2017-07-17 DIAGNOSIS — M25571 Pain in right ankle and joints of right foot: Secondary | ICD-10-CM | POA: Diagnosis not present

## 2017-07-17 DIAGNOSIS — Z79899 Other long term (current) drug therapy: Secondary | ICD-10-CM | POA: Diagnosis not present

## 2017-08-06 DIAGNOSIS — M0609 Rheumatoid arthritis without rheumatoid factor, multiple sites: Secondary | ICD-10-CM | POA: Diagnosis not present

## 2017-08-12 DIAGNOSIS — F329 Major depressive disorder, single episode, unspecified: Secondary | ICD-10-CM | POA: Diagnosis not present

## 2017-08-12 DIAGNOSIS — I2510 Atherosclerotic heart disease of native coronary artery without angina pectoris: Secondary | ICD-10-CM | POA: Diagnosis not present

## 2017-08-12 DIAGNOSIS — E7800 Pure hypercholesterolemia, unspecified: Secondary | ICD-10-CM | POA: Diagnosis not present

## 2017-08-12 DIAGNOSIS — I10 Essential (primary) hypertension: Secondary | ICD-10-CM | POA: Diagnosis not present

## 2017-08-12 DIAGNOSIS — Z23 Encounter for immunization: Secondary | ICD-10-CM | POA: Diagnosis not present

## 2017-08-19 ENCOUNTER — Encounter: Payer: Self-pay | Admitting: Neurology

## 2017-09-06 ENCOUNTER — Other Ambulatory Visit: Payer: Self-pay | Admitting: Cardiovascular Disease

## 2017-09-07 NOTE — Telephone Encounter (Signed)
Rx(s) sent to pharmacy electronically.  

## 2017-09-08 ENCOUNTER — Ambulatory Visit (INDEPENDENT_AMBULATORY_CARE_PROVIDER_SITE_OTHER): Payer: Medicare Other | Admitting: Cardiovascular Disease

## 2017-09-08 ENCOUNTER — Encounter: Payer: Self-pay | Admitting: Cardiovascular Disease

## 2017-09-08 VITALS — BP 142/90 | HR 46 | Ht 72.0 in | Wt 218.0 lb

## 2017-09-08 DIAGNOSIS — I10 Essential (primary) hypertension: Secondary | ICD-10-CM | POA: Diagnosis not present

## 2017-09-08 DIAGNOSIS — Z5181 Encounter for therapeutic drug level monitoring: Secondary | ICD-10-CM | POA: Diagnosis not present

## 2017-09-08 DIAGNOSIS — I2510 Atherosclerotic heart disease of native coronary artery without angina pectoris: Secondary | ICD-10-CM

## 2017-09-08 DIAGNOSIS — I480 Paroxysmal atrial fibrillation: Secondary | ICD-10-CM

## 2017-09-08 DIAGNOSIS — R001 Bradycardia, unspecified: Secondary | ICD-10-CM

## 2017-09-08 DIAGNOSIS — Z7901 Long term (current) use of anticoagulants: Secondary | ICD-10-CM

## 2017-09-08 DIAGNOSIS — G4733 Obstructive sleep apnea (adult) (pediatric): Secondary | ICD-10-CM | POA: Diagnosis not present

## 2017-09-08 MED ORDER — LOSARTAN POTASSIUM 50 MG PO TABS: 50.0000 mg | ORAL_TABLET | Freq: Two times a day (BID) | ORAL | 3 refills | Status: DC

## 2017-09-08 MED ORDER — RIVAROXABAN 20 MG PO TABS: 20.0000 mg | ORAL_TABLET | Freq: Every day | ORAL | 0 refills | Status: DC

## 2017-09-08 MED ORDER — AMIODARONE HCL 200 MG PO TABS: 200.0000 mg | ORAL_TABLET | Freq: Every day | ORAL | 3 refills | Status: DC

## 2017-09-08 MED ORDER — ROSUVASTATIN CALCIUM 20 MG PO TABS: 20.0000 mg | ORAL_TABLET | Freq: Every day | ORAL | 3 refills | Status: DC

## 2017-09-08 NOTE — Patient Instructions (Signed)
Medication Instructions:  Continue taking amiodarone 200mg  daily  Continue Losartan 50mg  two times daily  Xarelto 20mg  daily-samples have been provided.  Labwork: NONE  Testing/Procedures: Your physician has requested that you have an echocardiogram in 6 MONTHS. Echocardiography is a painless test that uses sound waves to create images of your heart. It provides your doctor with information about the size and shape of your heart and how well your heart's chambers and valves are working. This procedure takes approximately one hour. There are no restrictions for this procedure.    Follow-Up: Your physician wants you to follow-up in: 6 MONTHS with Dr. Claiborne Billings (after echo). You will receive a reminder letter in the mail two months in advance. If you don't receive a letter, please call our office to schedule the follow-up appointment.   Any Other Special Instructions Will Be Listed Below (If Applicable).     If you need a refill on your cardiac medications before your next appointment, please call your pharmacy.

## 2017-09-08 NOTE — Progress Notes (Signed)
Patient ID: Nathan Mcclure, male   DOB: 1951-02-19, 66 y.o.   MRN: [id number]    Primary M.D.: Dr. Deland Mcclure  HPI: Nathan Mcclure is a 66 y.o. male who presents to the office for a 10 month followup cardiology evaluation.  [name] has a history of mild CAD initially found in 2000. His last cardiac catheterization in May 2005 revealed  mild nonobstructive CAD involving the LAD, intermediate, and RCA treated medically. He has a history of hyperlipidemia. He's been diagnosed with rheumatoid arthritis. He had done well with beta blocker therapy for many years but last year stopped taking this when his prescription had run out and he was seen as an add-on on 01/24/2014.  He was found to be in atrial fibrillation with ventricular rate at 110 beats per minute. He was restarted on low-dose metoprolol tartrate at 25 mg twice a day. Laboratory showed a TSH that was normal at 1.12. He had normal CMP profile. Creatinine was 0.85. He was started on Xarelto 20 mg with  normal hemoglobin and hematocrit. I further increased his metoprolol to 50 mg twice a day. An echo Doppler study  on 02/23/2014  showed an ejection fraction of 50-55%. There was mild to moderate mitral regurgitation. His left atrium measured 42 mm.  He was initially started on Propofenone150 mg every 8 hours and when attempted to be titrated to 225 mg 3 times a day he was unable to tolerate the increased dose of Propofenone due to nausea and reduced his dose back to 150 mg.   Due to concerns for significant obstructive sleep apnea with excessive daytime sleepiness he  underwent a sleep study which confirmed moderate sleep apnea with an AHI of 15.1.  He dropped his oxygen 87%.  There was heavy snoring.  He had a significant delay in ultimately undergoing a CPAP titration due to his canceling of the study, but ultimately a CPAP trial was done on 06/29/2014.  He was found to develop central apneic events in ultimately was switched to BiPAP.  He was  titrated up to an IPAP of 16 and EPAP of 12, but continued to have centrals during sleep-wake junctions.  He has an AirCurve 10 Auto ResMed BiPAP unit and his current settings are for minimum EPAP of 10 with a maximum IPAP of 20.  A download was obtained from October 16 through 11/04/2014.  He met compliance with days of use being 80% but days greater than 4 hours was only 27%.  He was averaging 3 hours and 43 minutes of use.  Upon further questioning, he states as the pressure would build up  he would develop significant leakage  around the seal in the mask and mask would essentially blow off his face.  For this reason, he has not been able to be compliant.  He brings his unit with him and mask to the office today for further evaluation and assessment. A review of his download indicates a significant increase leak at 97.6.  His AHI was 13.4.  He underwent an initial cardioversion  on 11/20/2014.  At that time, he converted with just 1 shock at 120 J to normal sinus rhythm.   When I saw him in 2016.  He had not been  compliant with his medications.  He was only intermittently using BiPAP 2 times per week.  Instead of  taking his metoprolol tartrate 75 mg twice a day he was only taking this 50 mg daily.  He has continued Xarelto  20 mg for anticoagulation.  He began to notice that he was feeling more chest fluttering.  He denied chest pain.  He denies PND or orthopnea.  He has been using [facility] for his DME company.  When I saw him in November 2016 he was in atrial fibrillation of questionable duration. His resting pulse was 101 and I increased his metoprolol to 75 mg twice a day. He has felt improved. He also has resumed using BiPAP therapy and admits that he feels so much better now that he has been using this on a daily basis.  When seen in December.  He was started on amiodarone 200 mg a day for 1 week and was then titrated to 2 mg twice a day.  He continued to be in atrial fibrillation.  On  02/19/2016 he underwent successful DC cardioversion and required 3 shocks at 120, 150, and ultimately cardioverted at 200 J.  His postprocedure ECG showed sinus bradycardia.  Subsequently, he has been on a reduced dose of metoprolol at 50 mg twice a day but has continued to take the amiodarone 200 mg twice a day.  He omits to 100% compliance with CPAP use.  He continues to be on Xarelto for anticoagulation.  He admits to resting pulse is in the 40s to at times. When I saw for post cardioversion follow-up  I reduced his metoprolol from 50 mg twice a day to 25 mg twice a day.  I again discussed importance of continued treatment for his obstructive sleep apnea.    He developed a kidney stone and underwent successful lithotripsy and held Xarelto for the procedure.  He is still working at [employer].  He has moved into an apartment complex.  Over the past month, he admits to a 10 pound weight gain.  He is walking over 20,000 steps per day.  He denies chest pain or breakthrough atrial fibrillation.  He admits to 100% compliance with BiPAP therapy.  He was recently started on Humira for his rheumatoid arthritis and is followed by Dr. Trudie Mcclure at [facility] rheumatology.   Since I last saw him, he tells me that he stopped taking Xarelto 2 months ago due to concerns of getting into the "doughnut hole."  He has not been aware of any recurrent episodes of atrial fibrillation.  He has continued to take amiodarone 200 mg daily, losartan 50 mg daily, as well as Dr. Brynda Mcclure and 4 mg.  He is continued to be on Crestor 20 mg for hyperlipidemia.  In May 2018.  Total cholesterol was 166, triglycerides 79, LDL 103.  LFTs were normal.  He continues to use BiPAP with 100% compliance. He presents for evaluation.  Past Surgical History:  Procedure Laterality Date  . BACK SURGERY  2005  . CARDIAC CATHETERIZATION  05/14/2004   Contiued medical therapy  . CARDIOVASCULAR STRESS TEST  09/19/2009   Observed defect is consistent with  diaphragmatic attentuation. EKG negative for ischemia.  Marland Kitchen CARDIOVERSION N/A 11/20/2014   Procedure: CARDIOVERSION;  Surgeon: Troy Sine, MD;  Location: [facility] ENDOSCOPY;  Service: Cardiovascular;  Laterality: N/A;  . CARDIOVERSION N/A 02/19/2016   Procedure: CARDIOVERSION;  Surgeon: Troy Sine, MD;  Location: [place];  Service: Cardiovascular;  Laterality: N/A;  . CERVICAL FUSION  2005  . [place], 2007   stent insertion, stone basket   . TONSILLECTOMY     as a child  . TRANSTHORACIC ECHOCARDIOGRAM  03/29/2008   EF 67%, mild mitral and  tricuspid valve regurg.    Allergies  Allergen Reactions  . Penicillins     Current Outpatient Prescriptions  Medication Sig Dispense Refill  . amiodarone (PACERONE) 200 MG tablet Take 1 tablet (200 mg total) by mouth daily. 90 tablet 3  . celecoxib (CELEBREX) 200 MG capsule Take 1 capsule by mouth daily.    Marland Kitchen doxazosin (CARDURA) 4 MG tablet Take 4 mg by mouth daily.    . fexofenadine (ALLEGRA) 180 MG tablet Take 180 mg by mouth daily.    . Glucos-Chondroit-Hyaluron-MSM (GLUCOSAMINE CHONDROITIN JOINT PO) Take 2 tablets by mouth 2 (two) times daily.    Marland Kitchen losartan (COZAAR) 50 MG tablet Take 1 tablet (50 mg total) by mouth 2 (two) times daily. 180 tablet 3  . oxyCODONE (OXY IR/ROXICODONE) 5 MG immediate release tablet Take 1 tablet by mouth as needed.    . predniSONE (DELTASONE) 5 MG tablet Take 5 mg by mouth daily with breakfast.    . rosuvastatin (CRESTOR) 20 MG tablet Take 1 tablet (20 mg total) by mouth daily. 90 tablet 3  . sertraline (ZOLOFT) 100 MG tablet 100 mg once.    . rivaroxaban (XARELTO) 20 MG TABS tablet Take 1 tablet (20 mg total) by mouth daily with supper. 28 tablet 0   No current facility-administered medications for this visit.     Social History   Social History  . Marital status: Married    Spouse name: N/A  . Number of children: N/A  . Years of education: N/A   Occupational History  . Not on file.    Social History Main Topics  . Smoking status: Former Smoker    Types: Cigarettes    Quit date: 01/25/2004  . Smokeless tobacco: Never Used  . Alcohol use 2.0 oz/week    4 Standard drinks or equivalent per week  . Drug use: Unknown  . Sexual activity: Not on file   Other Topics Concern  . Not on file   Social History Narrative  . No narrative on file   Socially he is widowed for 4 years.  There is no tobacco use.  He had broken up with his fiance.  Family History  Problem Relation Age of Onset  . Diabetes Mother     ROS General: Negative; No fevers, chills, or night sweats;  HEENT: Negative; No changes in vision or hearing, sinus congestion, difficulty swallowing Pulmonary: Negative; No cough, wheezing, shortness of breath, hemoptysis Cardiovascular:  unaware of his atrial fibrillation; No chest pain, presyncope, syncope,  GI: Negative; No nausea, vomiting, diarrhea, or abdominal pain GU: Negative; No dysuria, hematuria, or difficulty voiding Musculoskeletal: Positive for cervical and lumbar disc disease; Hematologic/Oncology: Negative; no easy bruising, bleeding Endocrine: Negative; no heat/cold intolerance; no diabetes Neuro: Negative; no changes in balance, headaches Skin: Negative; No rashes or skin lesions Psychiatric: Negative; No behavioral problems, depression Sleep: Positive for moderate complex obstructive sleep apnea, no residual daytime sleepiness, hypersomnolence; negative for bruxism, restless legs, hypnogognic hallucinations, no cataplexy Other comprehensive 14 point system review is negative.  PE BP (!) 142/90   Pulse (!) 46   Ht 6' (1.829 m)   Wt 218 lb (98.9 kg)   BMI 29.57 kg/m    Repeat blood pressure by me was 166/90.  Wt Readings from Last 3 Encounters:  09/08/17 218 lb (98.9 kg)  12/08/16 220 lb 6.4 oz (100 kg)  08/27/16 216 lb 3.2 oz (98.1 kg)    General: Alert, oriented, no distress.  Skin: normal turgor, no  rashes, warm and  dry HEENT: Normocephalic, atraumatic. Pupils equal round and reactive to light; sclera anicteric; extraocular muscles intact;  Nose without nasal septal hypertrophy Mouth/Parynx benign; Mallinpatti scale 3 Neck: No JVD, no carotid bruits; normal carotid upstroke Lungs: clear to ausculatation and percussion; no wheezing or rales Chest wall: without tenderness to palpitation Heart: PMI not displaced, bradycardiac without ectopy, s1 s2 normal, 1-2 /6 systolic murmur, no diastolic murmur, no rubs, gallops, thrills, or heaves Abdomen: soft, nontender; no hepatosplenomehaly, BS+; abdominal aorta nontender and not dilated by palpation. Back: no CVA tenderness Pulses 2+ Musculoskeletal: full range of motion, normal strength, no joint deformities Extremities: no clubbing cyanosis or edema, Homan's sign negative  Neurologic: grossly nonfocal; Cranial nerves grossly wnl Psychologic: Normal mood and affect    ECG (independently read by me): sinus bradycardia at 46 bpm with sinus arrhythmia.  Normal intervals.  December 2017 ECG (independently read by me): Sinus bradycardia 52 bpm.  QTc interval 396 ms.  PR interval 168 ms.  No ectopy.  September 2017 ECG (independently read by me): Marked sinus bradycardia with a heart rate at 37 bpm.  PR interval 180 ms.  QTc interval 414 ms.  May 2017 ECG (independently read by me): Marked sinus bradycardia with a ventricular rate of 37 bpm with sinus arrhythmia  March 2017 ECG (independently read by me): Marked sinus bradycardia 41 bpm.  Nondiagnostic T changes.  QTc interval 528 ms.  12/14/2015 ECG (independently read by me):  Atrial fibrillation at 90 bpm.  Nonspecific T changes.  QTc interval 442 ms.  November 12 2015 ECG (independently read by me): Atrial fibrillation with a rapid ventricular response at 10 1 bpm.  Nonspecific ST-T changes.  11/09/2014 ECG (independently read by me): Atrial fibrillation with controlled ventricular response in the 70s.  QTc  interval 407 ms.  June 2015 ECG (independently read by me): Atrial fibrillation with a ventricular rate at 100 beats per minute.  QTc interval 428 ms.  Nonspecific T changes  05/08/2014 ECG (independently read by me): atrial fibrillation at 94 beats per minute.  QTc interval 425 ms.  02/23/2014 ECG (independently read by me) atrial fibrillation at 98 beats per minute. Nonspecific ST-T changes   Prior 02/08/2014 ECG (independently read by me): Atrial fibrillation with a rapid ventricular response at 128 beats per minute with nonspecific ST-T changes.   LABS: Laboratory from [facility] from 05/21/2017 has been reviewed.  H/H 13.2/41.0.  C-reactive protein 1.8.  BUN 21, creatinine 0.96.  LFTs normal.  BMP Latest Ref Rng & Units 02/13/2016 12/10/2015 11/15/2014  Glucose 65 - 99 mg/dL 73 91 142(H)  BUN 7 - 25 mg/dL _0 Creatinine 0.70 - 1.25 mg/dL 0.99 0.85 1.00  Sodium 135 - 146 mmol/L 144 139 143  Potassium 3.5 - 5.3 mmol/L 4.2 4.4 4.1  Chloride 98 - 110 mmol/L 106 105 109  CO2 20 - 31 mmol/L _1 Calcium 8.6 - 10.3 mg/dL 8.7 8.8 9.2   Hepatic Function Latest Ref Rng & Units 12/10/2015 11/15/2014 01/24/2014  Total Protein 6.1 - 8.1 g/dL 6.7 6.9 6.7  Albumin 3.6 - 5.1 g/dL 4.1 4.2 4.1  AST 10 - 35 U/L _2 ALT 9 - 46 U/L _3 Alk Phosphatase 40 - 115 U/L 53 60 66  Total Bilirubin 0.2 - 1.2 mg/dL 0.7 0.8 0.4   CBC Latest Ref Rng & Units 02/13/2016 12/10/2015 11/15/2014  WBC 4.0 - 10.5 K/uL 6.4 5.4  6.0  Hemoglobin 13.0 - 17.0 g/dL 14.8 13.5 16.1  Hematocrit 39.0 - 52.0 % 44.6 39.4 47.5  Platelets 150 - 400 K/uL 202 177 212   Lab Results  Component Value Date   MCV 94.7 02/13/2016   MCV 94.0 12/10/2015   MCV 91.3 11/15/2014   Lab Results  Component Value Date   TSH 1.113 12/10/2015  No results found for: HGBA1C   Lipid Panel     Component Value Date/Time   CHOL 145 12/10/2015 1330   TRIG 77 12/10/2015 1330   HDL 45 12/10/2015 1330   CHOLHDL  3.2 12/10/2015 1330   VLDL 15 12/10/2015 1330   LDLCALC 85 12/10/2015 1330    RADIOLOGY: No results found.  IMPRESSION:  1. Paroxysmal atrial fibrillation (HCC)   2. Coronary artery disease involving native coronary artery of native heart without angina pectoris   3. Essential hypertension   4. Bradycardia   5. OSA (obstructive sleep apnea)   6. Alteration in anticoagulation      ASSESSMENT AND PLAN: Nathan Mcclure is a 66 year old gentleman who has mild nonobstructive coronary disease. He developed atrial fibrillation after stopping his metoprolol in 2015.  An echo Doppler study confirmed an ejection fraction of 50-55% with mild-to-moderate mitral regurgitation and mild biatrial enlargement. Remotely he had been on propofenone and metoprolol.since his cardioversion in February 2017.  He has been maintaining sinus rhythm and has been without awareness of recurrent arrhythmia.  He has been utilizing his BiPAP therapy with a BiPAP auto unit for complex sleep apnea .  Since his cardioversion he continues to use BiPAP therapy with 100% compliance. He is bradycardic on exam, but asymptomatic.  His blood pressure is elevated.  He apparently had self reduced his losartan to just 50 mg daily and I have recommended further titration back to 50 mg twice a day.  I had a long discussion with him regarding potential risk with discontinuance of his anticoagulation. Based on his age, history of hypertension, CAD, his cha2ds2vasc score warrants resumption of anticoagulation. He has agreed to reinstitute this therapy.I have provided him samples of Xarelto 20 mg daily.  I will see him in 6 months for reevaluation.  At that time, I have recommended that he undergo a follow-up echo Doppler study.   Time spent: 25 minutes Troy Sine, MD, [facility]  09/10/2017 4:06 PM

## 2017-09-21 NOTE — Progress Notes (Deleted)
Subjective:   Nathan Mcclure was seen in consultation in the movement disorder clinic at the request of Deland Pretty, MD.  The evaluation is for tremor.  Tremor started approximately *** ago and involves the ***.  Tremor is most noticeable when ***.   There is *** family hx of tremor.  Pt has been on amiodarone for a-fib which was started 11/2015 per record review.  Affected by caffeine:  {yes no:314532} Affected by alcohol:  {yes no:314532} Affected by stress:  {yes no:314532} Affected by fatigue:  {yes no:314532} Spills soup if on spoon:  {yes no:314532} Spills glass of liquid if full:  {yes no:314532} Affects ADL's (tying shoes, brushing teeth, etc):  {yes no:314532}  Current/Previously tried tremor medications: ***  Current medications that may exacerbate tremor:  ***  Outside reports reviewed: {Outside review:15817}.  Allergies  Allergen Reactions  . Penicillins     Outpatient Encounter Prescriptions as of 09/23/2017  Medication Sig  . amiodarone (PACERONE) 200 MG tablet Take 1 tablet (200 mg total) by mouth daily.  . celecoxib (CELEBREX) 200 MG capsule Take 1 capsule by mouth daily.  Marland Kitchen doxazosin (CARDURA) 4 MG tablet Take 4 mg by mouth daily.  . fexofenadine (ALLEGRA) 180 MG tablet Take 180 mg by mouth daily.  . Glucos-Chondroit-Hyaluron-MSM (GLUCOSAMINE CHONDROITIN JOINT PO) Take 2 tablets by mouth 2 (two) times daily.  Marland Kitchen losartan (COZAAR) 50 MG tablet Take 1 tablet (50 mg total) by mouth 2 (two) times daily.  Marland Kitchen oxyCODONE (OXY IR/ROXICODONE) 5 MG immediate release tablet Take 1 tablet by mouth as needed.  . predniSONE (DELTASONE) 5 MG tablet Take 5 mg by mouth daily with breakfast.  . rivaroxaban (XARELTO) 20 MG TABS tablet Take 1 tablet (20 mg total) by mouth daily with supper.  . rosuvastatin (CRESTOR) 20 MG tablet Take 1 tablet (20 mg total) by mouth daily.  . sertraline (ZOLOFT) 100 MG tablet 100 mg once.   No facility-administered encounter medications on file  as of 09/23/2017.     Past Medical History:  Diagnosis Date  . Anxiety   . Arthritis   . Coronary artery disease    history of Atrial Fib.  . Depression   . Hyperlipidemia   . Hypertension   . kidney stone   . Low testosterone   . Palpitations     Past Surgical History:  Procedure Laterality Date  . BACK SURGERY  2005  . CARDIAC CATHETERIZATION  05/14/2004   Contiued medical therapy  . CARDIOVASCULAR STRESS TEST  09/19/2009   Observed defect is consistent with diaphragmatic attentuation. EKG negative for ischemia.  Marland Kitchen CARDIOVERSION N/A 11/20/2014   Procedure: CARDIOVERSION;  Surgeon: Troy Sine, MD;  Location: [facility] ENDOSCOPY;  Service: Cardiovascular;  Laterality: N/A;  . CARDIOVERSION N/A 02/19/2016   Procedure: CARDIOVERSION;  Surgeon: Troy Sine, MD;  Location: [place];  Service: Cardiovascular;  Laterality: N/A;  . CERVICAL FUSION  2005  . [place], 2007   stent insertion, stone basket   . TONSILLECTOMY     as a child  . TRANSTHORACIC ECHOCARDIOGRAM  03/29/2008   EF 67%, mild mitral and tricuspid valve regurg.    Social History   Social History  . Marital status: Married    Spouse name: N/A  . Number of children: N/A  . Years of education: N/A   Occupational History  . Not on file.   Social History Main Topics  . Smoking status: Former Smoker    Types:  Cigarettes    Quit date: 01/25/2004  . Smokeless tobacco: Never Used  . Alcohol use 2.0 oz/week    4 Standard drinks or equivalent per week  . Drug use: Unknown  . Sexual activity: Not on file   Other Topics Concern  . Not on file   Social History Narrative  . No narrative on file    Family Status  Relation Status  . Mother Deceased  . Father Deceased    Review of Systems A complete 10 system ROS was obtained and was negative apart from what is mentioned.   Objective:   VITALS:  There were no vitals filed for this visit. Gen:  Appears stated age and in NAD. HEENT:   Normocephalic, atraumatic. The mucous membranes are moist. The superficial temporal arteries are without ropiness or tenderness. Cardiovascular: Regular rate and rhythm. Lungs: Clear to auscultation bilaterally. Neck: There are no carotid bruits noted bilaterally.  NEUROLOGICAL:  Orientation:  The patient is alert and oriented x 3.  Recent and remote memory are intact.  Attention span and concentration are normal.  Able to name objects and repeat without trouble.  Fund of knowledge is appropriate Cranial nerves: There is good facial symmetry. The pupils are equal round and reactive to light bilaterally. Fundoscopic exam reveals clear disc margins bilaterally. Extraocular muscles are intact and visual fields are full to confrontational testing. Speech is fluent and clear. Soft palate rises symmetrically and there is no tongue deviation. Hearing is intact to conversational tone. Tone: Tone is good throughout. Sensation: Sensation is intact to light touch and pinprick throughout (facial, trunk, extremities). Vibration is intact at the bilateral big toe. There is no extinction with double simultaneous stimulation. There is no sensory dermatomal level identified. Coordination:  The patient has no dysdiadichokinesia or dysmetria. Motor: Strength is 5/5 in the bilateral upper and lower extremities.  Shoulder shrug is equal bilaterally.  There is no pronator drift.  There are no fasciculations noted. DTR's: Deep tendon reflexes are 2/4 at the bilateral biceps, triceps, brachioradialis, patella and achilles.  Plantar responses are downgoing bilaterally. Gait and Station: The patient is able to ambulate without difficulty. The patient is able to heel toe walk without any difficulty. The patient is able to ambulate in a tandem fashion. The patient is able to stand in the Romberg position.   MOVEMENT EXAM: Tremor:  There is *** tremor in the UE, noted most significantly with action.  The patient is *** able to  draw Archimedes spirals without significant difficulty.  There is *** tremor at rest.  The patient is *** able to pour water from one glass to another without spilling it.     Assessment/Plan:   1.  Tremor  -I did tell the patient that while he may have essential tremor, because he is also on amiodarone it would be difficult to rule out that as the cause of tremor, or at least a significant source of exacerbation of tremor.  I told the patient that about 1/3 of patients who are on amiodarone will have some degree of tremor.  I did not advise the patient stop or alter the medication in any way, but did tell the patient that if the medication is stopped for any reason, it can take up to 6 months to know if the tremor was from the medication.   CC:  Deland Pretty, MD

## 2017-09-23 ENCOUNTER — Ambulatory Visit: Payer: Self-pay | Admitting: Neurology

## 2017-10-16 DIAGNOSIS — M542 Cervicalgia: Secondary | ICD-10-CM | POA: Diagnosis not present

## 2017-10-16 DIAGNOSIS — M545 Low back pain: Secondary | ICD-10-CM | POA: Diagnosis not present

## 2017-10-16 DIAGNOSIS — G894 Chronic pain syndrome: Secondary | ICD-10-CM | POA: Diagnosis not present

## 2017-10-17 NOTE — Progress Notes (Signed)
Subjective:   Nathan Mcclure was seen in consultation in the movement disorder clinic at the request of Deland Pretty, MD.  The evaluation is for tremor.  Tremor started into childhood.  They are getting worse and are most bothersome over the last 2 months.    Tremor is most noticeable when using the hands.   There is a family hx of tremor in his sister, daughter, maternal grandmother.  Pt has been on amiodarone for a-fib which was started 11/2015 per record review.  Affected by caffeine:  No.  (2 cups per day) Affected by alcohol:  No. (1 per month) Affected by stress:  Yes.   Affected by fatigue:  Yes.   Spills soup if on spoon:  Yes.   Spills glass of liquid if full:  Yes.   Affects ADL's (tying shoes, brushing teeth, etc):  No., except shaving (shaves with blade).  L hand tremor is worse than R.  Nathan Mcclure is R hand dominant  Current/Previously tried tremor medications: n/a  Current medications that may exacerbate tremor:  amiodarone  Outside reports reviewed: office notes, radiology reports and referral letter/letters.  Allergies  Allergen Reactions  . Penicillins     Outpatient Encounter Prescriptions as of 10/20/2017  Medication Sig  . amiodarone (PACERONE) 200 MG tablet Take 1 tablet (200 mg total) by mouth daily.  Marland Kitchen b complex vitamins tablet Take 1 tablet by mouth daily.  . celecoxib (CELEBREX) 200 MG capsule Take 1 capsule by mouth daily.  Marland Kitchen doxazosin (CARDURA) 4 MG tablet Take 4 mg by mouth daily.  . fexofenadine (ALLEGRA) 180 MG tablet Take 180 mg by mouth daily.  . Glucos-Chondroit-Hyaluron-MSM (GLUCOSAMINE CHONDROITIN JOINT PO) Take 2 tablets by mouth 2 (two) times daily.  Marland Kitchen levocetirizine (XYZAL) 2.5 MG/5ML solution Take 2.5 mg by mouth every evening.  Marland Kitchen losartan (COZAAR) 50 MG tablet Take 1 tablet (50 mg total) by mouth 2 (two) times daily.  Marland Kitchen oxyCODONE (OXY IR/ROXICODONE) 5 MG immediate release tablet Take 1 tablet by mouth as needed.  . predniSONE (DELTASONE) 5 MG  tablet Take 5 mg by mouth daily with breakfast.  . rivaroxaban (XARELTO) 20 MG TABS tablet Take 1 tablet (20 mg total) by mouth daily with supper.  . rosuvastatin (CRESTOR) 20 MG tablet Take 1 tablet (20 mg total) by mouth daily.  . sertraline (ZOLOFT) 100 MG tablet 100 mg once.   No facility-administered encounter medications on file as of 10/20/2017.     Past Medical History:  Diagnosis Date  . Anxiety   . Arthritis   . Atrial fibrillation ([place])   . Coronary artery disease    history of Atrial Fib.  . Depression   . Hyperlipidemia   . Hypertension   . kidney stone   . Low testosterone   . Palpitations   . Rheumatoid arthritis [facility])     Past Surgical History:  Procedure Laterality Date  . BACK SURGERY  2005  . CARDIAC CATHETERIZATION  05/14/2004   Contiued medical therapy  . CARDIOVASCULAR STRESS TEST  09/19/2009   Observed defect is consistent with diaphragmatic attentuation. EKG negative for ischemia.  Marland Kitchen CARDIOVERSION N/A 11/20/2014   Procedure: CARDIOVERSION;  Surgeon: Troy Sine, MD;  Location: [facility] ENDOSCOPY;  Service: Cardiovascular;  Laterality: N/A;  . CARDIOVERSION N/A 02/19/2016   Procedure: CARDIOVERSION;  Surgeon: Troy Sine, MD;  Location: [place];  Service: Cardiovascular;  Laterality: N/A;  . CERVICAL FUSION  2005  . [place], 2007  stent insertion, stone basket   . TONSILLECTOMY     as a child  . TRANSTHORACIC ECHOCARDIOGRAM  03/29/2008   EF 67%, mild mitral and tricuspid valve regurg.    Social History   Social History  . Marital status: Married    Spouse name: N/A  . Number of children: N/A  . Years of education: N/A   Occupational History  . 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 Smoking status: Former Smoker    Types: Cigarettes    Quit date: 01/25/2004  . Smokeless tobacco: Never Used  . Alcohol use 2.0 oz/week    4 Standard drinks or equivalent per week     Comment: couple times a month  . Drug  use: No  . Sexual activity: Not on file   Other Topics Concern  . Not on file   Social History Narrative  . No narrative on file    Family Status  Relation Status  . Mother Deceased  . Father Deceased  . Sister Deceased  . Brother Deceased  . Brother Alive  . Sister Alive  . Son Alive  . Daughter Alive    Review of Systems R foot pain for which Nathan Mcclure takes pain med.  A complete 10 system ROS was obtained and was negative apart from what is mentioned.   Objective:   VITALS:   Vitals:   10/20/17 1347  BP: (!) 130/92  Pulse: 70  SpO2: 97%  Weight: 233 lb (105.7 kg)  Height: 6' (1.829 m)   Gen:  Appears stated age and in NAD. HEENT:  Normocephalic, atraumatic. The mucous membranes are moist. The superficial temporal arteries are without ropiness or tenderness. Cardiovascular: Regular rate and rhythm. Lungs: Clear to auscultation bilaterally. Neck: There are no carotid bruits noted bilaterally.  NEUROLOGICAL:  Orientation:  The patient is alert and oriented x 3.  Recent and remote memory are intact.  Attention span and concentration are normal.  Able to name objects and repeat without trouble.  Fund of knowledge is appropriate Cranial nerves: There is good facial symmetry. The pupils are equal round and reactive to light bilaterally. Fundoscopic exam reveals clear disc margins bilaterally. Extraocular muscles are intact and visual fields are full to confrontational testing. Speech is fluent and clear. Soft palate rises symmetrically and there is no tongue deviation. Hearing is intact to conversational tone. Tone: There is minimal to mild rigidity in the left upper extremity.  Tone elsewhere is normal. Sensation: Sensation is intact to light touch and pinprick throughout (facial, trunk, extremities). Vibration is decreased at the bilateral big toe and ankle. There is no extinction with double simultaneous stimulation. There is no sensory dermatomal level  identified. Coordination:  The patient has no dysdiadichokinesia or dysmetria.  There is no decremation with any form of rapid alternating movements, including alternating supination and pronation of the forearm, hand opening and closing, finger taps, heel taps and toe taps. Motor: Strength is 5/5 in the bilateral upper and lower extremities.  Shoulder shrug is equal bilaterally.  There is no pronator drift.  There are no fasciculations noted. DTR's: Deep tendon reflexes are 2-/4 at the bilateral biceps, triceps, brachioradialis, patella and achilles.  Plantar responses are downgoing bilaterally. Gait and Station: The patient is able to ambulate without difficulty. Nathan Mcclure has trouble ambulating in a tandem fashion.  Nathan Mcclure is able to heel/toe walk.  Nathan Mcclure is able to stand in the romberg position.  MOVEMENT EXAM: Tremor:  There is tremor of the outstretched hands that is most significant on the left and is mostly postural.  It is slightly worse with intention.  Nathan Mcclure has trouble with Archimedes spirals bilaterally.  Nathan Mcclure has difficulty pouring water from one glass to another.  There is a mild rest component to the tremor.     Assessment/Plan:   1.  Tremor  -The patient clearly has a very long history of essential tremor.  I do think it is possible, however, that the amiodarone has worsened the tremor. I told the patient that about 1/3 of patients who are on amiodarone will have some degree of tremor and this may be worse for patients who have already had tremor.  I did not advise the patient stop or alter the medication in any way, but did tell the patient that if the medication is stopped for any reason, it can take up to 6 months to know if the tremor was from the medication.  Nathan Mcclure does have mild features of parkinsonism, but certainly does not have Parkinson's disease.  This also may be from the amiodarone.  The patient asked about medication.  Nathan Mcclure is not a candidate for beta-blocker therapy because of history of  bradycardia.  Nathan Mcclure may not be a candidate for primidone because of his Xarelto.  Some cardiologist allow for this and others do not care for the medication.  I told him I would contact his cardiologist and see what Nathan Mcclure says.  I also asked him to follow-up with his cardiologist to see if there is any other option for the amiodarone.  We did talk about the value of a DaTscan and may use this in the future.  Ultimately, I told him I would contact him after the discussion with his cardiologist.  Nathan Mcclure was agreeable.  We did discuss surgical interventions for tremor, but Nathan Mcclure is not interested in this.  Greater than 50% of this 45-minute visit was spent in counseling with the patient.    CC:  Deland Pretty, MD

## 2017-10-20 ENCOUNTER — Encounter: Payer: Self-pay | Admitting: Neurology

## 2017-10-20 ENCOUNTER — Ambulatory Visit (INDEPENDENT_AMBULATORY_CARE_PROVIDER_SITE_OTHER): Payer: Medicare Other | Admitting: Neurology

## 2017-10-20 VITALS — BP 130/92 | HR 70 | Ht 72.0 in | Wt 233.0 lb

## 2017-10-20 DIAGNOSIS — I2510 Atherosclerotic heart disease of native coronary artery without angina pectoris: Secondary | ICD-10-CM | POA: Diagnosis not present

## 2017-10-20 DIAGNOSIS — G251 Drug-induced tremor: Secondary | ICD-10-CM | POA: Diagnosis not present

## 2017-10-20 DIAGNOSIS — G250 Essential tremor: Secondary | ICD-10-CM | POA: Diagnosis not present

## 2017-10-22 ENCOUNTER — Telehealth: Payer: Self-pay | Admitting: Cardiovascular Disease

## 2017-10-22 ENCOUNTER — Telehealth: Payer: Self-pay | Admitting: Neurology

## 2017-10-22 NOTE — Telephone Encounter (Signed)
-----   Message from [place], DO sent at 10/22/2017  8:11 AM EDT ----- I had sent a message to Dr. Claiborne Billings about the patient on Tuesday but haven't heard back.  See note.  Can you call Dr. Evette Georges office and see if okay to take primidone with xarelto?

## 2017-10-22 NOTE — Telephone Encounter (Signed)
New message   Call from [facility] @ Dr Tat office [phone], wants to know if it is safe for patient to take  Primidone. Please call.

## 2017-10-22 NOTE — Telephone Encounter (Signed)
Left message with Dr. Evette Georges office to see if safe to take primidone. Awaiting call back.

## 2017-10-22 NOTE — Telephone Encounter (Signed)
Patient taking amiodarone and Xarelto, both are contra-indicated with Primidone

## 2017-10-23 NOTE — Telephone Encounter (Signed)
[id number] @ Dr Carles Collet  Will try again later

## 2017-10-23 NOTE — Telephone Encounter (Signed)
Michelle with Dr. Claiborne Billings called back and stated he would not recommend Primidone. Dr. Carles Collet Juluis Rainier.

## 2017-10-23 NOTE — Telephone Encounter (Signed)
Jade @ Dr Tat office notified she will call pt and notify

## 2017-10-26 NOTE — Telephone Encounter (Signed)
Okay.  Let pt know that I really don't have much for medication options because he can't take beta blockers either because of low heart rate.  See if he can address amiodarone with cardiology at next visit.  Also, find out if he is regular [employer] or [employer] as we did discuss DaT

## 2017-10-26 NOTE — Telephone Encounter (Signed)
Left message on machine for patient to call back.  He does have Medicare, but secondary is normal [employer]BS.

## 2017-10-26 NOTE — Telephone Encounter (Signed)
Spoke with patient and made him aware. He wants to hold on DAT scan for now. He will call back if he changes his mind.

## 2017-10-28 ENCOUNTER — Other Ambulatory Visit: Payer: Self-pay | Admitting: Orthopaedic Surgery

## 2017-10-28 DIAGNOSIS — M4722 Other spondylosis with radiculopathy, cervical region: Secondary | ICD-10-CM

## 2018-02-09 DIAGNOSIS — H43393 Other vitreous opacities, bilateral: Secondary | ICD-10-CM | POA: Diagnosis not present

## 2018-02-09 DIAGNOSIS — H0014 Chalazion left upper eyelid: Secondary | ICD-10-CM | POA: Diagnosis not present

## 2018-02-09 DIAGNOSIS — H2513 Age-related nuclear cataract, bilateral: Secondary | ICD-10-CM | POA: Diagnosis not present

## 2018-03-05 DIAGNOSIS — L905 Scar conditions and fibrosis of skin: Secondary | ICD-10-CM | POA: Diagnosis not present

## 2018-03-05 DIAGNOSIS — H0011 Chalazion right upper eyelid: Secondary | ICD-10-CM | POA: Diagnosis not present

## 2018-03-05 DIAGNOSIS — L089 Local infection of the skin and subcutaneous tissue, unspecified: Secondary | ICD-10-CM | POA: Diagnosis not present

## 2018-03-08 ENCOUNTER — Other Ambulatory Visit: Payer: Self-pay | Admitting: Cardiovascular Disease

## 2018-04-01 ENCOUNTER — Encounter (HOSPITAL_COMMUNITY): Payer: Self-pay | Admitting: Radiology

## 2018-04-06 ENCOUNTER — Other Ambulatory Visit: Payer: Self-pay | Admitting: Cardiovascular Disease

## 2018-04-07 DIAGNOSIS — M4722 Other spondylosis with radiculopathy, cervical region: Secondary | ICD-10-CM | POA: Diagnosis not present

## 2018-04-07 DIAGNOSIS — G894 Chronic pain syndrome: Secondary | ICD-10-CM | POA: Diagnosis not present

## 2018-04-15 ENCOUNTER — Telehealth (HOSPITAL_COMMUNITY): Payer: Self-pay | Admitting: Cardiovascular Disease

## 2018-04-15 NOTE — Telephone Encounter (Signed)
03/16/18 LMOM to schedule echo EVD 03/24/18 LMOM to schedule echo EVD 03/29/18 (Straight to VM)Lmsg for him to CB to sch echo..RG 04/01/18 Will mail letter EVD 04/06/18 lmom to schedule echo NJM 04-12-18  LMOM to schedule echo. ST

## 2018-05-11 ENCOUNTER — Other Ambulatory Visit: Payer: Self-pay | Admitting: Cardiovascular Disease

## 2018-06-17 ENCOUNTER — Other Ambulatory Visit: Payer: Self-pay | Admitting: Cardiovascular Disease

## 2018-06-21 ENCOUNTER — Other Ambulatory Visit: Payer: Self-pay | Admitting: *Deleted

## 2018-06-23 ENCOUNTER — Other Ambulatory Visit: Payer: Self-pay

## 2018-07-09 DIAGNOSIS — M25562 Pain in left knee: Secondary | ICD-10-CM | POA: Diagnosis not present

## 2018-07-09 DIAGNOSIS — Z79899 Other long term (current) drug therapy: Secondary | ICD-10-CM | POA: Diagnosis not present

## 2018-07-09 DIAGNOSIS — M5416 Radiculopathy, lumbar region: Secondary | ICD-10-CM | POA: Diagnosis not present

## 2018-07-09 DIAGNOSIS — G894 Chronic pain syndrome: Secondary | ICD-10-CM | POA: Diagnosis not present

## 2018-07-09 DIAGNOSIS — M545 Low back pain: Secondary | ICD-10-CM | POA: Diagnosis not present

## 2018-07-09 DIAGNOSIS — M1712 Unilateral primary osteoarthritis, left knee: Secondary | ICD-10-CM | POA: Diagnosis not present

## 2018-07-16 ENCOUNTER — Other Ambulatory Visit: Payer: Self-pay | Admitting: Orthopaedic Surgery

## 2018-07-16 DIAGNOSIS — M5416 Radiculopathy, lumbar region: Secondary | ICD-10-CM

## 2018-07-23 ENCOUNTER — Other Ambulatory Visit: Payer: Self-pay | Admitting: Cardiovascular Disease

## 2018-07-26 DIAGNOSIS — M0609 Rheumatoid arthritis without rheumatoid factor, multiple sites: Secondary | ICD-10-CM | POA: Diagnosis not present

## 2018-08-03 DIAGNOSIS — M545 Low back pain: Secondary | ICD-10-CM | POA: Diagnosis not present

## 2018-08-03 DIAGNOSIS — M1712 Unilateral primary osteoarthritis, left knee: Secondary | ICD-10-CM | POA: Diagnosis not present

## 2018-08-03 DIAGNOSIS — Z6827 Body mass index (BMI) 27.0-27.9, adult: Secondary | ICD-10-CM | POA: Diagnosis not present

## 2018-08-03 DIAGNOSIS — G894 Chronic pain syndrome: Secondary | ICD-10-CM | POA: Diagnosis not present

## 2018-08-12 DIAGNOSIS — M1712 Unilateral primary osteoarthritis, left knee: Secondary | ICD-10-CM | POA: Diagnosis not present

## 2018-08-25 ENCOUNTER — Other Ambulatory Visit: Payer: Self-pay | Admitting: Cardiovascular Disease

## 2018-10-07 DIAGNOSIS — J019 Acute sinusitis, unspecified: Secondary | ICD-10-CM | POA: Diagnosis not present

## 2018-10-14 ENCOUNTER — Other Ambulatory Visit: Payer: Self-pay | Admitting: Cardiovascular Disease

## 2018-10-18 DIAGNOSIS — M2550 Pain in unspecified joint: Secondary | ICD-10-CM | POA: Diagnosis not present

## 2018-10-18 DIAGNOSIS — M059 Rheumatoid arthritis with rheumatoid factor, unspecified: Secondary | ICD-10-CM | POA: Diagnosis not present

## 2018-10-18 DIAGNOSIS — M150 Primary generalized (osteo)arthritis: Secondary | ICD-10-CM | POA: Diagnosis not present

## 2018-10-18 DIAGNOSIS — Z79899 Other long term (current) drug therapy: Secondary | ICD-10-CM | POA: Diagnosis not present

## 2018-10-27 ENCOUNTER — Ambulatory Visit: Payer: Medicare Other | Admitting: Physician Assistant

## 2018-10-28 ENCOUNTER — Encounter (INDEPENDENT_AMBULATORY_CARE_PROVIDER_SITE_OTHER): Payer: BLUE CROSS/BLUE SHIELD | Admitting: Podiatry

## 2018-10-28 ENCOUNTER — Ambulatory Visit: Payer: BLUE CROSS/BLUE SHIELD

## 2018-10-28 DIAGNOSIS — M7750 Other enthesopathy of unspecified foot: Secondary | ICD-10-CM

## 2018-10-28 NOTE — Progress Notes (Signed)
This encounter was created in error - please disregard.

## 2018-10-31 ENCOUNTER — Other Ambulatory Visit: Payer: Self-pay | Admitting: Cardiovascular Disease

## 2018-11-08 DIAGNOSIS — E291 Testicular hypofunction: Secondary | ICD-10-CM | POA: Diagnosis not present

## 2018-11-08 DIAGNOSIS — N403 Nodular prostate with lower urinary tract symptoms: Secondary | ICD-10-CM | POA: Diagnosis not present

## 2018-11-08 DIAGNOSIS — R3912 Poor urinary stream: Secondary | ICD-10-CM | POA: Diagnosis not present

## 2018-11-08 DIAGNOSIS — N5201 Erectile dysfunction due to arterial insufficiency: Secondary | ICD-10-CM | POA: Diagnosis not present

## 2018-11-08 DIAGNOSIS — R972 Elevated prostate specific antigen [PSA]: Secondary | ICD-10-CM | POA: Diagnosis not present

## 2018-11-08 DIAGNOSIS — R3121 Asymptomatic microscopic hematuria: Secondary | ICD-10-CM | POA: Diagnosis not present

## 2018-11-09 ENCOUNTER — Telehealth: Payer: Self-pay

## 2018-11-09 ENCOUNTER — Telehealth: Payer: Self-pay | Admitting: Cardiovascular Disease

## 2018-11-09 NOTE — Telephone Encounter (Signed)
Received records from [facility] on 11/09/18, Appt 11/10/18 @ 9:30AM.NV

## 2018-11-09 NOTE — Telephone Encounter (Signed)
   [facility] Pre-operative Risk Assessment    Request for surgical clearance:  1. What type of surgery is being performed? Prostate biopsy  2. When is this surgery scheduled? TBD  3. What type of clearance is required (medical clearance vs. Pharmacy clearance to hold med vs. Both)? Both  4. Are there any medications that need to be held prior to surgery and how long? Hold Xarelto 5 days prior to surgery  5. Practice name and name of physician performing surgery? [facility]  Dr.Wrenn   6. What is your office phone number [phone]   7.   What is your office fax number [phone]  8.   Anesthesia type Not listed   Kathyrn Lass 11/09/2018, 5:40 PM  _________________________________________________________________   (provider comments below)

## 2018-11-10 ENCOUNTER — Ambulatory Visit: Payer: Self-pay | Admitting: Physician Assistant

## 2018-11-10 NOTE — Telephone Encounter (Signed)
Called Dr.Wrenn's office, notified that we needed to make an appointment first and that I was unable to do so, but would let them know when he was scheduled.  Left message on Pam (Surgical Coordinator) line and advised of message above.  Left call back number if questions.

## 2018-11-10 NOTE — Telephone Encounter (Signed)
Patient needing clearance for prostate biopsy, date TBD.  Office is trying to contact patient as he needs to be seen for clearance.  He will also need BMET and CBC, as last readings for either of these is 04/2017.  Also his last 2 OV (6 weeks apart) had weight differences of over 7 kg.  Will need updated information to verify CrCl.  Once we have updated labs, will determine time to hold Xarelto

## 2018-11-10 NOTE — Telephone Encounter (Signed)
   Primary Cardiologist:Dr Claiborne Billings  Chart reviewed as part of pre-operative protocol coverage. Because of Nathan Mcclure's past medical history and time since last visit, he/she will require a follow-up visit in order to better assess preoperative cardiovascular risk.  Pre-op covering staff: - Please schedule appointment and call patient to inform them. - Please contact requesting surgeon's office via preferred method (i.e, phone, fax) to inform them of need for appointment prior to surgery.  If applicable, this message will also be routed to pharmacy pool and/or primary cardiologist for input on holding anticoagulant/antiplatelet agent as requested below so that this information is available at time of patient's appointment.   Kerin Ransom, PA-C  11/10/2018, 4:23 PM

## 2018-11-10 NOTE — Progress Notes (Deleted)
Cardiology Office Note    Date:  11/10/2018   ID:  Nathan Mcclure, DOB 01/31/1951, MRN [id number]  PCP:  Deland Pretty, MD  Cardiologist: Dr. Claiborne Billings  No chief complaint on file.   History of Present Illness:  Nathan Mcclure is a 67 y.o. male with PMH of mild nonobstructive CAD on cath 2000 and 2005, hyperlipidemia, hypertension, history of kidney stone, rheumatoid arthritis, anxiety, OSA on BiPAP and history of PAF.  Myoview in September 2010 was low risk without significant ischemia.  Last echocardiogram obtained on 02/23/2014 showed EF 50 to 55%, mild LAE, mild to moderate MR.  Patient is on Xarelto and amiodarone for atrial fibrillation.  He had a DC cardioversion in November 2015.  Yes EKG  Past Medical History:  Diagnosis Date  . Anxiety   . Arthritis   . Atrial fibrillation ([place])   . Coronary artery disease    history of Atrial Fib.  . Depression   . Hyperlipidemia   . Hypertension   . kidney stone   . Low testosterone   . Palpitations   . Rheumatoid arthritis [facility])     Past Surgical History:  Procedure Laterality Date  . BACK SURGERY  2005  . CARDIAC CATHETERIZATION  05/14/2004   Contiued medical therapy  . CARDIOVASCULAR STRESS TEST  09/19/2009   Observed defect is consistent with diaphragmatic attentuation. EKG negative for ischemia.  Marland Kitchen CARDIOVERSION N/A 11/20/2014   Procedure: CARDIOVERSION;  Surgeon: Troy Sine, MD;  Location: [facility] ENDOSCOPY;  Service: Cardiovascular;  Laterality: N/A;  . CARDIOVERSION N/A 02/19/2016   Procedure: CARDIOVERSION;  Surgeon: Troy Sine, MD;  Location: [place];  Service: Cardiovascular;  Laterality: N/A;  . CERVICAL FUSION  2005  . [place], 2007   stent insertion, stone basket   . TONSILLECTOMY     as a child  . TRANSTHORACIC ECHOCARDIOGRAM  03/29/2008   EF 67%, mild mitral and tricuspid valve regurg.    Current Medications: Outpatient Medications Prior to Visit  Medication Sig Dispense Refill    . amiodarone (PACERONE) 200 MG tablet TAKE 1 TABLET BY MOUTH DAILY 90 tablet 2  . b complex vitamins tablet Take 1 tablet by mouth daily.    . celecoxib (CELEBREX) 200 MG capsule Take 1 capsule by mouth daily.    Marland Kitchen doxazosin (CARDURA) 4 MG tablet Take 4 mg by mouth daily.    . fexofenadine (ALLEGRA) 180 MG tablet Take 180 mg by mouth daily.    . Glucos-Chondroit-Hyaluron-MSM (GLUCOSAMINE CHONDROITIN JOINT PO) Take 2 tablets by mouth 2 (two) times daily.    Marland Kitchen levocetirizine (XYZAL) 2.5 MG/5ML solution Take 2.5 mg by mouth every evening.    Marland Kitchen losartan (COZAAR) 50 MG tablet Take 1 tablet (50 mg total) by mouth 2 (two) times daily. 180 tablet 3  . oxyCODONE (OXY IR/ROXICODONE) 5 MG immediate release tablet Take 1 tablet by mouth as needed.    . predniSONE (DELTASONE) 5 MG tablet Take 5 mg by mouth daily with breakfast.    . rivaroxaban (XARELTO) 20 MG TABS tablet Take 1 tablet (20 mg total) by mouth daily with supper. 30 tablet 0  . rosuvastatin (CRESTOR) 20 MG tablet Take 1 tablet (20 mg total) by mouth daily. 90 tablet 3  . sertraline (ZOLOFT) 100 MG tablet 100 mg once.     No facility-administered medications prior to visit.      Allergies:   Penicillins   Social History   Socioeconomic History  .  Marital status: Married    Spouse name: Not on file  . Number of children: Not on file  . Years of education: Not on file  . Highest education level: Not on file  Occupational History  . Occupation: retired    Comment: fed ex  Social Needs  . Financial resource strain: Not on file  . Food insecurity:    Worry: Not on file    Inability: Not on file  . Transportation needs:    Medical: Not on file    Non-medical: Not on file  Tobacco Use  . Smoking status: Former Smoker    Types: Cigarettes    Last attempt to quit: 01/25/2004    Years since quitting: 14.8  . Smokeless tobacco: Never Used  Substance and Sexual Activity  . Alcohol use: Yes    Alcohol/week: 4.0 standard drinks     Types: 4 Standard drinks or equivalent per week    Comment: couple times a month  . Drug use: No  . Sexual activity: Not on file  Lifestyle  . Physical activity:    Days per week: Not on file    Minutes per session: Not on file  . Stress: Not on file  Relationships  . Social connections:    Talks on phone: Not on file    Gets together: Not on file    Attends religious service: Not on file    Active member of club or organization: Not on file    Attends meetings of clubs or organizations: Not on file    Relationship status: Not on file  Other Topics Concern  . Not on file  Social History Narrative  . Not on file     Family History:  The patient's ***family history includes Alopecia in his daughter; Alzheimer's disease in his father; Brain cancer in his sister; Diabetes in his mother; Heart disease in his mother and sister; Kidney failure in his mother; Lung cancer in his brother; Prostate cancer in his father; Tremor in his daughter.   ROS:   Please see the history of present illness.    ROS All other systems reviewed and are negative.   PHYSICAL EXAM:   VS:  There were no vitals taken for this visit.   GEN: Well nourished, well developed, in no acute distress HEENT: normal Neck: no JVD, carotid bruits, or masses Cardiac: ***RRR; no murmurs, rubs, or gallops,no edema  Respiratory:  clear to auscultation bilaterally, normal work of breathing GI: soft, nontender, nondistended, + BS MS: no deformity or atrophy Skin: warm and dry, no rash Neuro:  Alert and Oriented x 3, Strength and sensation are intact Psych: euthymic mood, full affect  Wt Readings from Last 3 Encounters:  10/20/17 233 lb (105.7 kg)  09/08/17 218 lb (98.9 kg)  12/08/16 220 lb 6.4 oz (100 kg)      Studies/Labs Reviewed:   EKG:  EKG is*** ordered today.  The ekg ordered today demonstrates ***  Recent Labs: No results found for requested labs within last 8760 hours.   Lipid Panel    Component Value  Date/Time   CHOL 145 12/10/2015 1330   TRIG 77 12/10/2015 1330   HDL 45 12/10/2015 1330   CHOLHDL 3.2 12/10/2015 1330   VLDL 15 12/10/2015 1330   LDLCALC 85 12/10/2015 1330    Additional studies/ records that were reviewed today include:   Echo 02/23/2014 LV EF: 50% -  55% Study Conclusions  - Left ventricle: The cavity size was normal.  Wall thickness was increased in a pattern of mild LVH. Systolic function was normal. The estimated ejection fraction was in the range of 50% to 55%. - Mitral valve: Mild to moderate regurgitation. - Left atrium: The atrium was mildly dilated. - Right atrium: The atrium was mildly dilated.    ASSESSMENT:    No diagnosis found.   PLAN:  In order of problems listed above:  1. ***    Medication Adjustments/Labs and Tests Ordered: Current medicines are reviewed at length with the patient today.  Concerns regarding medicines are outlined above.  Medication changes, Labs and Tests ordered today are listed in the Patient Instructions below. There are no Patient Instructions on file for this visit.   Hilbert Corrigan, [place]  11/10/2018 9:35 AM    [address], [place], [place]  [address] Phone: [phone]; Fax: [phone]

## 2018-11-10 NOTE — Telephone Encounter (Signed)
Attempted to contact number on file.  Went to voicemail, left message advising patient we needed to see him for his surgical clearance and to call back to schedule.   Left call back number.

## 2018-11-11 ENCOUNTER — Encounter: Payer: Self-pay | Admitting: Physician Assistant

## 2018-11-11 NOTE — Telephone Encounter (Signed)
Attempted to contact patient again regarding appointment for Surgical Clearance.  Left call back number.

## 2018-11-12 ENCOUNTER — Telehealth: Payer: Self-pay

## 2018-11-12 NOTE — Telephone Encounter (Signed)
This is a duplicate request - see request 11/19. PharmD input as below:  Rockne Menghini, RPH-CPP 5:20 PM  Note    Patient needing clearance for prostate biopsy, date TBD.  Office is trying to contact patient as he needs to be seen for clearance.  He will also need BMET and CBC, as last readings for either of these is 04/2017.  Also his last 2 OV (6 weeks apart) had weight differences of over 7 kg.  Will need updated information to verify CrCl.  Once we have updated labs, will determine time to hold Xarelto

## 2018-11-12 NOTE — Telephone Encounter (Signed)
Pt is scheduled to see Men Hao PA-C on 11/30/18, I assured pt clearance will be addressed at visit .

## 2018-11-12 NOTE — Telephone Encounter (Signed)
Message sent to [facility]

## 2018-11-12 NOTE — Telephone Encounter (Signed)
   [facility] Pre-operative Risk Assessment    Request for surgical clearance:  1. What type of surgery is being performed? Prostate biopsy   2. When is this surgery scheduled? TBD  3. What type of clearance is required (medical clearance vs. Pharmacy clearance to hold med vs. Both)? Pharmacy  4. Are there any medications that need to be held prior to surgery and how long? Hold Xarelto 72 hours prior to biopsy   5. Practice name and name of physician performing surgery? [facility] Dr.Wrenn  6. What is your office phone number [phone]   7.   What is your office fax number [phone]  8.   Anesthesia type No anesthesia    Kathyrn Lass 11/12/2018, 1:58 PM  _________________________________________________________________   (provider comments below)

## 2018-11-25 ENCOUNTER — Telehealth: Payer: Self-pay | Admitting: Physician Assistant

## 2018-11-25 NOTE — Telephone Encounter (Signed)
Received clearance from [facility], [facility] on 11/25/18, Appt 11/30/18 @ 3:00PM. NV

## 2018-11-30 ENCOUNTER — Ambulatory Visit: Payer: Self-pay | Admitting: Physician Assistant

## 2018-12-03 DIAGNOSIS — M25562 Pain in left knee: Secondary | ICD-10-CM | POA: Diagnosis not present

## 2018-12-09 DIAGNOSIS — M25522 Pain in left elbow: Secondary | ICD-10-CM | POA: Diagnosis not present

## 2018-12-09 DIAGNOSIS — M25512 Pain in left shoulder: Secondary | ICD-10-CM | POA: Diagnosis not present

## 2018-12-16 ENCOUNTER — Ambulatory Visit: Payer: BLUE CROSS/BLUE SHIELD | Admitting: Physician Assistant

## 2018-12-16 ENCOUNTER — Encounter: Payer: Self-pay | Admitting: Physician Assistant

## 2018-12-16 VITALS — BP 148/92 | HR 79 | Ht 73.0 in | Wt 232.2 lb

## 2018-12-16 DIAGNOSIS — M069 Rheumatoid arthritis, unspecified: Secondary | ICD-10-CM

## 2018-12-16 DIAGNOSIS — I480 Paroxysmal atrial fibrillation: Secondary | ICD-10-CM | POA: Diagnosis not present

## 2018-12-16 DIAGNOSIS — Z01810 Encounter for preprocedural cardiovascular examination: Secondary | ICD-10-CM

## 2018-12-16 DIAGNOSIS — E785 Hyperlipidemia, unspecified: Secondary | ICD-10-CM | POA: Diagnosis not present

## 2018-12-16 DIAGNOSIS — I10 Essential (primary) hypertension: Secondary | ICD-10-CM | POA: Diagnosis not present

## 2018-12-16 DIAGNOSIS — I2510 Atherosclerotic heart disease of native coronary artery without angina pectoris: Secondary | ICD-10-CM

## 2018-12-16 MED ORDER — AMIODARONE HCL 200 MG PO TABS: 100.0000 mg | ORAL_TABLET | Freq: Every day | ORAL | 0 refills | Status: DC

## 2018-12-16 MED ORDER — RIVAROXABAN 20 MG PO TABS: 20.0000 mg | ORAL_TABLET | Freq: Every day | ORAL | 3 refills | Status: DC

## 2018-12-16 NOTE — Patient Instructions (Signed)
Medication Instructions:  Decrease Amiodarone to 100 mg daily (cut 200 mg in half) Xarelto refill sent to pharmacy.  If you need a refill on your cardiac medications before your next appointment, please call your pharmacy.    Follow-Up: At [facility] you and your health needs are our priority.  As part of our continuing mission to provide you with exceptional heart care, we have created designated Provider Care Teams.  These Care Teams include your primary Cardiologist (physician) and Advanced Practice Providers (APPs -  Physician Assistants and Nurse Practitioners) who all work together to provide you with the care you need, when you need it. You will need a follow up appointment in 12 months.  Please call our office 2 months in advance to schedule this appointment.  You may see Dr.Kelly or one of the following Advanced Practice Providers on your designated Care Team: Almyra Deforest, [place] . Fabian Sharp, PA-C  Any Other Special Instructions Will Be Listed Below (If Applicable). Surgical Clearance to be sent. Hold Xarelto for 3 days prior to procedure.

## 2018-12-16 NOTE — Progress Notes (Signed)
Cardiology Office Note    Date:  12/16/2018   ID:  Nathan Mcclure, DOB January 14, 1951, MRN [id number]  PCP:  Deland Pretty, MD  Cardiologist:  Dr. Claiborne Billings  Chief Complaint  Patient presents with  . Follow-up    seen for Dr. Claiborne Billings.   . Pre-op Exam    prior to prostate biopsy by Dr. Jeffie Pollock of [facility]    History of Present Illness:  Nathan Mcclure is a 67 y.o. male with PMH of atrial fibrillation, hypertension, hyperlipidemia, rheumatoid arthritis and history of CAD.  He was noted to have mild CAD on cardiac catheterization in 2000.  Cardiac catheterization in May 2005 showed mild nonobstructive CAD involving LAD, intermediate and RCA which was treated medically.  He had new atrial fibrillation in 2015 after coming off of his beta-blocker.  He was started on Xarelto and metoprolol.  Echocardiogram obtained in March 2015 showed EF 50 to 55%, mild to moderate MR. he was treated with propafenone, he was unable to tolerate a higher dose of this medication due to nausea.  This was later switched to amiodarone.  He had a sleep study that confirmed a moderate obstructive sleep apnea.  His O2 saturation dropped down to 87% during sleep.  He was started on CPAP trial but later found to have central apneic events which ultimately switched to BiPAP therapy.  He had a cardioversion in November 2015 and February 2017.  Patient presents today for preoperative clearance prior to his prostate biopsy.  His recent PSA was reportedly to be 4.8.  He denies any recent chest discomfort, shortness of breath.  He has been able to climb up 2 flight of stairs without any issues and he is able to walk his dog for 2-4 blocks away from his house and back without any chest discomfort or shortness of breath.  Overall, he is doing very well from her perspective.  Since he is able to complete at least 4 METS of activity without issue, he is cleared to proceed with the prostate biopsy.  I have discussed his risk factors  with our clinical pharmacist, based on our office protocol, he is okay to hold the Xarelto for 3 days prior to the surgery.  He can restart the Xarelto after the surgery as soon as possible.  His lipid panel is being followed by his primary care provider and his rheumatologist.  He can follow-up with Dr. Claiborne Billings in 1 year.  Note, his blood pressure is borderline high today, however he has not taken his blood pressure medication this morning.  Overall, he is concerned about some fatigue that has occurred ever since he was started on amiodarone, I will decrease amiodarone to 100 mg daily to see if this will improve his symptoms.   Past Medical History:  Diagnosis Date  . Anxiety   . Arthritis   . Atrial fibrillation ([place])   . Coronary artery disease    history of Atrial Fib.  . Depression   . Hyperlipidemia   . Hypertension   . kidney stone   . Low testosterone   . Palpitations   . Rheumatoid arthritis [facility])     Past Surgical History:  Procedure Laterality Date  . BACK SURGERY  2005  . CARDIAC CATHETERIZATION  05/14/2004   Contiued medical therapy  . CARDIOVASCULAR STRESS TEST  09/19/2009   Observed defect is consistent with diaphragmatic attentuation. EKG negative for ischemia.  Marland Kitchen CARDIOVERSION N/A 11/20/2014   Procedure: CARDIOVERSION;  Surgeon: Joyice Faster  Claiborne Billings, MD;  Location: [facility]COPY;  Service: Cardiovascular;  Laterality: N/A;  . CARDIOVERSION N/A 02/19/2016   Procedure: CARDIOVERSION;  Surgeon: Troy Sine, MD;  Location: [place];  Service: Cardiovascular;  Laterality: N/A;  . CERVICAL FUSION  2005  . [place], 2007   stent insertion, stone basket   . TONSILLECTOMY     as a child  . TRANSTHORACIC ECHOCARDIOGRAM  03/29/2008   EF 67%, mild mitral and tricuspid valve regurg.    Current Medications: Outpatient Medications Prior to Visit  Medication Sig Dispense Refill  . b complex vitamins tablet Take 1 tablet by mouth daily.    . celecoxib (CELEBREX)  200 MG capsule Take 1 capsule by mouth daily.    Marland Kitchen doxazosin (CARDURA) 4 MG tablet Take 4 mg by mouth daily.    [name] SURECLICK 50 MG/ML injection     . fexofenadine (ALLEGRA) 180 MG tablet Take 180 mg by mouth daily.    . Glucos-Chondroit-Hyaluron-MSM (GLUCOSAMINE CHONDROITIN JOINT PO) Take 2 tablets by mouth 2 (two) times daily.    Marland Kitchen losartan (COZAAR) 50 MG tablet Take 1 tablet (50 mg total) by mouth 2 (two) times daily. 180 tablet 3  . oxyCODONE (OXY IR/ROXICODONE) 5 MG immediate release tablet Take 1 tablet by mouth as needed.    . predniSONE (DELTASONE) 5 MG tablet Take 5 mg by mouth daily with breakfast.    . rosuvastatin (CRESTOR) 20 MG tablet Take 1 tablet (20 mg total) by mouth daily. 90 tablet 3  . sertraline (ZOLOFT) 100 MG tablet 100 mg once.    . tadalafil (CIALIS) 5 MG tablet Take 5 mg by mouth daily as needed for erectile dysfunction.    Marland Kitchen amiodarone (PACERONE) 200 MG tablet TAKE 1 TABLET BY MOUTH DAILY 90 tablet 2  . rivaroxaban (XARELTO) 20 MG TABS tablet Take 1 tablet (20 mg total) by mouth daily with supper. 30 tablet 0  . levocetirizine (XYZAL) 2.5 MG/5ML solution Take 2.5 mg by mouth every evening.     No facility-administered medications prior to visit.      Allergies:   Penicillins   Social History   Socioeconomic History  . Marital status: Married    Spouse name: Not on file  . Number of children: Not on file  . Years of education: Not on file  . Highest education level: Not on file  Occupational History  . Occupation: retired    Comment: [employer]  Social Needs  . Financial resource strain: Not on file  . Food insecurity:    Worry: Not on file    Inability: Not on file  . Transportation needs:    Medical: Not on file    Non-medical: Not on file  Tobacco Use  . Smoking status: Former Smoker    Types: Cigarettes    Last attempt to quit: 01/25/2004    Years since quitting: 14.9  . Smokeless tobacco: Never Used  Substance and Sexual Activity  . Alcohol  use: Yes    Alcohol/week: 4.0 standard drinks    Types: 4 Standard drinks or equivalent per week    Comment: couple times a month  . Drug use: No  . Sexual activity: Not on file  Lifestyle  . Physical activity:    Days per week: Not on file    Minutes per session: Not on file  . Stress: Not on file  Relationships  . Social connections:    Talks on phone: Not on  file    Gets together: Not on file    Attends religious service: Not on file    Active member of club or organization: Not on file    Attends meetings of clubs or organizations: Not on file    Relationship status: Not on file  Other Topics Concern  . Not on file  Social History Narrative  . Not on file     Family History:  The patient's  family history includes Alopecia in his daughter; Alzheimer's disease in his father; Brain cancer in his sister; Diabetes in his mother; Heart disease in his mother and sister; Kidney failure in his mother; Lung cancer in his brother; Prostate cancer in his father; Tremor in his daughter.   ROS:   Please see the history of present illness.    ROS All other systems reviewed and are negative.   PHYSICAL EXAM:   VS:  BP (!) 148/92   Pulse 79   Ht 6\' 1"  (1.854 m)   Wt 232 lb 3.2 oz (105.3 kg)   BMI 30.64 kg/m    GEN: Well nourished, well developed, in no acute distress  HEENT: normal  Neck: no JVD, carotid bruits, or masses Cardiac: RRR; no murmurs, rubs, or gallops,no edema  Respiratory:  clear to auscultation bilaterally, normal work of breathing GI: soft, nontender, nondistended, + BS MS: no deformity or atrophy  Skin: warm and dry, no rash Neuro:  Alert and Oriented x 3, Strength and sensation are intact Psych: euthymic mood, full affect  Wt Readings from Last 3 Encounters:  12/16/18 232 lb 3.2 oz (105.3 kg)  10/20/17 233 lb (105.7 kg)  09/08/17 218 lb (98.9 kg)      Studies/Labs Reviewed:   EKG:  EKG is ordered today.  The ekg ordered today demonstrates NSR with TWI  in inferior leads.  Recent Labs: No results found for requested labs within last 8760 hours.   Lipid Panel    Component Value Date/Time   CHOL 145 12/10/2015 1330   TRIG 77 12/10/2015 1330   HDL 45 12/10/2015 1330   CHOLHDL 3.2 12/10/2015 1330   VLDL 15 12/10/2015 1330   LDLCALC 85 12/10/2015 1330    Additional studies/ records that were reviewed today include:   Echo 02/23/2014 LV EF: 50% -  55% Study Conclusions  - Left ventricle: The cavity size was normal. Wall thickness was increased in a pattern of mild LVH. Systolic function was normal. The estimated ejection fraction was in the range of 50% to 55%. - Mitral valve: Mild to moderate regurgitation. - Left atrium: The atrium was mildly dilated. - Right atrium: The atrium was mildly dilated.   ASSESSMENT:    1. Preoperative cardiovascular examination   2. Paroxysmal atrial fibrillation (HCC)   3. Essential hypertension   4. Hyperlipidemia LDL goal <70   5. Rheumatoid arthritis, involving unspecified site, unspecified rheumatoid factor presence ([place])   6. Coronary artery disease involving native coronary artery of native heart without angina pectoris      PLAN:  In order of problems listed above:  1. Preoperative clearance: Upcoming prostate biopsy by Dr. Jeffie Pollock of [facility].  Patient is able to complete at least 4 METs of activity without any chest discomfort or shortness of breath, he is cleared to proceed with the biopsy after holding Xarelto for 3 days.  He will need to restart Xarelto after the procedure as soon as possible at the discretion of the surgeon  2. CAD: History of mild  CAD on previous cardiac catheterization, patient has not had any exertional symptom recently.  3. Hypertension: Blood pressure elevated today, however patient did not take his blood pressure medication prior to today's visit  4. Hyperlipidemia: On Crestor 20 mg daily.  5. Paroxysmal atrial fibrillation: On Xarelto, will  need to hold Xarelto for 3 days prior to the procedure and restart as soon as possible afterward.  He has been having some fatigue ever since started on amiodarone, will reduce amiodarone to 100 mg daily.  He is not on any AV nodal blocking agent due to history of bradycardia.    Medication Adjustments/Labs and Tests Ordered: Current medicines are reviewed at length with the patient today.  Concerns regarding medicines are outlined above.  Medication changes, Labs and Tests ordered today are listed in the Patient Instructions below. Patient Instructions  Medication Instructions:  Decrease Amiodarone to 100 mg daily (cut 200 mg in half) Xarelto refill sent to pharmacy.  If you need a refill on your cardiac medications before your next appointment, please call your pharmacy.    Follow-Up: At [facility], you and your health needs are our priority.  As part of our continuing mission to provide you with exceptional heart care, we have created designated Provider Care Teams.  These Care Teams include your primary Cardiologist (physician) and Advanced Practice Providers (APPs -  Physician Assistants and Nurse Practitioners) who all work together to provide you with the care you need, when you need it. You will need a follow up appointment in 12 months.  Please call our office 2 months in advance to schedule this appointment.  You may see Dr.Kelly or one of the following Advanced Practice Providers on your designated Care Team: Almyra Deforest, [place] . Fabian Sharp, PA-C  Any Other Special Instructions Will Be Listed Below (If Applicable). Surgical Clearance to be sent. Hold Xarelto for 3 days prior to procedure.       Hilbert Corrigan, [place]  12/16/2018 1:26 PM    [facility] [place], [place], [place]  [address] Phone: [phone]; Fax: [phone]

## 2018-12-21 DIAGNOSIS — M545 Low back pain: Secondary | ICD-10-CM | POA: Diagnosis not present

## 2018-12-21 DIAGNOSIS — G894 Chronic pain syndrome: Secondary | ICD-10-CM | POA: Diagnosis not present

## 2019-01-12 DIAGNOSIS — M25511 Pain in right shoulder: Secondary | ICD-10-CM | POA: Diagnosis not present

## 2019-01-12 DIAGNOSIS — M7551 Bursitis of right shoulder: Secondary | ICD-10-CM | POA: Diagnosis not present

## 2019-01-12 DIAGNOSIS — M542 Cervicalgia: Secondary | ICD-10-CM | POA: Diagnosis not present

## 2019-01-12 DIAGNOSIS — I10 Essential (primary) hypertension: Secondary | ICD-10-CM | POA: Diagnosis not present

## 2019-01-12 DIAGNOSIS — G894 Chronic pain syndrome: Secondary | ICD-10-CM | POA: Diagnosis not present

## 2019-01-12 DIAGNOSIS — Z6827 Body mass index (BMI) 27.0-27.9, adult: Secondary | ICD-10-CM | POA: Diagnosis not present

## 2019-02-18 DIAGNOSIS — I10 Essential (primary) hypertension: Secondary | ICD-10-CM | POA: Diagnosis not present

## 2019-02-18 DIAGNOSIS — G894 Chronic pain syndrome: Secondary | ICD-10-CM | POA: Diagnosis not present

## 2019-02-18 DIAGNOSIS — Z6831 Body mass index (BMI) 31.0-31.9, adult: Secondary | ICD-10-CM | POA: Diagnosis not present

## 2019-02-18 DIAGNOSIS — M7551 Bursitis of right shoulder: Secondary | ICD-10-CM | POA: Diagnosis not present

## 2019-03-30 DIAGNOSIS — R972 Elevated prostate specific antigen [PSA]: Secondary | ICD-10-CM | POA: Diagnosis not present

## 2019-03-30 DIAGNOSIS — R8271 Bacteriuria: Secondary | ICD-10-CM | POA: Diagnosis not present

## 2019-03-30 DIAGNOSIS — N202 Calculus of kidney with calculus of ureter: Secondary | ICD-10-CM | POA: Diagnosis not present

## 2019-04-05 DIAGNOSIS — M25511 Pain in right shoulder: Secondary | ICD-10-CM | POA: Diagnosis not present

## 2019-04-11 DIAGNOSIS — N201 Calculus of ureter: Secondary | ICD-10-CM | POA: Diagnosis not present

## 2019-04-11 DIAGNOSIS — R310 Gross hematuria: Secondary | ICD-10-CM | POA: Diagnosis not present

## 2019-04-19 DIAGNOSIS — M0609 Rheumatoid arthritis without rheumatoid factor, multiple sites: Secondary | ICD-10-CM | POA: Diagnosis not present

## 2019-04-19 DIAGNOSIS — Z79899 Other long term (current) drug therapy: Secondary | ICD-10-CM | POA: Diagnosis not present

## 2019-04-25 DIAGNOSIS — R3121 Asymptomatic microscopic hematuria: Secondary | ICD-10-CM | POA: Diagnosis not present

## 2019-04-25 DIAGNOSIS — N201 Calculus of ureter: Secondary | ICD-10-CM | POA: Diagnosis not present

## 2019-04-29 ENCOUNTER — Other Ambulatory Visit: Payer: Self-pay | Admitting: Urology

## 2019-05-02 ENCOUNTER — Other Ambulatory Visit: Payer: Self-pay | Admitting: Urology

## 2019-05-02 DIAGNOSIS — M545 Low back pain: Secondary | ICD-10-CM | POA: Diagnosis not present

## 2019-05-02 DIAGNOSIS — G894 Chronic pain syndrome: Secondary | ICD-10-CM | POA: Diagnosis not present

## 2019-05-04 NOTE — Progress Notes (Addendum)
Called Darrel Reach at [facility] about xeralto. There is a note in chart at [facility] that Dr Claiborne Billings that patient to hold xeralto for 5 days .  Multiple calls to patient and one to his daughter. No answers to phone messages.

## 2019-05-05 ENCOUNTER — Encounter (HOSPITAL_COMMUNITY): Payer: Self-pay | Admitting: General Practice

## 2019-05-06 ENCOUNTER — Other Ambulatory Visit (HOSPITAL_COMMUNITY)
Admission: RE | Admit: 2019-05-06 | Discharge: 2019-05-06 | Disposition: A | Discharge status: Home / Self Care | Payer: BLUE CROSS/BLUE SHIELD | Source: Ambulatory Visit | Attending: Urology | Admitting: Urology

## 2019-05-06 DIAGNOSIS — Z1159 Encounter for screening for other viral diseases: Secondary | ICD-10-CM | POA: Diagnosis not present

## 2019-05-06 LAB — SARS CORONAVIRUS 2 BY RT PCR (HOSPITAL ORDER, PERFORMED IN ~~LOC~~ HOSPITAL LAB): SARS Coronavirus 2: NEGATIVE

## 2019-05-06 NOTE — Progress Notes (Signed)
Received call regarding processing of nasopharyngeal swab for COVID testing stating that there was not an order for the specimen received. Asked to enter order. Order for the send out test placed. Upon chart review identified that the rapid test had been performed. Send out order discontinued, order placed for the rapid turn around test as patient having procedure on Monday 05/09/19. Lab notified of order placed.

## 2019-05-07 NOTE — H&P (Signed)
Office Visit Report     04/25/2019   --------------------------------------------------------------------------------   Nathan Mcclure  MRN: [id number]  PRIMARY CARE:  Nathan Pretty, MD  DOB: 08/13/1951, 68 year old Male  REFERRING:    SSN: -[id number]  PROVIDER:  Irine Mcclure, M.D.    LOCATION:  [facility] [phone]   --------------------------------------------------------------------------------   CC: I have kidney stones.  HPI: Nathan Mcclure is a 68 year-old male established patient who is here for renal calculi.  The problem is on the right side. He first stated noticing pain on approximately 03/28/2019. This is not his first kidney stone. He is currently having flank pain. He denies having back pain, groin pain, nausea, vomiting, fever, and chills.   Nathan Mcclure returns today for his right proximal stone first seen on 03/30/19 on KUB. with hematuria and pain. He continues to have discomfort but no severe pain. KUB shows no progression of the stone. RUS shows no hydro. He has persistent microhematuria. he has no voiding complaints or other associated signs or symptoms. He remains on Xarelto.      ALLERGIES: Penicillins    MEDICATIONS: Tamsulosin Hcl 0.4 mg capsule 1 capsule PO Daily  Allegra Allergy  Amiodarone Hcl 1 PO Daily  Celebrex 200 mg capsule  Embrel  Losartan Potassium 50 mg tablet  Multi-Day Vitamins  Oxycodone Hcl 5 mg tablet PRN  Sertraline Hcl 100 mg tablet  Tadalafil 5 mg tablet 1 tablet PO Daily  Xarelto 20 mg tablet     Notes: Embrel   GU PSH: ESWL - 2017      PSH Notes: Neck Surgery, Back Surgery   NON-GU PSH: None   GU PMH: Ureteral calculus, The stone is moving but is still in the proximal ureter. I discussed MET, URS and ESWL and he would like to continue with medical therapy for now. He will return in 2 weeks with repeat imaging. - 04/14/2019, Right, He has 2 calcifications on the right that area about 4 mm. One is at the UPJ area and the  other is in the area of the distal ureter. Either could be a stone. I am going to put him on tamsulosin and have him return in 2 weeks for a KUB. He will hold the Xarelto if the bleeding persists. , - 03/30/2019 (Stable, Acute), Left, - 2017, Ureteral Stone, - 2014 Renal calculus, Stable left renal stone. - 03/30/2019 Microscopic hematuria, 3-10 RBC's today. w/u indicated with CT and cystoscopy. - 11/08/2018, - 12/08/2016 Primary hypogonadism, I will check a testosterone level today. - 11/08/2018, (Improving), His testosterone level has normalized now that he is just on the prn meds. He remains fatigued., - 2018, Hypogonadism, testicular, - 2015 Prostate nodule w/ LUTS, He has a new right prostate nodule. I will get a PSA today and have him return for prostate [place] and biopsy. risks of bleeding, infection and voiding difficulty sent. Levaquin sent. He will need to be cleared by Dr. Claiborne Mcclure to come off of Xarelto. I am going to give him tadalafil daily for his LUTS and ED. - 11/08/2018 Weak Urinary Stream (Worsening) - 11/08/2018, (Stable), - 2018 History of urolithiasis, He has 2+ blood on the dip UA today. I have gotten a KUB today and it is negative. - 2018 Gross hematuria - 2017 BPH w/LUTS, Benign prostatic hyperplasia with urinary obstruction - 2015 ED due to arterial insufficiency, Erectile dysfunction due to arterial insufficiency - 2015 Bladder-neck stenosis/contracture, Bladder neck contracture - 2014 Elevated PSA, Elevated prostate  specific antigen (PSA) - 2014 Low back pain, Lumbago - 2014 Personal Hx Oth Urinary System diseases, History of hematuria - 2014 Retrograde ejaculation, Retrograde ejaculation - 2014      PMH Notes:  2013-10-15 05:09:35 - Note: Groin (Inguinal) Pain Right Side  2007-02-02 09:11:44 - Note: Arthritis   NON-GU PMH: Bacteriuria, He does have some pyuria and bacteriuria with the hematuria so I will get a culture. - 03/30/2019 Encounter for general adult medical examination  without abnormal findings, Encounter for preventive health examination - 2015 Personal history of other diseases of the circulatory system, History of atrial fibrillation - 2015, History of hypertension, - 2014 Anxiety, Anxiety - 2014 Decreased libido, Decreased libido - 2014 Personal history of other diseases of the digestive system, History of esophageal reflux - 2014 Personal history of other endocrine, nutritional and metabolic disease, History of hypercholesterolemia - 2014 Personal history of other specified conditions, History of heartburn - 2014    FAMILY HISTORY: Family Health Status Number - Runs In Family Prostate Cancer - Father   SOCIAL HISTORY: Marital Status: Married Preferred Language: English; Race: White Current Smoking Status: Patient does not smoke anymore.  Does not use smokeless tobacco. Has never drank.  Does not use drugs. Drinks 3 caffeinated drinks per day. Has not had a blood transfusion.     Notes: Former smoker, Occupation:, Caffeine Use, Marital History - Currently Married, Alcohol Use, Tobacco Use   REVIEW OF SYSTEMS:    GU Review Male:   Patient denies frequent urination, hard to postpone urination, burning/ pain with urination, get up at night to urinate, leakage of urine, stream starts and stops, trouble starting your stream, have to strain to urinate , erection problems, and penile pain.  Gastrointestinal (Upper):   Patient denies nausea, vomiting, and indigestion/ heartburn.  Gastrointestinal (Lower):   Patient denies diarrhea and constipation.  Constitutional:   Patient denies fever, night sweats, weight loss, and fatigue.  Skin:   Patient denies skin rash/ lesion and itching.  Eyes:   Patient denies blurred vision and double vision.  Ears/ Nose/ Throat:   Patient denies sore throat and sinus problems.  Hematologic/Lymphatic:   Patient denies swollen glands and easy bruising.  Cardiovascular:   Patient denies leg swelling and chest pains.   Respiratory:   Patient denies cough and shortness of breath.  Endocrine:   Patient denies excessive thirst.  Musculoskeletal:   Patient denies back pain and joint pain.  Neurological:   Patient denies headaches and dizziness.  Psychologic:   Patient denies anxiety and depression.   Notes: R sided pain    VITAL SIGNS:      04/25/2019 10:37 AM  BP 135/80 mmHg  Heart Rate 56 /min  Temperature 97.2 F / 36.2 C   MULTI-SYSTEM PHYSICAL EXAMINATION:    Constitutional: Well-nourished. No physical deformities. Normally developed. Good grooming.  Respiratory: Normal breath sounds. No labored breathing, no use of accessory muscles.   Cardiovascular: Regular rate and rhythm. No murmur, no gallop.      PAST DATA REVIEWED:  Source Of History:  Patient  Urine Test Review:   Urinalysis  X-Ray Review: KUB: Reviewed Films. Discussed With Patient.  Renal Ultrasound: Reviewed Films. Discussed With Patient.     03/30/19 11/08/18 12/08/16 09/30/13 12/29/11 02/26/11 11/01/10 08/22/09  PSA  Total PSA 4.86 ng/mL 4.85 ng/mL 3.60 ng/dl 3.38  2.03  2.32  1.69  2.51   Free PSA 0.99 ng/mL 0.77 ng/mL        % Free  PSA 20 % PSA 16 % PSA          12/08/16 11/10/14 10/01/13 12/29/11 02/26/11 12/18/10 11/20/10 11/01/10  Hormones  Testosterone, Total 354.7 pg/dL 391  162  243.21  427.29  240.16  163.63  134.38     PROCEDURES:         KUB - 74018  A single view of the abdomen is obtained. The right proximal stone has not changed in location and remains over the L3 transverse process. He has stable spinal hardware but no gas or soft tissue abnormalities.                 Renal Ultrasound - 67619  Right Kidney: Length: 10.9 cm Depth: 6.1 cm Cortical Width: 1.8 cm Width:6.1 cm  Left Kidney: Length:10.9 cm Depth:5.4 cm Cortical Width:1.4 cm Width: 6.0 cm  Left Kidney/Ureter:  ? Cystic area LP= 0.68cm  Right Kidney/Ureter:  Multiple echogenic foci 10 LP= 0.19cm 2) LP= 0.25cm  Bladder:  PVR= 10.28ML.        Patient confirmed No Neulasta OnPro Device.           Urinalysis w/Scope Dipstick Dipstick Cont'd Micro  Color: Yellow Bilirubin: Neg mg/dL WBC/hpf: NS (Not Seen)  Appearance: Clear Ketones: Neg mg/dL RBC/hpf: 10 - 20/hpf  Specific Gravity: 1.025 Blood: 3+ ery/uL Bacteria: Few (10-25/hpf)  pH: <=5.0 Protein: Neg mg/dL Cystals: NS (Not Seen)  Glucose: Neg mg/dL Urobilinogen: 0.2 mg/dL Casts: NS (Not Seen)    Nitrites: Neg Trichomonas: Not Present    Leukocyte Esterase: Neg leu/uL Mucous: Not Present      Epithelial Cells: 0 - 5/hpf      Yeast: NS (Not Seen)      Sperm: Not Present    ASSESSMENT:      ICD-10 Details  1 GU:   Ureteral calculus - N20.1 Stable - His stone has not progressed. I discussed URS vs ESWL and he would like to proceed with ESWL next week. He has recently been cleared by Dr. Claiborne Mcclure to hold the Xarelto. I reviewed the risks of ESWL including bleeding, infection, injury to the kidney or adjacent structures, failure to fragment the stone, need for ancillary procedures, thrombotic events, cardiac arrhythmias and sedation complications.   2   Microscopic hematuria - R31.21 Stable   PLAN:           Schedule Return Visit/Planned Activity: 1 Week - Schedule Surgery             Note: ESWL  Procedure: Approximately 1 Week - ESWL - 50932          Document Letter(s):  Created for Patient: Clinical Summary         Notes:   CC: Dr. Deland Mcclure and Dr. Shelva Majestic.         Next Appointment:      Next Appointment: 05/12/2019 01:15 PM    Appointment Type: Prostate Ultrasound Biopsy    Location: [facility] [phone]    Provider: Radiology Rm1 Radiology Rm 1    Reason for Visit: 4-6 wk TRUSP      * Signed by Nathan Mcclure, M.D. on 04/25/19 at 11:18 AM (EDT)*     The information contained in this medical record document is considered private and confidential patient information. This information can only be used for the medical diagnosis  and/or medical services that are being provided by the patient's selected caregivers. This information can only be distributed outside of the patient's care  if the patient agrees and signs waivers of authorization for this information to be sent to an outside source or route.  Add: 03/30/2019 cx negative

## 2019-05-09 ENCOUNTER — Encounter (HOSPITAL_COMMUNITY)
Admission: RE | Disposition: A | Discharge status: Home / Self Care | Payer: Self-pay | Source: Home / Self Care | Attending: Urology

## 2019-05-09 ENCOUNTER — Ambulatory Visit (HOSPITAL_COMMUNITY)
Admission: RE | Admit: 2019-05-09 | Discharge: 2019-05-09 | Disposition: A | Discharge status: Home / Self Care | Payer: BLUE CROSS/BLUE SHIELD | Attending: Urology | Admitting: Urology

## 2019-05-09 ENCOUNTER — Encounter (HOSPITAL_COMMUNITY): Payer: Self-pay | Admitting: General Practice

## 2019-05-09 ENCOUNTER — Ambulatory Visit (HOSPITAL_COMMUNITY): Payer: BLUE CROSS/BLUE SHIELD

## 2019-05-09 DIAGNOSIS — Z87891 Personal history of nicotine dependence: Secondary | ICD-10-CM | POA: Diagnosis not present

## 2019-05-09 DIAGNOSIS — Z791 Long term (current) use of non-steroidal anti-inflammatories (NSAID): Secondary | ICD-10-CM | POA: Insufficient documentation

## 2019-05-09 DIAGNOSIS — Z87442 Personal history of urinary calculi: Secondary | ICD-10-CM | POA: Insufficient documentation

## 2019-05-09 DIAGNOSIS — N201 Calculus of ureter: Secondary | ICD-10-CM | POA: Diagnosis not present

## 2019-05-09 DIAGNOSIS — Z7901 Long term (current) use of anticoagulants: Secondary | ICD-10-CM | POA: Diagnosis not present

## 2019-05-09 DIAGNOSIS — Z880 Allergy status to penicillin: Secondary | ICD-10-CM | POA: Diagnosis not present

## 2019-05-09 DIAGNOSIS — Z79899 Other long term (current) drug therapy: Secondary | ICD-10-CM | POA: Diagnosis not present

## 2019-05-09 DIAGNOSIS — R109 Unspecified abdominal pain: Secondary | ICD-10-CM | POA: Diagnosis not present

## 2019-05-09 DIAGNOSIS — N200 Calculus of kidney: Secondary | ICD-10-CM | POA: Diagnosis not present

## 2019-05-09 HISTORY — DX: Personal history of urinary calculi: Z87.442

## 2019-05-09 HISTORY — PX: EXTRACORPOREAL SHOCK WAVE LITHOTRIPSY: SHX1557

## 2019-05-09 SURGERY — LITHOTRIPSY, ESWL
Anesthesia: LOCAL | Laterality: Right

## 2019-05-09 MED ORDER — CIPROFLOXACIN HCL 500 MG PO TABS
500.0000 mg | ORAL_TABLET | ORAL | Status: AC
  Administered 2019-05-09: 500 mg via ORAL
  Filled 2019-05-09: qty 1

## 2019-05-09 MED ORDER — SODIUM CHLORIDE 0.9 % IV SOLN
INTRAVENOUS | Status: DC
  Administered 2019-05-09: 08:00:00 via INTRAVENOUS

## 2019-05-09 MED ORDER — DIAZEPAM 5 MG PO TABS
10.0000 mg | ORAL_TABLET | ORAL | Status: AC
  Administered 2019-05-09: 10 mg via ORAL
  Filled 2019-05-09: qty 2

## 2019-05-09 MED ORDER — DIPHENHYDRAMINE HCL 25 MG PO CAPS
25.0000 mg | ORAL_CAPSULE | ORAL | Status: AC
  Administered 2019-05-09: 25 mg via ORAL
  Filled 2019-05-09: qty 1

## 2019-05-09 NOTE — Discharge Instructions (Signed)

## 2019-05-09 NOTE — Interval H&P Note (Signed)
History and Physical Interval Note:  05/09/2019 10:59 AM  Nathan Mcclure  has presented today for surgery, with the diagnosis of RIGHT PROXIMAL STONE.  The various methods of treatment have been discussed with the patient and family. After consideration of risks, benefits and other options for treatment, the patient has consented to  Procedure(s): EXTRACORPOREAL SHOCK WAVE LITHOTRIPSY (ESWL) (Right) as a surgical intervention.  The patient's history has been reviewed, patient examined, no change in status, stable for surgery. He has right flank pain and hasnt seen stone pass. KUB with stable stone , 4 mm, right prox ureter adjacent to L3.  I have reviewed the patient's chart and labs.  Questions were answered to the patient's satisfaction.     Nathan Mcclure

## 2019-05-09 NOTE — Op Note (Signed)
Right proximal stone, 4 mm  Right ESWL   Findings: He moved quite a bit which limited our ability to target the stone and get the energy up. We recoupled and started again after 1500 shocks. He moved and we had to recouple again at 2000 shocks and at 2700 shocks. Also at 3500 shocks he pulled off. He may need a staged procedure.

## 2019-05-10 ENCOUNTER — Encounter (HOSPITAL_COMMUNITY): Payer: Self-pay | Admitting: Urology

## 2019-05-18 ENCOUNTER — Telehealth: Payer: Self-pay

## 2019-05-18 MED ORDER — RIVAROXABAN 20 MG PO TABS: 20.0000 mg | ORAL_TABLET | Freq: Every day | ORAL | 5 refills | Status: DC

## 2019-05-18 NOTE — Telephone Encounter (Signed)
68yo Male 102kg Scr 0.7 on 11/08/2018 OV with Almyra Deforest on 12/16/2018

## 2019-05-23 DIAGNOSIS — N201 Calculus of ureter: Secondary | ICD-10-CM | POA: Diagnosis not present

## 2019-05-23 DIAGNOSIS — N200 Calculus of kidney: Secondary | ICD-10-CM | POA: Diagnosis not present

## 2019-06-02 DIAGNOSIS — M25511 Pain in right shoulder: Secondary | ICD-10-CM | POA: Diagnosis not present

## 2019-06-07 DIAGNOSIS — M25511 Pain in right shoulder: Secondary | ICD-10-CM | POA: Diagnosis not present

## 2019-06-09 DIAGNOSIS — M25511 Pain in right shoulder: Secondary | ICD-10-CM | POA: Diagnosis not present

## 2019-06-15 ENCOUNTER — Telehealth: Payer: Self-pay | Admitting: Cardiovascular Disease

## 2019-06-15 NOTE — Telephone Encounter (Signed)
Clinical pharmacist to review how long to hold Xarelto. I plan to call the patient afterward to make sure he is stable from cardiac perspective to proceed with surgery

## 2019-06-15 NOTE — Telephone Encounter (Signed)
   [facility] Pre-operative Risk Assessment    Request for surgical clearance:  1. What type of surgery is being performed? Right Shoulder Scope Rotator Cuff Repair   2. When is this surgery scheduled? TBD   3. What type of clearance is required (medical clearance vs. Pharmacy clearance to hold med vs. Both)? Both  4. Are there any medications that need to be held prior to surgery and how long? Xarelto, how long TBD by Pharm or Cardiology   5. Practice name and name of physician performing surgery? [facility]; Dr. Earlie Server   6. What is your office phone number [phone]    7.   What is your office fax number [phone] ATTN: Kelly  8.   Anesthesia type (None, local, MAC, general) ? General   Therisa Doyne 06/15/2019, 1:42 PM  _________________________________________________________________   (provider comments below)

## 2019-06-16 NOTE — Telephone Encounter (Signed)
Left message for the patient to call back and speak to the on call preop APP 

## 2019-06-16 NOTE — Telephone Encounter (Signed)
Pt takes Xarelto for afib with CHADS2VASc score of 3 (age, HTN, CAD). SCr 0.7 on 10/2018, CrCl > 175mL/min. Ok to hold Xarelto for 2-3 days prior to procedure.

## 2019-06-20 NOTE — Telephone Encounter (Signed)
Left voicemail to return call. 

## 2019-06-21 DIAGNOSIS — Z01818 Encounter for other preprocedural examination: Secondary | ICD-10-CM | POA: Diagnosis not present

## 2019-06-21 DIAGNOSIS — I4891 Unspecified atrial fibrillation: Secondary | ICD-10-CM | POA: Diagnosis not present

## 2019-06-21 NOTE — Telephone Encounter (Signed)
   Primary Cardiologist: Shelva Majestic, MD  Chart reviewed as part of pre-operative protocol coverage. Patient was contacted 06/21/2019 in reference to pre-operative risk assessment for pending surgery as outlined below.  Nathan Mcclure was last seen on 12/16/18 by Almyra Deforest PAC.  Since that day, Nathan Mcclure has done well. He can complete more than 4.0 METS without anginal symptoms. He has had a heart cath 15 years ago with nonobstructive disease.  Per our pharmacy staff: Pt takes Xarelto for afib with CHADS2VASc score of 3 (age, HTN, CAD). SCr 0.7 on 10/2018, CrCl > 169mL/min. Ok to hold Xarelto for 2-3 days prior to procedure.  Therefore, based on ACC/AHA guidelines, the patient would be at acceptable risk for the planned procedure without further cardiovascular testing.   I will route this recommendation to the requesting party via Epic fax function and remove from pre-op pool.  Please call with questions.  Tami Lin Duke, PA 06/21/2019, 11:29 AM

## 2019-06-23 ENCOUNTER — Ambulatory Visit: Payer: Self-pay | Admitting: Physician Assistant

## 2019-06-23 NOTE — H&P (Signed)
Nathan Mcclure is an 68 y.o. male.   Chief Complaint: right shoulder pain HPI: Large, retracted tear.  He is in a lot of pain.  Works full time at [employer].  He is going to try to keep working.   Past Medical History:  Diagnosis Date  . Anxiety   . Arthritis   . Atrial fibrillation ([place])   . Coronary artery disease    history of Atrial Fib.  . Depression   . History of kidney stones   . Hyperlipidemia   . Hypertension   . kidney stone   . Low testosterone   . Palpitations   . Rheumatoid arthritis [facility])     Past Surgical History:  Procedure Laterality Date  . BACK SURGERY  2005  . CARDIAC CATHETERIZATION  05/14/2004   Contiued medical therapy  . CARDIOVASCULAR STRESS TEST  09/19/2009   Observed defect is consistent with diaphragmatic attentuation. EKG negative for ischemia.  Marland Kitchen CARDIOVERSION N/A 11/20/2014   Procedure: CARDIOVERSION;  Surgeon: Troy Sine, MD;  Location: [facility] ENDOSCOPY;  Service: Cardiovascular;  Laterality: N/A;  . CARDIOVERSION N/A 02/19/2016   Procedure: CARDIOVERSION;  Surgeon: Troy Sine, MD;  Location: [place];  Service: Cardiovascular;  Laterality: N/A;  . CERVICAL FUSION  2005  . EXTRACORPOREAL SHOCK WAVE LITHOTRIPSY Right 05/09/2019   Procedure: EXTRACORPOREAL SHOCK WAVE LITHOTRIPSY (ESWL);  Surgeon: Festus Aloe, MD;  Location: [facility] ORS;  Service: Urology;  Laterality: Right;  . [place], 2007   stent insertion, stone basket   . LITHOTRIPSY    . TONSILLECTOMY     as a child  . TRANSTHORACIC ECHOCARDIOGRAM  03/29/2008   EF 67%, mild mitral and tricuspid valve regurg.    Family History  Problem Relation Age of Onset  . Diabetes Mother   . Kidney failure Mother   . Heart disease Mother   . Prostate cancer Father   . Alzheimer's disease Father   . Brain cancer Sister   . Lung cancer Brother   . Heart disease Sister   . Tremor Daughter   . Alopecia Daughter    Social History:  reports that he quit smoking about 15  years ago. His smoking use included cigarettes. He has never used smokeless tobacco. He reports current alcohol use of about 4.0 standard drinks of alcohol per week. He reports that he does not use drugs.  Allergies:  Allergies  Allergen Reactions  . Penicillins     (Not in a hospital admission)   No results found for this or any previous visit (from the past 48 hour(s)). No results found.  Review of Systems  Musculoskeletal: Positive for joint pain.  All other systems reviewed and are negative.   There were no vitals taken for this visit. Physical Exam  Constitutional: He is oriented to person, place, and time. He appears well-developed and well-nourished. No distress.  HENT:  Head: Normocephalic and atraumatic.  Eyes: Pupils are equal, round, and reactive to light. Conjunctivae and EOM are normal.  Neck: Normal range of motion. Neck supple.  Cardiovascular: Normal rate and intact distal pulses.  Respiratory: Effort normal. No respiratory distress.  Musculoskeletal:     Right shoulder: He exhibits decreased range of motion, tenderness, pain and decreased strength.  Neurological: He is alert and oriented to person, place, and time.  Skin: Skin is warm and dry. No rash noted. No erythema.  Psychiatric: He has a normal mood and affect. His behavior is normal.  Assessment/Plan He wants to have surgery based on his degree of difficulty.  It does look like a repairable cuff.  There is some chance it may not be.  Risks and benefits of arthroscopy, acromioplasty, distal clavicle, rotator cuff right shoulder discussed with the patient. He needs clearance.  He is on Xarelto for afib.  Also taking 1-2 oxy per day Dr. Towanda Malkin practice for chronic neck problems.  Risks and benefits discussed.  Proceed on with scheduling in mid-July per his desire.  He wants to continue work.  If he changes his mind, he would be a candidate to come out of work at any time.  Chriss Czar,  PA-C 06/23/2019, 2:50 PM

## 2019-06-23 NOTE — H&P (View-Only) (Signed)
Nathan Mcclure is an 68 y.o. male.   Chief Complaint: right shoulder pain HPI: Large, retracted tear.  He is in a lot of pain.  Works full time at [employer].  He is going to try to keep working.   Past Medical History:  Diagnosis Date  . Anxiety   . Arthritis   . Atrial fibrillation ([place])   . Coronary artery disease    history of Atrial Fib.  . Depression   . History of kidney stones   . Hyperlipidemia   . Hypertension   . kidney stone   . Low testosterone   . Palpitations   . Rheumatoid arthritis [facility])     Past Surgical History:  Procedure Laterality Date  . BACK SURGERY  2005  . CARDIAC CATHETERIZATION  05/14/2004   Contiued medical therapy  . CARDIOVASCULAR STRESS TEST  09/19/2009   Observed defect is consistent with diaphragmatic attentuation. EKG negative for ischemia.  Marland Kitchen CARDIOVERSION N/A 11/20/2014   Procedure: CARDIOVERSION;  Surgeon: Troy Sine, MD;  Location: [facility] ENDOSCOPY;  Service: Cardiovascular;  Laterality: N/A;  . CARDIOVERSION N/A 02/19/2016   Procedure: CARDIOVERSION;  Surgeon: Troy Sine, MD;  Location: [place];  Service: Cardiovascular;  Laterality: N/A;  . CERVICAL FUSION  2005  . EXTRACORPOREAL SHOCK WAVE LITHOTRIPSY Right 05/09/2019   Procedure: EXTRACORPOREAL SHOCK WAVE LITHOTRIPSY (ESWL);  Surgeon: Festus Aloe, MD;  Location: [facility] ORS;  Service: Urology;  Laterality: Right;  . [place], 2007   stent insertion, stone basket   . LITHOTRIPSY    . TONSILLECTOMY     as a child  . TRANSTHORACIC ECHOCARDIOGRAM  03/29/2008   EF 67%, mild mitral and tricuspid valve regurg.    Family History  Problem Relation Age of Onset  . Diabetes Mother   . Kidney failure Mother   . Heart disease Mother   . Prostate cancer Father   . Alzheimer's disease Father   . Brain cancer Sister   . Lung cancer Brother   . Heart disease Sister   . Tremor Daughter   . Alopecia Daughter    Social History:  reports that he quit smoking about 15  years ago. His smoking use included cigarettes. He has never used smokeless tobacco. He reports current alcohol use of about 4.0 standard drinks of alcohol per week. He reports that he does not use drugs.  Allergies:  Allergies  Allergen Reactions  . Penicillins     (Not in a hospital admission)   No results found for this or any previous visit (from the past 48 hour(s)). No results found.  Review of Systems  Musculoskeletal: Positive for joint pain.  All other systems reviewed and are negative.   There were no vitals taken for this visit. Physical Exam  Constitutional: He is oriented to person, place, and time. He appears well-developed and well-nourished. No distress.  HENT:  Head: Normocephalic and atraumatic.  Eyes: Pupils are equal, round, and reactive to light. Conjunctivae and EOM are normal.  Neck: Normal range of motion. Neck supple.  Cardiovascular: Normal rate and intact distal pulses.  Respiratory: Effort normal. No respiratory distress.  Musculoskeletal:     Right shoulder: He exhibits decreased range of motion, tenderness, pain and decreased strength.  Neurological: He is alert and oriented to person, place, and time.  Skin: Skin is warm and dry. No rash noted. No erythema.  Psychiatric: He has a normal mood and affect. His behavior is normal.  Assessment/Plan He wants to have surgery based on his degree of difficulty.  It does look like a repairable cuff.  There is some chance it may not be.  Risks and benefits of arthroscopy, acromioplasty, distal clavicle, rotator cuff right shoulder discussed with the patient. He needs clearance.  He is on Xarelto for afib.  Also taking 1-2 oxy per day Dr. Towanda Malkin practice for chronic neck problems.  Risks and benefits discussed.  Proceed on with scheduling in mid-July per his desire.  He wants to continue work.  If he changes his mind, he would be a candidate to come out of work at any time.  Chriss Czar,  PA-C 06/23/2019, 2:50 PM

## 2019-06-27 IMAGING — DX ABDOMEN - 1 VIEW
2 series · 2 of 2 positions shown · non-contrast
Comparison: 04/25/2019

CLINICAL DATA: Right ureteral calculus.  Right-sided pain.

EXAM:
ABDOMEN - 1 VIEW

[abdomen kub (1 of 2)]
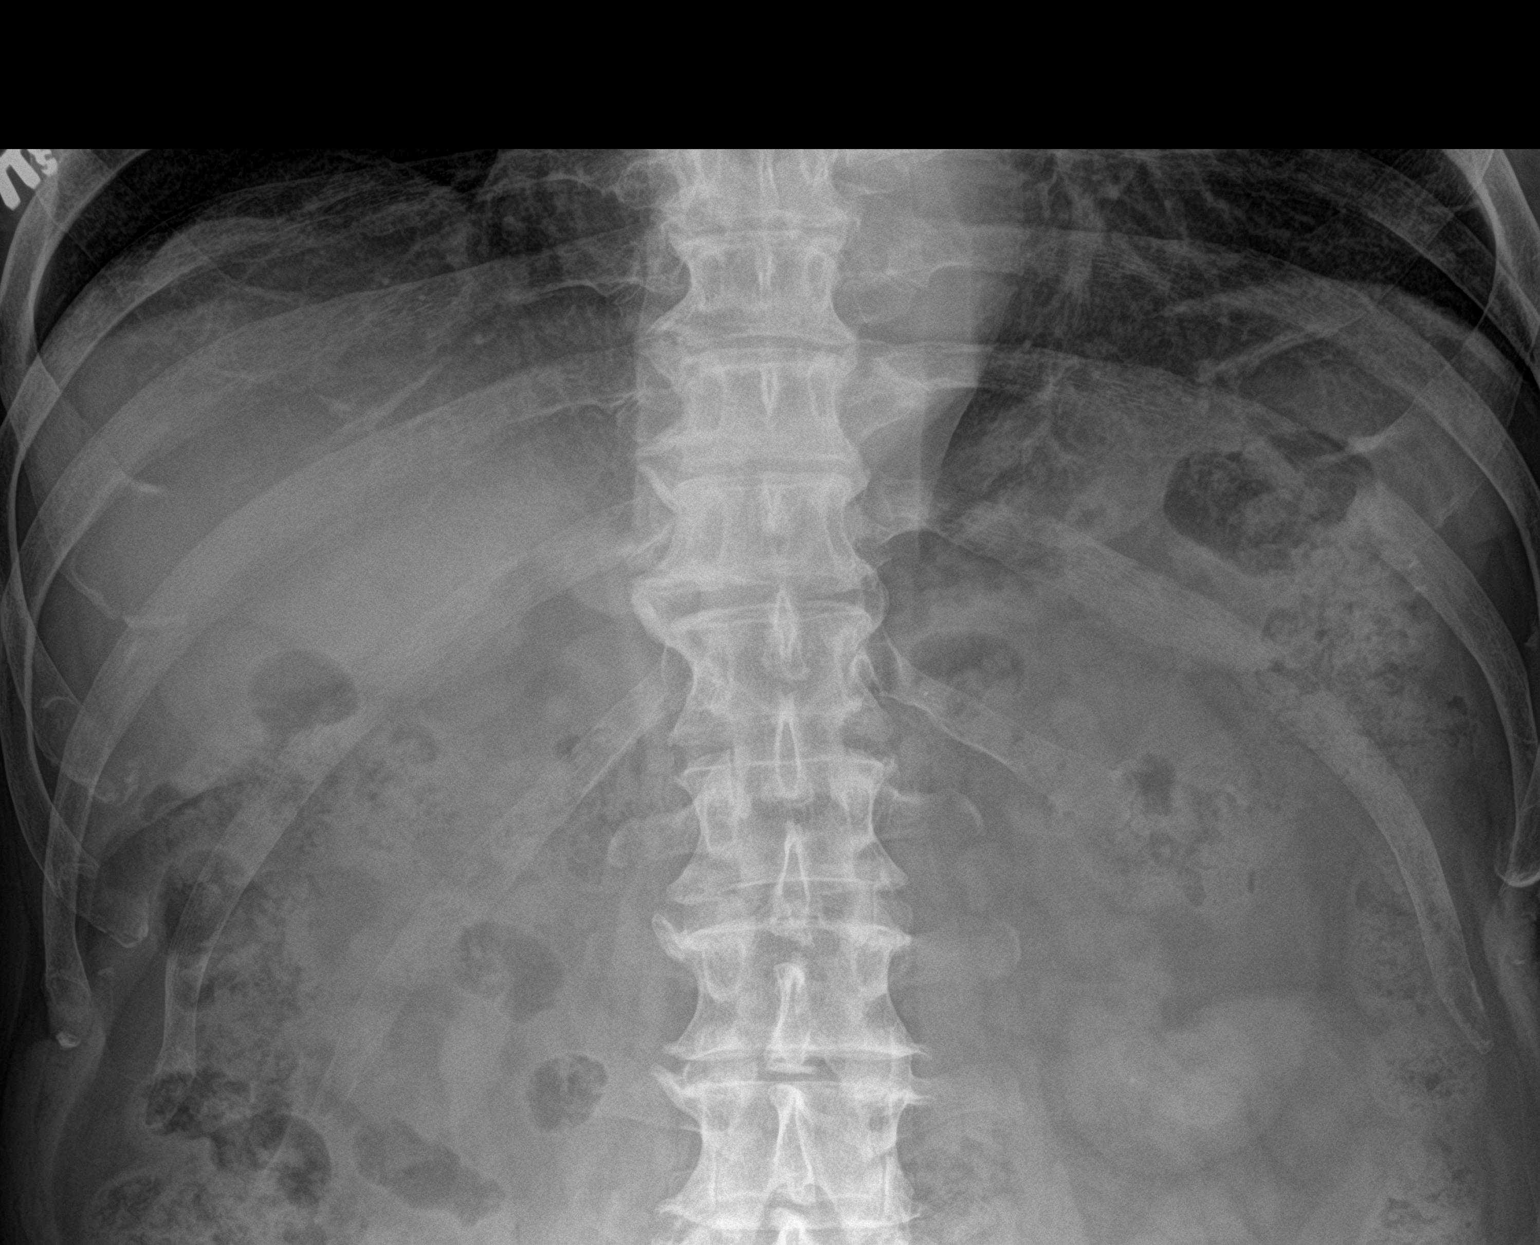

[abdomen kub (2 of 2)]
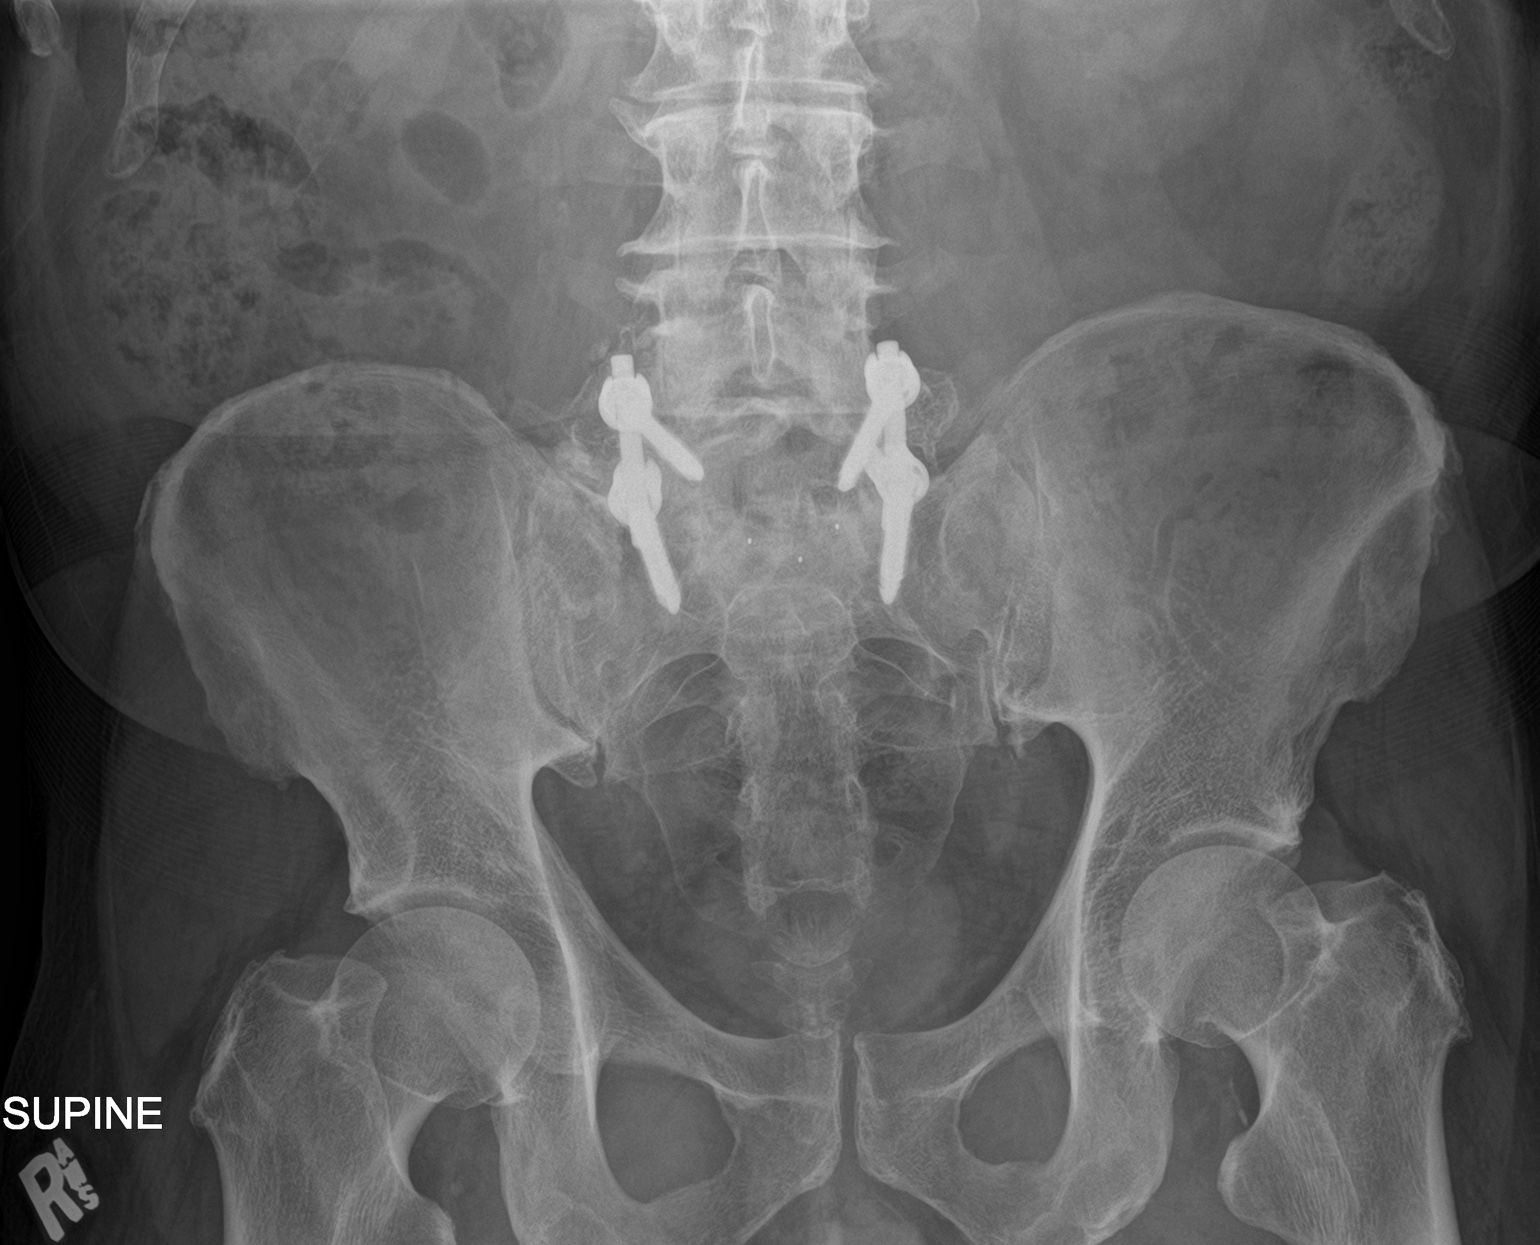

[2 of 2 positions shown; findings below may reference images not displayed]

FINDINGS: There is no bowel dilatation to suggest obstruction. There is no
evidence of pneumoperitoneum, portal venous gas or pneumatosis.

3 mm calcification projecting over the right psoas muscle at the
level of L3-4 concerning for a ureteral calculus. Punctate
calcification projecting over the left kidney likely reflecting a
small calculus.

The osseous structures are unremarkable.
IMPRESSION: 3 mm calcification projecting over the right psoas muscle at the
level of L3-4 concerning for a ureteral calculus.

## 2019-06-29 ENCOUNTER — Other Ambulatory Visit: Payer: Self-pay

## 2019-06-29 ENCOUNTER — Encounter (HOSPITAL_BASED_OUTPATIENT_CLINIC_OR_DEPARTMENT_OTHER): Payer: Self-pay | Admitting: *Deleted

## 2019-07-02 ENCOUNTER — Other Ambulatory Visit (HOSPITAL_COMMUNITY)
Admission: RE | Admit: 2019-07-02 | Discharge: 2019-07-02 | Disposition: A | Discharge status: Home / Self Care | Payer: BC Managed Care – PPO | Source: Ambulatory Visit | Attending: Orthopedic Surgery | Admitting: Orthopedic Surgery

## 2019-07-02 DIAGNOSIS — Z1159 Encounter for screening for other viral diseases: Secondary | ICD-10-CM | POA: Insufficient documentation

## 2019-07-02 DIAGNOSIS — Z01812 Encounter for preprocedural laboratory examination: Secondary | ICD-10-CM | POA: Diagnosis not present

## 2019-07-02 LAB — SARS CORONAVIRUS 2 (TAT 6-24 HRS): SARS Coronavirus 2: NEGATIVE

## 2019-07-06 ENCOUNTER — Other Ambulatory Visit: Payer: Self-pay

## 2019-07-06 ENCOUNTER — Ambulatory Visit (HOSPITAL_BASED_OUTPATIENT_CLINIC_OR_DEPARTMENT_OTHER): Payer: BC Managed Care – PPO | Admitting: Certified Registered"

## 2019-07-06 ENCOUNTER — Encounter (HOSPITAL_BASED_OUTPATIENT_CLINIC_OR_DEPARTMENT_OTHER)
Admission: RE | Disposition: A | Discharge status: Home / Self Care | Payer: Self-pay | Source: Home / Self Care | Attending: Orthopedic Surgery

## 2019-07-06 ENCOUNTER — Encounter (HOSPITAL_BASED_OUTPATIENT_CLINIC_OR_DEPARTMENT_OTHER): Payer: Self-pay | Admitting: *Deleted

## 2019-07-06 ENCOUNTER — Ambulatory Visit (HOSPITAL_BASED_OUTPATIENT_CLINIC_OR_DEPARTMENT_OTHER)
Admission: RE | Admit: 2019-07-06 | Discharge: 2019-07-07 | Disposition: A | Discharge status: Home / Self Care | Payer: BC Managed Care – PPO | Attending: Orthopedic Surgery | Admitting: Orthopedic Surgery

## 2019-07-06 DIAGNOSIS — S43431A Superior glenoid labrum lesion of right shoulder, initial encounter: Secondary | ICD-10-CM | POA: Insufficient documentation

## 2019-07-06 DIAGNOSIS — Z7901 Long term (current) use of anticoagulants: Secondary | ICD-10-CM | POA: Diagnosis not present

## 2019-07-06 DIAGNOSIS — M7541 Impingement syndrome of right shoulder: Secondary | ICD-10-CM | POA: Diagnosis not present

## 2019-07-06 DIAGNOSIS — F419 Anxiety disorder, unspecified: Secondary | ICD-10-CM | POA: Insufficient documentation

## 2019-07-06 DIAGNOSIS — E785 Hyperlipidemia, unspecified: Secondary | ICD-10-CM | POA: Insufficient documentation

## 2019-07-06 DIAGNOSIS — M069 Rheumatoid arthritis, unspecified: Secondary | ICD-10-CM | POA: Insufficient documentation

## 2019-07-06 DIAGNOSIS — D759 Disease of blood and blood-forming organs, unspecified: Secondary | ICD-10-CM | POA: Diagnosis not present

## 2019-07-06 DIAGNOSIS — M19011 Primary osteoarthritis, right shoulder: Secondary | ICD-10-CM | POA: Insufficient documentation

## 2019-07-06 DIAGNOSIS — Z87891 Personal history of nicotine dependence: Secondary | ICD-10-CM | POA: Diagnosis not present

## 2019-07-06 DIAGNOSIS — K219 Gastro-esophageal reflux disease without esophagitis: Secondary | ICD-10-CM | POA: Diagnosis not present

## 2019-07-06 DIAGNOSIS — G4730 Sleep apnea, unspecified: Secondary | ICD-10-CM | POA: Insufficient documentation

## 2019-07-06 DIAGNOSIS — M75121 Complete rotator cuff tear or rupture of right shoulder, not specified as traumatic: Secondary | ICD-10-CM | POA: Insufficient documentation

## 2019-07-06 DIAGNOSIS — Z79899 Other long term (current) drug therapy: Secondary | ICD-10-CM | POA: Diagnosis not present

## 2019-07-06 DIAGNOSIS — I2510 Atherosclerotic heart disease of native coronary artery without angina pectoris: Secondary | ICD-10-CM | POA: Insufficient documentation

## 2019-07-06 DIAGNOSIS — I10 Essential (primary) hypertension: Secondary | ICD-10-CM | POA: Insufficient documentation

## 2019-07-06 DIAGNOSIS — F329 Major depressive disorder, single episode, unspecified: Secondary | ICD-10-CM | POA: Diagnosis not present

## 2019-07-06 DIAGNOSIS — M24111 Other articular cartilage disorders, right shoulder: Secondary | ICD-10-CM | POA: Diagnosis not present

## 2019-07-06 DIAGNOSIS — G8918 Other acute postprocedural pain: Secondary | ICD-10-CM | POA: Diagnosis not present

## 2019-07-06 DIAGNOSIS — I4891 Unspecified atrial fibrillation: Secondary | ICD-10-CM | POA: Diagnosis present

## 2019-07-06 HISTORY — PX: RESECTION DISTAL CLAVICAL: SHX5053

## 2019-07-06 HISTORY — PX: SHOULDER ARTHROSCOPY WITH ROTATOR CUFF REPAIR AND SUBACROMIAL DECOMPRESSION: SHX5686

## 2019-07-06 HISTORY — DX: Gastro-esophageal reflux disease without esophagitis: K21.9

## 2019-07-06 HISTORY — DX: Sleep apnea, unspecified: G47.30

## 2019-07-06 SURGERY — SHOULDER ARTHROSCOPY WITH ROTATOR CUFF REPAIR AND SUBACROMIAL DECOMPRESSION
Anesthesia: General | Site: Shoulder | Laterality: Right

## 2019-07-06 MED ORDER — ONDANSETRON HCL 4 MG/2ML IJ SOLN
INTRAMUSCULAR | Status: DC | PRN
  Administered 2019-07-06: 4 mg via INTRAVENOUS

## 2019-07-06 MED ORDER — SERTRALINE HCL 100 MG PO TABS: 100.0000 mg | ORAL_TABLET | Freq: Every day | ORAL | Status: DC

## 2019-07-06 MED ORDER — OXYCODONE HCL 5 MG PO TABS: 5.0000 mg | ORAL_TABLET | Freq: Once | ORAL | Status: DC | PRN

## 2019-07-06 MED ORDER — OXYCODONE HCL 5 MG PO TABS: ORAL_TABLET | ORAL | 0 refills | Status: DC

## 2019-07-06 MED ORDER — ACETAMINOPHEN 500 MG PO TABS: 1000.0000 mg | ORAL_TABLET | Freq: Four times a day (QID) | ORAL | Status: DC | PRN

## 2019-07-06 MED ORDER — LIDOCAINE HCL (CARDIAC) PF 100 MG/5ML IV SOSY
PREFILLED_SYRINGE | INTRAVENOUS | Status: DC | PRN
  Administered 2019-07-06: 30 mg via INTRAVENOUS

## 2019-07-06 MED ORDER — RIVAROXABAN 20 MG PO TABS: 20.0000 mg | ORAL_TABLET | Freq: Every day | ORAL | Status: DC

## 2019-07-06 MED ORDER — VANCOMYCIN HCL IN DEXTROSE 1-5 GM/200ML-% IV SOLN
1000.0000 mg | INTRAVENOUS | Status: AC
  Administered 2019-07-06: 1000 mg via INTRAVENOUS

## 2019-07-06 MED ORDER — SODIUM CHLORIDE 0.9 % IV SOLN
INTRAVENOUS | Status: DC
  Administered 2019-07-06: 15:00:00 via INTRAVENOUS

## 2019-07-06 MED ORDER — CLINDAMYCIN PHOSPHATE 600 MG/50ML IV SOLN
600.0000 mg | Freq: Four times a day (QID) | INTRAVENOUS | Status: AC
  Administered 2019-07-06 – 2019-07-07 (×3): 600 mg via INTRAVENOUS

## 2019-07-06 MED ORDER — TIZANIDINE HCL 2 MG PO TABS: 2.0000 mg | ORAL_TABLET | Freq: Four times a day (QID) | ORAL | Status: DC | PRN

## 2019-07-06 MED ORDER — MIDAZOLAM HCL 2 MG/2ML IJ SOLN
INTRAMUSCULAR | Status: AC
  Filled 2019-07-06: qty 2

## 2019-07-06 MED ORDER — METHYLPREDNISOLONE ACETATE 80 MG/ML IJ SUSP
INTRAMUSCULAR | Status: AC
  Filled 2019-07-06: qty 1

## 2019-07-06 MED ORDER — GLYCOPYRROLATE 0.2 MG/ML IJ SOLN
INTRAMUSCULAR | Status: DC | PRN
  Administered 2019-07-06: 0.2 mg via INTRAVENOUS

## 2019-07-06 MED ORDER — BUPIVACAINE LIPOSOME 1.3 % IJ SUSP
INTRAMUSCULAR | Status: DC | PRN
  Administered 2019-07-06: 10 mL via PERINEURAL

## 2019-07-06 MED ORDER — LACTATED RINGERS IV SOLN
INTRAVENOUS | Status: DC
  Administered 2019-07-06 (×2): via INTRAVENOUS

## 2019-07-06 MED ORDER — ROSUVASTATIN CALCIUM 20 MG PO TABS: 20.0000 mg | ORAL_TABLET | Freq: Every day | ORAL | Status: DC

## 2019-07-06 MED ORDER — CHLORHEXIDINE GLUCONATE 4 % EX LIQD: 60.0000 mL | Freq: Once | CUTANEOUS | Status: DC

## 2019-07-06 MED ORDER — MEPERIDINE HCL 25 MG/ML IJ SOLN: 6.2500 mg | INTRAMUSCULAR | Status: DC | PRN

## 2019-07-06 MED ORDER — TIZANIDINE HCL 4 MG PO TABS: 4.0000 mg | ORAL_TABLET | Freq: Every day | ORAL | 1 refills | Status: DC

## 2019-07-06 MED ORDER — FENTANYL CITRATE (PF) 100 MCG/2ML IJ SOLN
50.0000 ug | INTRAMUSCULAR | Status: DC | PRN
  Administered 2019-07-06: 100 ug via INTRAVENOUS
  Administered 2019-07-06: 12:00:00 50 ug via INTRAVENOUS

## 2019-07-06 MED ORDER — FENTANYL CITRATE (PF) 100 MCG/2ML IJ SOLN
INTRAMUSCULAR | Status: AC
  Filled 2019-07-06: qty 2

## 2019-07-06 MED ORDER — ONDANSETRON HCL 4 MG/2ML IJ SOLN: 4.0000 mg | Freq: Once | INTRAMUSCULAR | Status: DC | PRN

## 2019-07-06 MED ORDER — BUPIVACAINE-EPINEPHRINE (PF) 0.5% -1:200000 IJ SOLN
INTRAMUSCULAR | Status: AC
  Filled 2019-07-06: qty 30

## 2019-07-06 MED ORDER — PROPOFOL 10 MG/ML IV BOLUS
INTRAVENOUS | Status: DC | PRN
  Administered 2019-07-06: 200 mg via INTRAVENOUS

## 2019-07-06 MED ORDER — CLINDAMYCIN PHOSPHATE 600 MG/50ML IV SOLN
INTRAVENOUS | Status: AC
  Filled 2019-07-06: qty 50

## 2019-07-06 MED ORDER — OXYCODONE HCL 5 MG PO TABS
5.0000 mg | ORAL_TABLET | ORAL | Status: DC | PRN
  Administered 2019-07-06 – 2019-07-07 (×3): 10 mg via ORAL
  Filled 2019-07-06 (×3): qty 2

## 2019-07-06 MED ORDER — SORBITOL 70 % SOLN: 30.0000 mL | Freq: Every day | Status: DC | PRN

## 2019-07-06 MED ORDER — MIDAZOLAM HCL 2 MG/2ML IJ SOLN: 1.0000 mg | INTRAMUSCULAR | Status: DC | PRN

## 2019-07-06 MED ORDER — HYDROMORPHONE HCL 1 MG/ML IJ SOLN: 0.5000 mg | INTRAMUSCULAR | Status: DC | PRN

## 2019-07-06 MED ORDER — FENTANYL CITRATE (PF) 100 MCG/2ML IJ SOLN: 25.0000 ug | INTRAMUSCULAR | Status: DC | PRN

## 2019-07-06 MED ORDER — PANTOPRAZOLE SODIUM 20 MG PO TBEC: 20.0000 mg | DELAYED_RELEASE_TABLET | Freq: Every day | ORAL | Status: DC

## 2019-07-06 MED ORDER — KETOROLAC TROMETHAMINE 30 MG/ML IJ SOLN: 15.0000 mg | Freq: Once | INTRAMUSCULAR | Status: DC | PRN

## 2019-07-06 MED ORDER — METOCLOPRAMIDE HCL 5 MG/ML IJ SOLN: 5.0000 mg | Freq: Three times a day (TID) | INTRAMUSCULAR | Status: DC | PRN

## 2019-07-06 MED ORDER — DEXAMETHASONE SODIUM PHOSPHATE 4 MG/ML IJ SOLN
INTRAMUSCULAR | Status: DC | PRN
  Administered 2019-07-06: 4 mg via INTRAVENOUS

## 2019-07-06 MED ORDER — BUPIVACAINE HCL (PF) 0.5 % IJ SOLN
INTRAMUSCULAR | Status: DC | PRN
  Administered 2019-07-06: 10 mL via PERINEURAL

## 2019-07-06 MED ORDER — EPHEDRINE SULFATE 50 MG/ML IJ SOLN
INTRAMUSCULAR | Status: DC | PRN
  Administered 2019-07-06: 10 mg via INTRAVENOUS
  Administered 2019-07-06: 20 mg via INTRAVENOUS
  Administered 2019-07-06 (×2): 10 mg via INTRAVENOUS

## 2019-07-06 MED ORDER — METOCLOPRAMIDE HCL 5 MG PO TABS: 5.0000 mg | ORAL_TABLET | Freq: Three times a day (TID) | ORAL | Status: DC | PRN

## 2019-07-06 MED ORDER — FLEET ENEMA 7-19 GM/118ML RE ENEM: 1.0000 | ENEMA | Freq: Once | RECTAL | Status: DC | PRN

## 2019-07-06 MED ORDER — PROPOFOL 500 MG/50ML IV EMUL
INTRAVENOUS | Status: DC | PRN
  Administered 2019-07-06: 25 ug/kg/min via INTRAVENOUS

## 2019-07-06 MED ORDER — SODIUM CHLORIDE 0.9 % IR SOLN
Status: DC | PRN
  Administered 2019-07-06: 1

## 2019-07-06 MED ORDER — CELECOXIB 200 MG PO CAPS: 200.0000 mg | ORAL_CAPSULE | Freq: Every day | ORAL | Status: DC

## 2019-07-06 MED ORDER — SENNOSIDES-DOCUSATE SODIUM 8.6-50 MG PO TABS: 1.0000 | ORAL_TABLET | Freq: Every evening | ORAL | Status: DC | PRN

## 2019-07-06 MED ORDER — SUCCINYLCHOLINE CHLORIDE 20 MG/ML IJ SOLN
INTRAMUSCULAR | Status: DC | PRN
  Administered 2019-07-06: 100 mg via INTRAVENOUS

## 2019-07-06 MED ORDER — DOCUSATE SODIUM 100 MG PO CAPS
100.0000 mg | ORAL_CAPSULE | Freq: Two times a day (BID) | ORAL | Status: DC
  Filled 2019-07-06: qty 1

## 2019-07-06 MED ORDER — VANCOMYCIN HCL IN DEXTROSE 1-5 GM/200ML-% IV SOLN
INTRAVENOUS | Status: AC
  Filled 2019-07-06: qty 200

## 2019-07-06 MED ORDER — LOSARTAN POTASSIUM 50 MG PO TABS: 50.0000 mg | ORAL_TABLET | Freq: Two times a day (BID) | ORAL | Status: DC

## 2019-07-06 MED ORDER — AMIODARONE HCL 100 MG PO TABS: 100.0000 mg | ORAL_TABLET | Freq: Every day | ORAL | Status: DC

## 2019-07-06 MED ORDER — SCOPOLAMINE 1 MG/3DAYS TD PT72: 1.0000 | MEDICATED_PATCH | Freq: Once | TRANSDERMAL | Status: DC

## 2019-07-06 MED ORDER — OXYCODONE HCL 5 MG/5ML PO SOLN: 5.0000 mg | Freq: Once | ORAL | Status: DC | PRN

## 2019-07-06 SURGICAL SUPPLY — 80 items
AID PSTN UNV HD RSTRNT DISP (MISCELLANEOUS) ×1
ANCH SUT SWLK 19.1X4.75 (Anchor) ×4 IMPLANT
ANCHOR SUT BIO SW 4.75X19.1 (Anchor) ×4 IMPLANT
APL SKNCLS STERI-STRIP NONHPOA (GAUZE/BANDAGES/DRESSINGS) ×1
BENZOIN TINCTURE PRP APPL 2/3 (GAUZE/BANDAGES/DRESSINGS) ×1 IMPLANT
BLADE AVERAGE 25X9 (BLADE) IMPLANT
BLADE SURG 15 STRL LF DISP TIS (BLADE) IMPLANT
BLADE SURG 15 STRL SS (BLADE) ×2
BUR EGG 3PK/BX (BURR) IMPLANT
BUR OVAL 4.0 (BURR) IMPLANT
BURR OVAL 8 FLU 5.0X13 (MISCELLANEOUS) ×2 IMPLANT
CANNULA SHOULDER 7CM (CANNULA) ×2 IMPLANT
CANNULA TWIST IN 8.25X7CM (CANNULA) IMPLANT
CLEANER CAUTERY TIP 5X5 PAD (MISCELLANEOUS) IMPLANT
COVER WAND RF STERILE (DRAPES) IMPLANT
DECANTER SPIKE VIAL GLASS SM (MISCELLANEOUS) IMPLANT
DISSECTOR  3.8MM X 13CM (MISCELLANEOUS) ×1
DISSECTOR 3.8MM X 13CM (MISCELLANEOUS) IMPLANT
DISSECTOR 4.0MM X 13CM (MISCELLANEOUS) IMPLANT
DRAPE SPLIT 6X30 W/TAPE (DRAPES) ×4 IMPLANT
DRAPE STERI 35X30 U-POUCH (DRAPES) ×2 IMPLANT
DRAPE SURG 17X23 STRL (DRAPES) ×2 IMPLANT
DRSG EMULSION OIL 3X3 NADH (GAUZE/BANDAGES/DRESSINGS) ×2 IMPLANT
DRSG MEPILEX BORDER 4X4 (GAUZE/BANDAGES/DRESSINGS) ×1 IMPLANT
DRSG PAD ABDOMINAL 8X10 ST (GAUZE/BANDAGES/DRESSINGS) ×2 IMPLANT
DURAPREP 26ML APPLICATOR (WOUND CARE) ×2 IMPLANT
ELECT REM PT RETURN 9FT ADLT (ELECTROSURGICAL) ×2
ELECTRODE REM PT RTRN 9FT ADLT (ELECTROSURGICAL) ×1 IMPLANT
GAUZE SPONGE 4X4 12PLY STRL (GAUZE/BANDAGES/DRESSINGS) ×2 IMPLANT
GLOVE BIO SURGEON STRL SZ7.5 (GLOVE) ×2 IMPLANT
GLOVE BIOGEL PI IND STRL 7.0 (GLOVE) IMPLANT
GLOVE BIOGEL PI IND STRL 8 (GLOVE) ×2 IMPLANT
GLOVE BIOGEL PI INDICATOR 7.0 (GLOVE) ×2
GLOVE BIOGEL PI INDICATOR 8 (GLOVE) ×2
GLOVE ECLIPSE 6.5 STRL STRAW (GLOVE) ×1 IMPLANT
GLOVE SURG ORTHO 8.0 STRL STRW (GLOVE) ×2 IMPLANT
GOWN STRL REUS W/ TWL LRG LVL3 (GOWN DISPOSABLE) ×1 IMPLANT
GOWN STRL REUS W/ TWL XL LVL3 (GOWN DISPOSABLE) ×1 IMPLANT
GOWN STRL REUS W/TWL LRG LVL3 (GOWN DISPOSABLE) ×2
GOWN STRL REUS W/TWL XL LVL3 (GOWN DISPOSABLE) ×4 IMPLANT
MANIFOLD NEPTUNE II (INSTRUMENTS) ×2 IMPLANT
NDL 1/2 CIR CATGUT .05X1.09 (NEEDLE) IMPLANT
NDL SCORPION MULTI FIRE (NEEDLE) IMPLANT
NEEDLE 1/2 CIR CATGUT .05X1.09 (NEEDLE) IMPLANT
NEEDLE SCORPION MULTI FIRE (NEEDLE) ×2 IMPLANT
NS IRRIG 1000ML POUR BTL (IV SOLUTION) ×2 IMPLANT
PACK ARTHROSCOPY DSU (CUSTOM PROCEDURE TRAY) ×2 IMPLANT
PACK BASIN DAY SURGERY FS (CUSTOM PROCEDURE TRAY) ×2 IMPLANT
PAD CLEANER CAUTERY TIP 5X5 (MISCELLANEOUS) ×1
PAD ORTHO SHOULDER 7X19 LRG (SOFTGOODS) ×1 IMPLANT
PENCIL BUTTON HOLSTER BLD 10FT (ELECTRODE) ×1 IMPLANT
PORT APPOLLO RF 90DEGREE MULTI (SURGICAL WAND) ×1 IMPLANT
RESTRAINT HEAD UNIVERSAL NS (MISCELLANEOUS) ×2 IMPLANT
SLEEVE SCD COMPRESS KNEE MED (MISCELLANEOUS) ×2 IMPLANT
SLING ARM FOAM STRAP LRG (SOFTGOODS) IMPLANT
SLING ARM MED ADULT FOAM STRAP (SOFTGOODS) IMPLANT
SLING ULTRA II MEDIUM (SOFTGOODS) IMPLANT
SLING ULTRA II SMALL (SOFTGOODS) IMPLANT
SPONGE LAP 4X18 RFD (DISPOSABLE) ×1 IMPLANT
STRIP CLOSURE SKIN 1/2X4 (GAUZE/BANDAGES/DRESSINGS) ×1 IMPLANT
SUCTION FRAZIER HANDLE 10FR (MISCELLANEOUS) ×1
SUCTION TUBE FRAZIER 10FR DISP (MISCELLANEOUS) IMPLANT
SUT BONE WAX W31G (SUTURE) ×1 IMPLANT
SUT ETHILON 3 0 PS 1 (SUTURE) ×1 IMPLANT
SUT FIBERWIRE #2 38 T-5 BLUE (SUTURE) ×2
SUT MNCRL AB 3-0 PS2 18 (SUTURE) ×1 IMPLANT
SUT TICRON 1 T 12 (SUTURE) ×1 IMPLANT
SUT TIGER TAPE 7 IN WHITE (SUTURE) ×2 IMPLANT
SUT VIC AB 0 CT1 27 (SUTURE) ×2
SUT VIC AB 0 CT1 27XBRD ANBCTR (SUTURE) IMPLANT
SUT VIC AB 1 CT1 27 (SUTURE)
SUT VIC AB 1 CT1 27XBRD ANBCTR (SUTURE) IMPLANT
SUT VIC AB 2-0 SH 27 (SUTURE) ×2
SUT VIC AB 2-0 SH 27XBRD (SUTURE) IMPLANT
SUTURE FIBERWR #2 38 T-5 BLUE (SUTURE) IMPLANT
TAPE FIBER 2MM 7IN #2 BLUE (SUTURE) ×2 IMPLANT
TOWEL GREEN STERILE FF (TOWEL DISPOSABLE) ×2 IMPLANT
TUBING ARTHROSCOPY IRRIG 16FT (MISCELLANEOUS) ×2 IMPLANT
WATER STERILE IRR 1000ML POUR (IV SOLUTION) ×2 IMPLANT
YANKAUER SUCT BULB TIP NO VENT (SUCTIONS) ×1 IMPLANT

## 2019-07-06 NOTE — Anesthesia Procedure Notes (Addendum)
Anesthesia Regional Block: Interscalene brachial plexus block   Pre-Anesthetic Checklist: ,, timeout performed, Correct Patient, Correct Site, Correct Laterality, Correct Procedure, Correct Position, site marked, Risks and benefits discussed,  Surgical consent,  Pre-op evaluation,  At surgeon's request and post-op pain management  Laterality: Right  Prep: chloraprep       Needles:  Injection technique: Single-shot  Needle Type: Stimiplex          Additional Needles:   Procedures:,,,, ultrasound used (permanent image in chart),,,,  Narrative:  Start time: 07/06/2019 10:30 AM End time: 07/06/2019 10:40 AM  Performed by: Personally   Additional Notes: Patient tolerated the procedure well without complications

## 2019-07-06 NOTE — Discharge Instructions (Signed)
Diet: As you were doing prior to hospitalization   Activity: Increase activity slowly as tolerated  No lifting or driving for 6 weeks   Sling:  May remove sling for shower (keep arm close to body) and pendulum exercises.  Shower: May shower without a dressing on post op day #3, NO SOAKING in tub   Dressing: You may change your dressing on post op day #3.  Then change the dressing daily with sterile 4"x4"s gauze dressing   Weight Bearing: nonweight bearing operative arm.  To prevent constipation: you may use a stool softener such as -  Colace ( over the counter) 100 mg by mouth twice a day  Drink plenty of fluids ( prune juice may be helpful) and high fiber foods  Miralax ( over the counter) for constipation as needed.   Precautions: If you experience chest pain or shortness of breath - call 911 immediately For transfer to the hospital emergency department!!  If you develop a fever greater that 101 F, purulent drainage from wound, increased redness or drainage from wound, or calf pain -- Call the office   Follow- Up Appointment: Please call for an appointment to be seen in 1 week or as previously scheduled [place] - [phone]  Regional Anesthesia Blocks  1. Numbness or the inability to move the "blocked" extremity may last from 3-48 hours after placement. The length of time depends on the medication injected and your individual response to the medication. If the numbness is not going away after 48 hours, call your surgeon.  2. The extremity that is blocked will need to be protected until the numbness is gone and the  Strength has returned. Because you cannot feel it, you will need to take extra care to avoid injury. Because it may be weak, you may have difficulty moving it or using it. You may not know what position it is in without looking at it while the block is in effect.  3. For blocks in the legs and feet, returning to weight bearing and walking needs to be done carefully.  You will need to wait until the numbness is entirely gone and the strength has returned. You should be able to move your leg and foot normally before you try and bear weight or walk. You will need someone to be with you when you first try to ensure you do not fall and possibly risk injury.  4. Bruising and tenderness at the needle site are common side effects and will resolve in a few days.  5. Persistent numbness or new problems with movement should be communicated to the surgeon or the [facility] [phone] [place] [phone]).  Information for Discharge Teaching: EXPAREL (bupivacaine liposome injectable suspension)   Your surgeon or anesthesiologist gave you EXPAREL(bupivacaine) to help control your pain after surgery.   EXPAREL is a local anesthetic that provides pain relief by numbing the tissue around the surgical site.  EXPAREL is designed to release pain medication over time and can control pain for up to 72 hours.  Depending on how you respond to EXPAREL, you may require less pain medication during your recovery.  Possible side effects:  Temporary loss of sensation or ability to move in the area where bupivacaine was injected.  Nausea, vomiting, constipation  Rarely, numbness and tingling in your mouth or lips, lightheadedness, or anxiety may occur.  Call your doctor right away if you think you may be experiencing any of these sensations, or if you  have other questions regarding possible side effects.  Follow all other discharge instructions given to you by your surgeon or nurse. Eat a healthy diet and drink plenty of water or other fluids.  If you return to the hospital for any reason within 96 hours following the administration of EXPAREL, it is important for health care providers to know that you have received this anesthetic. A teal colored band has been placed on your arm with the date, time and amount of EXPAREL you have received in  order to alert and inform your health care providers. Please leave this armband in place for the full 96 hours following administration, and then you may remove the band.

## 2019-07-06 NOTE — Anesthesia Postprocedure Evaluation (Signed)
Anesthesia Post Note  Patient: JERELD PRESTI  Procedure(s) Performed: SHOULDER ARTHROSCOPY WITH OPEN ROTATOR CUFF REPAIR, SUBACROMIAL DECOMPRESSION WITH EXTENSIVE DEBRIDEMENT (Right Shoulder) OPEN DISTAL CLAVICLE EXCISION (Right Shoulder)     Patient location during evaluation: PACU Anesthesia Type: General Level of consciousness: awake and alert Pain management: pain level controlled Vital Signs Assessment: post-procedure vital signs reviewed and stable Respiratory status: spontaneous breathing, nonlabored ventilation and respiratory function stable Cardiovascular status: blood pressure returned to baseline and stable Postop Assessment: no apparent nausea or vomiting Anesthetic complications: no    Last Vitals:  Vitals:   07/06/19 1445 07/06/19 1500  BP: 136/77 133/76  Pulse: 74 67  Resp: 19 18  Temp:  (!) 36.4 C  SpO2: 95% 95%    Last Pain:  Vitals:   07/06/19 1500  TempSrc:   PainSc: 0-No pain                 Lidia Collum

## 2019-07-06 NOTE — Anesthesia Preprocedure Evaluation (Signed)
Anesthesia Evaluation  Patient identified by MRN, date of birth, ID band Patient awake    Reviewed: Allergy & Precautions, NPO status , Patient's Chart, lab work & pertinent test results, reviewed documented beta blocker date and time   History of Anesthesia Complications Negative for: history of anesthetic complications  Airway Mallampati: I  TM Distance: >3 FB Neck ROM: Full    Dental  (+) Teeth Intact, Dental Advisory Given   Pulmonary sleep apnea and Continuous Positive Airway Pressure Ventilation , former smoker,    Pulmonary exam normal breath sounds clear to auscultation       Cardiovascular hypertension, Pt. on medications and Pt. on home beta blockers + CAD  + dysrhythmias Atrial Fibrillation  Rhythm:Irregular Rate:Abnormal     Neuro/Psych PSYCHIATRIC DISORDERS Depression negative neurological ROS     GI/Hepatic Neg liver ROS, GERD  ,  Endo/Other  negative endocrine ROS  Renal/GU Renal disease     Musculoskeletal  (+) Arthritis , Rheumatoid disorders,    Abdominal   Peds  Hematology  (+) Blood dyscrasia, ,   Anesthesia Other Findings Day of surgery medications reviewed with the patient.  Reproductive/Obstetrics                             Anesthesia Physical  Anesthesia Plan  ASA: III  Anesthesia Plan: General   Post-op Pain Management:    Induction: Intravenous  PONV Risk Score and Plan: 3 and Ondansetron, Dexamethasone and Treatment may vary due to age or medical condition  Airway Management Planned: Oral ETT  Additional Equipment: None  Intra-op Plan:   Post-operative Plan: Extubation in OR  Informed Consent: I have reviewed the patients History and Physical, chart, labs and discussed the procedure including the risks, benefits and alternatives for the proposed anesthesia with the patient or authorized representative who has indicated his/her understanding and  acceptance.     Dental advisory given  Plan Discussed with: CRNA  Anesthesia Plan Comments:         Anesthesia Quick Evaluation

## 2019-07-06 NOTE — Op Note (Signed)
NAME: Nathan Mcclure, Nathan Mcclure MEDICAL RECORD IH:[id number] ACCOUNT [id number] DATE OF BIRTH:[birth year] FACILITY: [facility] LOCATION: [facility] PHYSICIAN:W. Yesmin Mutch JR., MD  OPERATIVE REPORT  DATE OF PROCEDURE:  07/06/2019  PREOPERATIVE DIAGNOSES: 1.  Large rotator cuff tear (full with supraspinatus partial with infraspinatus) and these were all regarding the right shoulder. 2.  Impingement. 3.  Acromioclavicular  joint arthritis. 4.  Degenerative tearing anterior superior labrum.  POSTOPERATIVE DIAGNOSES: 1.  Large rotator cuff tear (full with supraspinatus partial with infraspinatus) and these were all regarding the right shoulder. 2.  Impingement. 3.  Acromioclavicular joint arthritis. 4.  Degenerative tearing anterior superior labrum.  OPERATION: 1.  Open rotator cuff repair, acromioplasty (chronic). 2.  Open distal clavicle. 3.  Arthroscopic debridement intra-articular torn labrum.  SURGEON:  Vangie Bicker, MD  ASSISTANT:  Jennet Maduro, PA   ANESTHESIA:  Block with general anesthetic.  ESTIMATED BLOOD LOSS:  Minimal.  DESCRIPTION OF PROCEDURE:  Beach chair positioning, arthroscopic portals created anteriorly and posteriorly.  Systematic inspection of the shoulder showed the patient to have a with full thickness tear of the supraspinatus with a leading edge tear of the  infraspinatus with probably a about 20-30% of the width of the infraspinatus.  He had no significant glenohumeral degenerative change, but a moderately severe degenerative labral tear.  The anterior superior labrum was debrided.  Once we debrided the  labrum freshened the edges of the cuff with the  arthroscope, we converted the procedure to an open procedure with an incision bisecting the acromial AC interval.  Severe impingement from the edge of the acromion CA ligament was noted.  We performed an  aggressive acromioplasty followed by excessive exercising a very arthritic AC joint, relative to the  distal clavicle about 1 cm to 1.5 cm.  Bursectomy carried out.  We burred  the greater tuberosity and then oversewed the posterior edge of the  infraspinatus with interrupted Ti-Cron back to its native attachment and then did a medial lateral row technique for the remainder of the tear, specifically using 4.75 SwiveLocks over a bur tuberosity for a total of 8 FiberTapes, 2 double arm SwiveLocks  medially and 2 laterally.  This essentially created a watertight repair.  The tear was moderately retracted and there was moderate attenuation of the tissue with a horizontal cleavage component noted particularly in the posterior one half  of the torn  rotator cuff.  Wound was irrigated.  The deltoid been split was reapproximated with #1 Ti-Cron, subcutaneous tissue with 2-0 Vicryl and skin with Monocryl.  A lightly compressive sterile dressing and a mini pillow splint applied.    Taken to recovery room in stable condition.  AN/NUANCE  D:07/06/2019 T:07/06/2019 JOB:[id number]

## 2019-07-06 NOTE — Transfer of Care (Signed)
Immediate Anesthesia Transfer of Care Note  Patient: Nathan Mcclure  Procedure(s) Performed: SHOULDER ARTHROSCOPY WITH OPEN ROTATOR CUFF REPAIR, SUBACROMIAL DECOMPRESSION WITH EXTENSIVE DEBRIDEMENT (Right Shoulder) OPEN DISTAL CLAVICLE EXCISION (Right Shoulder)  Patient Location: PACU  Anesthesia Type:GA combined with regional for post-op pain  Level of Consciousness: sedated  Airway & Oxygen Therapy: Patient Spontanous Breathing and Patient connected to nasal cannula oxygen  Post-op Assessment: Report given to RN and Post -op Vital signs reviewed and stable  Post vital signs: Reviewed and stable  Last Vitals:  Vitals Value Taken Time  BP 144/84 07/06/19 1410  Temp    Pulse 86 07/06/19 1414  Resp 19 07/06/19 1414  SpO2 98 % 07/06/19 1414  Vitals shown include unvalidated device data.  Last Pain:  Vitals:   07/06/19 0905  TempSrc: Oral  PainSc: 7       Patients Stated Pain Goal: 2 (69/86/14 8307)  Complications: No apparent anesthesia complications

## 2019-07-06 NOTE — Anesthesia Procedure Notes (Signed)
Procedure Name: Intubation Date/Time: 07/06/2019 12:13 PM Performed by: Signe Colt, CRNA Pre-anesthesia Checklist: Patient identified, Emergency Drugs available, Suction available and Patient being monitored Patient Re-evaluated:Patient Re-evaluated prior to induction Oxygen Delivery Method: Circle system utilized Preoxygenation: Pre-oxygenation with 100% oxygen Induction Type: IV induction Ventilation: Mask ventilation without difficulty Laryngoscope Size: Mac and 3 Grade View: Grade II Tube type: Oral Tube size: 7.0 mm Number of attempts: 2 Airway Equipment and Method: Stylet,  Oral airway and Video-laryngoscopy Placement Confirmation: ETT inserted through vocal cords under direct vision,  positive ETCO2 and breath sounds checked- equal and bilateral Secured at: 21 cm Tube secured with: Tape Dental Injury: Teeth and Oropharynx as per pre-operative assessment  Comments: Smooth IV induction, easy mask, DL x 1 unable to view cords (on shoulder bed, cervical fusion, rheumatoid arthritis), change to glide scope VC visualized easy atraumatic placement

## 2019-07-06 NOTE — Progress Notes (Signed)
Assisted Dr. Germeroth with right, ultrasound guided, interscalene  block. Side rails up, monitors on throughout procedure. See vital signs in flow sheet. Tolerated Procedure well.  

## 2019-07-06 NOTE — Brief Op Note (Signed)
07/06/2019  2:09 PM  PATIENT:  Nathan Mcclure  68 y.o. male  PRE-OPERATIVE DIAGNOSIS:  RIGHT SHOULDER OSTEOARTHRITIS, ROTATOR CUFF TEAR  POST-OPERATIVE DIAGNOSIS:  RIGHT SHOULDER OSTEOARTHRITIS, ROTATOR CUFF TEAR  PROCEDURE:  Procedure(s): SHOULDER ARTHROSCOPY WITH OPEN ROTATOR CUFF REPAIR, SUBACROMIAL DECOMPRESSION WITH EXTENSIVE DEBRIDEMENT (Right) OPEN DISTAL CLAVICLE EXCISION (Right)  SURGEON:  Surgeon(s) and Role:    Earlie Server, MD - Primary  PHYSICIAN ASSISTANT: Chriss Czar, PA-C  ASSISTANTS:    ANESTHESIA:   regional and general  EBL:  50 mL   BLOOD ADMINISTERED:none  DRAINS: none   LOCAL MEDICATIONS USED:  NONE  SPECIMEN:  No Specimen  DISPOSITION OF SPECIMEN:  N/A  COUNTS:  YES  TOURNIQUET:  * No tourniquets in log *  DICTATION: .Other Dictation: Dictation Number unknown  PLAN OF CARE: Discharge to home after PACU  PATIENT DISPOSITION:  PACU - hemodynamically stable.   Delay start of Pharmacological VTE agent (>24hrs) due to surgical blood loss or risk of bleeding: not applicable

## 2019-07-06 NOTE — Interval H&P Note (Signed)
History and Physical Interval Note:  07/06/2019 11:46 AM  Bryan Lemma  has presented today for surgery, with the diagnosis of RIGHT SHOULDER OSTEOARTHRITIS, ROTATOR CUFF TEAR.  The various methods of treatment have been discussed with the patient and family. After consideration of risks, benefits and other options for treatment, the patient has consented to  Procedure(s) with comments: SHOULDER ARTHROSCOPY WITH OPEN ROTATOR CUFF REPAIR, OPEN DISTAL CLAVICLE EXCISION AND SUBACROMIAL DECOMPRESSION WITH EXTENSIVE DEBRIDEMENT (Right) - PRE/POST SCALENE as a surgical intervention.  The patient's history has been reviewed, patient examined, no change in status, stable for surgery.  I have reviewed the patient's chart and labs.  Questions were answered to the patient's satisfaction.     Yvette Rack

## 2019-07-07 ENCOUNTER — Encounter (HOSPITAL_BASED_OUTPATIENT_CLINIC_OR_DEPARTMENT_OTHER): Payer: Self-pay | Admitting: Orthopedic Surgery

## 2019-07-07 DIAGNOSIS — M069 Rheumatoid arthritis, unspecified: Secondary | ICD-10-CM | POA: Diagnosis not present

## 2019-07-07 DIAGNOSIS — I2510 Atherosclerotic heart disease of native coronary artery without angina pectoris: Secondary | ICD-10-CM | POA: Diagnosis not present

## 2019-07-07 DIAGNOSIS — S43431A Superior glenoid labrum lesion of right shoulder, initial encounter: Secondary | ICD-10-CM | POA: Diagnosis not present

## 2019-07-07 DIAGNOSIS — M75121 Complete rotator cuff tear or rupture of right shoulder, not specified as traumatic: Secondary | ICD-10-CM | POA: Diagnosis not present

## 2019-07-07 DIAGNOSIS — F419 Anxiety disorder, unspecified: Secondary | ICD-10-CM | POA: Diagnosis not present

## 2019-07-07 DIAGNOSIS — I10 Essential (primary) hypertension: Secondary | ICD-10-CM | POA: Diagnosis not present

## 2019-07-07 DIAGNOSIS — M7541 Impingement syndrome of right shoulder: Secondary | ICD-10-CM | POA: Diagnosis not present

## 2019-07-07 DIAGNOSIS — D759 Disease of blood and blood-forming organs, unspecified: Secondary | ICD-10-CM | POA: Diagnosis not present

## 2019-07-07 DIAGNOSIS — F329 Major depressive disorder, single episode, unspecified: Secondary | ICD-10-CM | POA: Diagnosis not present

## 2019-07-07 DIAGNOSIS — E785 Hyperlipidemia, unspecified: Secondary | ICD-10-CM | POA: Diagnosis not present

## 2019-07-07 DIAGNOSIS — Z7901 Long term (current) use of anticoagulants: Secondary | ICD-10-CM | POA: Diagnosis not present

## 2019-07-07 DIAGNOSIS — G4730 Sleep apnea, unspecified: Secondary | ICD-10-CM | POA: Diagnosis not present

## 2019-07-07 DIAGNOSIS — M19011 Primary osteoarthritis, right shoulder: Secondary | ICD-10-CM | POA: Diagnosis not present

## 2019-07-07 DIAGNOSIS — Z87891 Personal history of nicotine dependence: Secondary | ICD-10-CM | POA: Diagnosis not present

## 2019-07-07 DIAGNOSIS — Z79899 Other long term (current) drug therapy: Secondary | ICD-10-CM | POA: Diagnosis not present

## 2019-07-07 DIAGNOSIS — K219 Gastro-esophageal reflux disease without esophagitis: Secondary | ICD-10-CM | POA: Diagnosis not present

## 2019-07-07 MED ORDER — DEXAMETHASONE SODIUM PHOSPHATE 10 MG/ML IJ SOLN
INTRAMUSCULAR | Status: AC
  Filled 2019-07-07: qty 4

## 2019-07-07 MED ORDER — SUCCINYLCHOLINE CHLORIDE 200 MG/10ML IV SOSY
PREFILLED_SYRINGE | INTRAVENOUS | Status: AC
  Filled 2019-07-07: qty 10

## 2019-07-07 MED ORDER — LIDOCAINE 2% (20 MG/ML) 5 ML SYRINGE
INTRAMUSCULAR | Status: AC
  Filled 2019-07-07: qty 20

## 2019-07-07 MED ORDER — ONDANSETRON HCL 4 MG/2ML IJ SOLN
INTRAMUSCULAR | Status: AC
  Filled 2019-07-07: qty 16

## 2019-07-07 MED ORDER — CLINDAMYCIN PHOSPHATE 600 MG/50ML IV SOLN
INTRAVENOUS | Status: AC
  Filled 2019-07-07: qty 50

## 2019-07-07 MED ORDER — EPHEDRINE 5 MG/ML INJ
INTRAVENOUS | Status: AC
  Filled 2019-07-07: qty 30

## 2019-07-07 MED ORDER — PROPOFOL 500 MG/50ML IV EMUL
INTRAVENOUS | Status: AC
  Filled 2019-07-07: qty 150

## 2019-07-07 NOTE — Discharge Summary (Signed)
PATIENT ID: Nathan Mcclure        MRN:  [id number]          DOB/AGE: 31-Dec-1950 / 68 y.o.    DISCHARGE SUMMARY  ADMISSION DATE:    07/06/2019 DISCHARGE DATE:   07/07/2019   ADMISSION DIAGNOSIS: RIGHT SHOULDER OSTEOARTHRITIS, ROTATOR CUFF TEAR    DISCHARGE DIAGNOSIS:  RIGHT SHOULDER OSTEOARTHRITIS, ROTATOR CUFF TEAR    ADDITIONAL DIAGNOSIS: Active Problems:   * No active hospital problems. *  Past Medical History:  Diagnosis Date  . Anxiety   . Arthritis   . Atrial fibrillation ([place])   . Coronary artery disease    history of Atrial Fib.  . Depression   . GERD (gastroesophageal reflux disease)   . History of kidney stones   . Hyperlipidemia   . Hypertension   . kidney stone   . Low testosterone   . Palpitations   . Rheumatoid arthritis ([place])   . Sleep apnea    has CPAP but not worn in 2 mos    PROCEDURE: Procedure(s): SHOULDER ARTHROSCOPY WITH OPEN ROTATOR CUFF REPAIR, SUBACROMIAL DECOMPRESSION WITH EXTENSIVE DEBRIDEMENT OPEN DISTAL CLAVICLE EXCISION Right on 07/06/2019  CONSULTS: none    HISTORY:  See H&P in chart  HOSPITAL COURSE:  Nathan Mcclure is a 68 y.o. admitted on 07/06/2019 and found to have a diagnosis of [place].  After appropriate laboratory studies were obtained  they were taken to the operating room on 07/06/2019 and underwent  Procedure(s): SHOULDER ARTHROSCOPY WITH OPEN ROTATOR CUFF REPAIR, SUBACROMIAL DECOMPRESSION WITH EXTENSIVE DEBRIDEMENT OPEN DISTAL CLAVICLE EXCISION  Right.   They were given perioperative antibiotics:  Anti-infectives (From admission, onward)   Start     Dose/Rate Route Frequency Ordered Stop   07/06/19 1500  clindamycin (CLEOCIN) IVPB 600 mg     600 mg 100 mL/hr over 30 Minutes Intravenous Every 6 hours 07/06/19 1457 07/07/19 0545   07/06/19 0915  vancomycin (VANCOCIN) IVPB 1000 mg/200 mL premix     1,000 mg 200 mL/hr over 60 Minutes Intravenous On call to O.R. 07/06/19 0910  07/06/19 1144    .  Tolerated the procedure well. Patient was kept overnight for post op pain control, sleep apnea, and no assistance at home.     The patient was discharged on 1 Day Post-Op in  Good condition.  Blood products given:none  DIAGNOSTIC STUDIES: Recent vital signs:  Patient Vitals for the past 24 hrs:  BP Temp Pulse Resp SpO2  07/07/19 0600 135/78 (!) 97.5 F (36.4 C) 84 18 95 %  07/07/19 0200 123/64 - 80 18 95 %  07/06/19 2155 135/84 98.4 F (36.9 C) (!) 103 18 94 %  07/06/19 1800 138/80 - 94 18 94 %       Recent laboratory studies: No results for input(s): WBC, HGB, HCT, PLT in the last 168 hours. No results for input(s): NA, K, CL, CO2, BUN, CREATININE, GLUCOSE, CALCIUM in the last 168 hours. Lab Results  Component Value Date   INR 1.24 02/13/2016   INR 1.30 11/15/2014     Recent Radiographic Studies :  No results found.  DISCHARGE INSTRUCTIONS:   DISCHARGE MEDICATIONS:   Allergies as of 07/07/2019      Reactions   Penicillins       Medication List    STOP taking these medications   celecoxib 200 MG capsule Commonly known as: CELEBREX     TAKE these medications   acetaminophen 500  MG tablet Commonly known as: TYLENOL Take 1,000 mg by mouth every 6 (six) hours as needed.   amiodarone 200 MG tablet Commonly known as: PACERONE Take 0.5 tablets (100 mg total) by mouth daily.   Enbrel SureClick 50 MG/ML injection Generic drug: etanercept   fexofenadine 180 MG tablet Commonly known as: ALLEGRA Take 180 mg by mouth daily.   fluticasone 50 MCG/ACT nasal spray Commonly known as: FLONASE Place into both nostrils daily.   GLUCOSAMINE CHONDROITIN JOINT PO Take 2 tablets by mouth 2 (two) times daily.   lansoprazole 15 MG capsule Commonly known as: PREVACID Take 15 mg by mouth at bedtime.   losartan 50 MG tablet Commonly known as: COZAAR Take 1 tablet (50 mg total) by mouth 2 (two) times daily.   multivitamin tablet Take 1 tablet by  mouth daily.   oxyCODONE 5 MG immediate release tablet Commonly known as: Oxy IR/ROXICODONE Take one tab po q4-6hrs prn pain, may need 1-2 first couple weeks What changed:   how much to take  how to take this  when to take this  reasons to take this  additional instructions   rivaroxaban 20 MG Tabs tablet Commonly known as: Xarelto Take 1 tablet (20 mg total) by mouth daily with supper.   rosuvastatin 20 MG tablet Commonly known as: CRESTOR Take 1 tablet (20 mg total) by mouth daily.   sertraline 100 MG tablet Commonly known as: ZOLOFT Take 100 mg by mouth daily.   tadalafil 5 MG tablet Commonly known as: CIALIS Take 5 mg by mouth daily as needed for erectile dysfunction.   tiZANidine 4 MG tablet Commonly known as: Zanaflex Take 1 tablet (4 mg total) by mouth daily.       FOLLOW UP VISIT:   Follow-up Information    Earlie Server, MD. Schedule an appointment as soon as possible for a visit in 1 week.   Specialty: Orthopedic Surgery Why: or as previously scheduled Contact information: [address] [place] [address] [phone]           DISPOSITION:   Home  CONDITION:  Veto Kemps, PA-C  07/07/2019 5:52 PM

## 2019-07-12 DIAGNOSIS — M19011 Primary osteoarthritis, right shoulder: Secondary | ICD-10-CM | POA: Diagnosis not present

## 2019-07-28 DIAGNOSIS — M19011 Primary osteoarthritis, right shoulder: Secondary | ICD-10-CM | POA: Diagnosis not present

## 2019-08-04 DIAGNOSIS — M25511 Pain in right shoulder: Secondary | ICD-10-CM | POA: Diagnosis not present

## 2019-08-04 DIAGNOSIS — M6281 Muscle weakness (generalized): Secondary | ICD-10-CM | POA: Diagnosis not present

## 2019-08-04 DIAGNOSIS — M75121 Complete rotator cuff tear or rupture of right shoulder, not specified as traumatic: Secondary | ICD-10-CM | POA: Diagnosis not present

## 2019-08-04 DIAGNOSIS — M25611 Stiffness of right shoulder, not elsewhere classified: Secondary | ICD-10-CM | POA: Diagnosis not present

## 2019-08-08 DIAGNOSIS — M25611 Stiffness of right shoulder, not elsewhere classified: Secondary | ICD-10-CM | POA: Diagnosis not present

## 2019-08-08 DIAGNOSIS — M25511 Pain in right shoulder: Secondary | ICD-10-CM | POA: Diagnosis not present

## 2019-08-08 DIAGNOSIS — M75121 Complete rotator cuff tear or rupture of right shoulder, not specified as traumatic: Secondary | ICD-10-CM | POA: Diagnosis not present

## 2019-08-08 DIAGNOSIS — M6281 Muscle weakness (generalized): Secondary | ICD-10-CM | POA: Diagnosis not present

## 2019-08-10 DIAGNOSIS — M25511 Pain in right shoulder: Secondary | ICD-10-CM | POA: Diagnosis not present

## 2019-08-10 DIAGNOSIS — M6281 Muscle weakness (generalized): Secondary | ICD-10-CM | POA: Diagnosis not present

## 2019-08-10 DIAGNOSIS — M75121 Complete rotator cuff tear or rupture of right shoulder, not specified as traumatic: Secondary | ICD-10-CM | POA: Diagnosis not present

## 2019-08-10 DIAGNOSIS — M25611 Stiffness of right shoulder, not elsewhere classified: Secondary | ICD-10-CM | POA: Diagnosis not present

## 2019-08-15 DIAGNOSIS — M25511 Pain in right shoulder: Secondary | ICD-10-CM | POA: Diagnosis not present

## 2019-08-15 DIAGNOSIS — M25611 Stiffness of right shoulder, not elsewhere classified: Secondary | ICD-10-CM | POA: Diagnosis not present

## 2019-08-15 DIAGNOSIS — M6281 Muscle weakness (generalized): Secondary | ICD-10-CM | POA: Diagnosis not present

## 2019-08-15 DIAGNOSIS — M75121 Complete rotator cuff tear or rupture of right shoulder, not specified as traumatic: Secondary | ICD-10-CM | POA: Diagnosis not present

## 2019-08-16 DIAGNOSIS — M150 Primary generalized (osteo)arthritis: Secondary | ICD-10-CM | POA: Diagnosis not present

## 2019-08-16 DIAGNOSIS — M2550 Pain in unspecified joint: Secondary | ICD-10-CM | POA: Diagnosis not present

## 2019-08-16 DIAGNOSIS — Z79899 Other long term (current) drug therapy: Secondary | ICD-10-CM | POA: Diagnosis not present

## 2019-08-16 DIAGNOSIS — M059 Rheumatoid arthritis with rheumatoid factor, unspecified: Secondary | ICD-10-CM | POA: Diagnosis not present

## 2019-08-17 DIAGNOSIS — M75121 Complete rotator cuff tear or rupture of right shoulder, not specified as traumatic: Secondary | ICD-10-CM | POA: Diagnosis not present

## 2019-08-17 DIAGNOSIS — M25611 Stiffness of right shoulder, not elsewhere classified: Secondary | ICD-10-CM | POA: Diagnosis not present

## 2019-08-17 DIAGNOSIS — M25511 Pain in right shoulder: Secondary | ICD-10-CM | POA: Diagnosis not present

## 2019-08-17 DIAGNOSIS — M6281 Muscle weakness (generalized): Secondary | ICD-10-CM | POA: Diagnosis not present

## 2019-08-18 ENCOUNTER — Other Ambulatory Visit: Payer: Self-pay | Admitting: Cardiovascular Disease

## 2019-08-21 ENCOUNTER — Other Ambulatory Visit: Payer: Self-pay | Admitting: Cardiovascular Disease

## 2019-08-22 DIAGNOSIS — M6281 Muscle weakness (generalized): Secondary | ICD-10-CM | POA: Diagnosis not present

## 2019-08-22 DIAGNOSIS — M25511 Pain in right shoulder: Secondary | ICD-10-CM | POA: Diagnosis not present

## 2019-08-22 DIAGNOSIS — M25611 Stiffness of right shoulder, not elsewhere classified: Secondary | ICD-10-CM | POA: Diagnosis not present

## 2019-08-22 DIAGNOSIS — M75121 Complete rotator cuff tear or rupture of right shoulder, not specified as traumatic: Secondary | ICD-10-CM | POA: Diagnosis not present

## 2019-08-24 DIAGNOSIS — M75121 Complete rotator cuff tear or rupture of right shoulder, not specified as traumatic: Secondary | ICD-10-CM | POA: Diagnosis not present

## 2019-08-24 DIAGNOSIS — M25611 Stiffness of right shoulder, not elsewhere classified: Secondary | ICD-10-CM | POA: Diagnosis not present

## 2019-08-24 DIAGNOSIS — M6281 Muscle weakness (generalized): Secondary | ICD-10-CM | POA: Diagnosis not present

## 2019-08-24 DIAGNOSIS — M25511 Pain in right shoulder: Secondary | ICD-10-CM | POA: Diagnosis not present

## 2019-08-30 DIAGNOSIS — M25611 Stiffness of right shoulder, not elsewhere classified: Secondary | ICD-10-CM | POA: Diagnosis not present

## 2019-08-30 DIAGNOSIS — M75121 Complete rotator cuff tear or rupture of right shoulder, not specified as traumatic: Secondary | ICD-10-CM | POA: Diagnosis not present

## 2019-08-30 DIAGNOSIS — M6281 Muscle weakness (generalized): Secondary | ICD-10-CM | POA: Diagnosis not present

## 2019-08-30 DIAGNOSIS — M25511 Pain in right shoulder: Secondary | ICD-10-CM | POA: Diagnosis not present

## 2019-09-01 DIAGNOSIS — M25611 Stiffness of right shoulder, not elsewhere classified: Secondary | ICD-10-CM | POA: Diagnosis not present

## 2019-09-01 DIAGNOSIS — M6281 Muscle weakness (generalized): Secondary | ICD-10-CM | POA: Diagnosis not present

## 2019-09-01 DIAGNOSIS — M75121 Complete rotator cuff tear or rupture of right shoulder, not specified as traumatic: Secondary | ICD-10-CM | POA: Diagnosis not present

## 2019-09-01 DIAGNOSIS — M25511 Pain in right shoulder: Secondary | ICD-10-CM | POA: Diagnosis not present

## 2019-09-06 DIAGNOSIS — M25611 Stiffness of right shoulder, not elsewhere classified: Secondary | ICD-10-CM | POA: Diagnosis not present

## 2019-09-06 DIAGNOSIS — M6281 Muscle weakness (generalized): Secondary | ICD-10-CM | POA: Diagnosis not present

## 2019-09-06 DIAGNOSIS — M75121 Complete rotator cuff tear or rupture of right shoulder, not specified as traumatic: Secondary | ICD-10-CM | POA: Diagnosis not present

## 2019-09-06 DIAGNOSIS — M25511 Pain in right shoulder: Secondary | ICD-10-CM | POA: Diagnosis not present

## 2019-09-08 DIAGNOSIS — M25611 Stiffness of right shoulder, not elsewhere classified: Secondary | ICD-10-CM | POA: Diagnosis not present

## 2019-09-08 DIAGNOSIS — M25511 Pain in right shoulder: Secondary | ICD-10-CM | POA: Diagnosis not present

## 2019-09-08 DIAGNOSIS — M75121 Complete rotator cuff tear or rupture of right shoulder, not specified as traumatic: Secondary | ICD-10-CM | POA: Diagnosis not present

## 2019-09-08 DIAGNOSIS — M6281 Muscle weakness (generalized): Secondary | ICD-10-CM | POA: Diagnosis not present

## 2019-09-08 DIAGNOSIS — M1712 Unilateral primary osteoarthritis, left knee: Secondary | ICD-10-CM | POA: Diagnosis not present

## 2019-09-12 DIAGNOSIS — M25511 Pain in right shoulder: Secondary | ICD-10-CM | POA: Diagnosis not present

## 2019-09-12 DIAGNOSIS — M75121 Complete rotator cuff tear or rupture of right shoulder, not specified as traumatic: Secondary | ICD-10-CM | POA: Diagnosis not present

## 2019-09-12 DIAGNOSIS — M6281 Muscle weakness (generalized): Secondary | ICD-10-CM | POA: Diagnosis not present

## 2019-09-12 DIAGNOSIS — M25611 Stiffness of right shoulder, not elsewhere classified: Secondary | ICD-10-CM | POA: Diagnosis not present

## 2019-09-14 DIAGNOSIS — M25611 Stiffness of right shoulder, not elsewhere classified: Secondary | ICD-10-CM | POA: Diagnosis not present

## 2019-09-14 DIAGNOSIS — M75121 Complete rotator cuff tear or rupture of right shoulder, not specified as traumatic: Secondary | ICD-10-CM | POA: Diagnosis not present

## 2019-09-14 DIAGNOSIS — M6281 Muscle weakness (generalized): Secondary | ICD-10-CM | POA: Diagnosis not present

## 2019-09-15 DIAGNOSIS — M1712 Unilateral primary osteoarthritis, left knee: Secondary | ICD-10-CM | POA: Diagnosis not present

## 2019-09-21 DIAGNOSIS — M75121 Complete rotator cuff tear or rupture of right shoulder, not specified as traumatic: Secondary | ICD-10-CM | POA: Diagnosis not present

## 2019-09-21 DIAGNOSIS — M6281 Muscle weakness (generalized): Secondary | ICD-10-CM | POA: Diagnosis not present

## 2019-09-21 DIAGNOSIS — M25611 Stiffness of right shoulder, not elsewhere classified: Secondary | ICD-10-CM | POA: Diagnosis not present

## 2019-09-21 DIAGNOSIS — M25511 Pain in right shoulder: Secondary | ICD-10-CM | POA: Diagnosis not present

## 2019-09-22 DIAGNOSIS — M1712 Unilateral primary osteoarthritis, left knee: Secondary | ICD-10-CM | POA: Diagnosis not present

## 2019-10-25 DIAGNOSIS — M25511 Pain in right shoulder: Secondary | ICD-10-CM | POA: Diagnosis not present

## 2019-11-23 DIAGNOSIS — G894 Chronic pain syndrome: Secondary | ICD-10-CM | POA: Diagnosis not present

## 2019-11-23 DIAGNOSIS — Z6831 Body mass index (BMI) 31.0-31.9, adult: Secondary | ICD-10-CM | POA: Diagnosis not present

## 2019-11-23 DIAGNOSIS — I10 Essential (primary) hypertension: Secondary | ICD-10-CM | POA: Diagnosis not present

## 2019-11-23 DIAGNOSIS — M542 Cervicalgia: Secondary | ICD-10-CM | POA: Diagnosis not present

## 2019-11-27 ENCOUNTER — Other Ambulatory Visit: Payer: Self-pay | Admitting: Cardiovascular Disease

## 2019-11-28 ENCOUNTER — Telehealth: Payer: Self-pay

## 2019-11-28 ENCOUNTER — Other Ambulatory Visit: Payer: Self-pay | Admitting: Orthopaedic Surgery

## 2019-11-28 DIAGNOSIS — M542 Cervicalgia: Secondary | ICD-10-CM

## 2019-11-28 NOTE — Telephone Encounter (Signed)
Received refill request.  Left message for patient to contact office to schedule yearly visit with Dr Claiborne Billings.

## 2019-12-01 ENCOUNTER — Ambulatory Visit (INDEPENDENT_AMBULATORY_CARE_PROVIDER_SITE_OTHER): Payer: BC Managed Care – PPO | Admitting: Cardiovascular Disease

## 2019-12-01 ENCOUNTER — Other Ambulatory Visit: Payer: Self-pay

## 2019-12-01 DIAGNOSIS — I2510 Atherosclerotic heart disease of native coronary artery without angina pectoris: Secondary | ICD-10-CM | POA: Diagnosis not present

## 2019-12-01 DIAGNOSIS — Z5181 Encounter for therapeutic drug level monitoring: Secondary | ICD-10-CM

## 2019-12-01 DIAGNOSIS — E785 Hyperlipidemia, unspecified: Secondary | ICD-10-CM | POA: Diagnosis not present

## 2019-12-01 DIAGNOSIS — I480 Paroxysmal atrial fibrillation: Secondary | ICD-10-CM

## 2019-12-01 DIAGNOSIS — G4731 Primary central sleep apnea: Secondary | ICD-10-CM | POA: Diagnosis not present

## 2019-12-01 DIAGNOSIS — I10 Essential (primary) hypertension: Secondary | ICD-10-CM | POA: Diagnosis not present

## 2019-12-01 NOTE — Patient Instructions (Signed)
Medication Instructions:  START USING YOUR CPAP MACHINE  *If you need a refill on your cardiac medications before your next appointment, please call your pharmacy*  Lab Work: FASTING LP/CMET/CBC/TSH SOON   If you have labs (blood work) drawn today and your tests are completely normal, you will receive your results only by: Marland Kitchen MyChart Message (if you have MyChart) OR . A paper copy in the mail If you have any lab test that is abnormal or we need to change your treatment, we will call you to review the results.  Testing/Procedures: NONE   Follow-Up: At [facility], you and your health needs are our priority.  As part of our continuing mission to provide you with exceptional heart care, we have created designated Provider Care Teams.  These Care Teams include your primary Cardiologist (physician) and Advanced Practice Providers (APPs -  Physician Assistants and Nurse Practitioners) who all work together to provide you with the care you need, when you need it.  Your next appointment:   3 month(s)  The format for your next appointment:   In Person  Provider:   You may see Shelva Majestic, MD  or one of the following Advanced Practice Providers on your designated Care Team:    Almyra Deforest, PA-C  Fabian Sharp, PA-C or   Roby Lofts, [place]

## 2019-12-01 NOTE — Progress Notes (Signed)
Patient ID: Nathan Mcclure, male   DOB: 06/09/1951, 68 y.o.   MRN: [id number]    Primary M.D.: Nathan Mcclure  HPI: DEMARRIO Mcclure is a 68 y.o. male who presents to the office for a 27 month followup cardiology/sleep evaluation.  Nathan Mcclure has a history of mild CAD initially found in 2000. His last cardiac catheterization in May 2005 revealed  mild nonobstructive CAD involving the LAD, intermediate, and RCA treated medically. He has a history of hyperlipidemia. He's been diagnosed with rheumatoid arthritis. He had done well with beta blocker therapy for many years but last year stopped taking this when his prescription had run out and he was seen as an add-on on 01/24/2014.  He was found to be in atrial fibrillation with ventricular rate at 110 beats per minute. He was restarted on low-dose metoprolol tartrate at 25 mg twice a day. Laboratory showed a TSH that was normal at 1.12. He had normal CMP profile. Creatinine was 0.85. He was started on Xarelto 20 mg with  normal hemoglobin and hematocrit. I further increased his metoprolol to 50 mg twice a day. An echo Doppler study  on 02/23/2014  showed an ejection fraction of 50-55%. There was mild to moderate mitral regurgitation. His left atrium measured 42 mm.  He was initially started on Propofenone150 mg every 8 hours and when attempted to be titrated to 225 mg 3 times a day he was unable to tolerate the increased dose of Propofenone due to nausea and reduced his dose back to 150 mg.   Due to concerns for significant obstructive sleep apnea with excessive daytime sleepiness he  underwent a sleep study which confirmed moderate sleep apnea with an AHI of 15.1.  He dropped his oxygen 87%.  There was heavy snoring.  He had a significant delay in ultimately undergoing a CPAP titration due to his canceling of the study, but ultimately a CPAP trial was done on 06/29/2014.  He was found to develop central apneic events in ultimately was switched to BiPAP.   He was titrated up to an IPAP of 16 and EPAP of 12, but continued to have centrals during sleep-wake junctions.  He has an AirCurve 10 Auto ResMed BiPAP unit and his current settings are for minimum EPAP of 10 with a maximum IPAP of 20.  A download was obtained from October 16 through 11/04/2014.  He met compliance with days of use being 80% but days greater than 4 hours was only 27%.  He was averaging 3 hours and 43 minutes of use.  Upon further questioning, he states as the pressure would build up  he would develop significant leakage  around the seal in the mask and mask would essentially blow off his face.  For this reason, he has not been able to be compliant.  He brings his unit with him and mask to the office today for further evaluation and assessment. A review of his download indicates a significant increase leak at 97.6.  His AHI was 13.4.  He underwent an initial cardioversion  on 11/20/2014.  At that time, he converted with just 1 shock at 120 J to normal sinus rhythm.   When I saw him in 2016 he had not been compliant with his medications and was only intermittently using BiPAP 2 times per week.  Instead of  taking his metoprolol tartrate 75 mg twice a day he was only taking this 50 mg daily.  He had continued Xarelto 20 mg for  anticoagulation.  He began to notice that he was feeling more chest fluttering.  He denied chest pain.  He denies PND or orthopnea.  He has been using [facility] [employer] for his DME company.  When I saw him in November 2016 he was in atrial fibrillation of questionable duration. His resting pulse was 101 and I increased his metoprolol to 75 mg twice a day. He has felt improved. He also has resumed using BiPAP therapy and admits that he feels so much better now that he has been using this on a daily basis.  When seen in December.  He was started on amiodarone 200 mg a day for 1 week and was then titrated to 2 mg twice a day.  He continued to be in atrial fibrillation.  On  02/19/2016 he underwent successful DC cardioversion and required 3 shocks at 120, 150, and ultimately cardioverted at 200 J.  His postprocedure ECG showed sinus bradycardia.  Subsequently, he has been on a reduced dose of metoprolol at 50 mg twice a day but has continued to take the amiodarone 200 mg twice a day.  He omits to 100% compliance with CPAP use.  He continues to be on Xarelto for anticoagulation.  He admits to resting pulse is in the 40s to at times. When I saw for post cardioversion follow-up  I reduced his metoprolol from 50 mg twice a day to 25 mg twice a day.  I again discussed importance of continued treatment for his obstructive sleep apnea.    He developed a kidney stone and underwent successful lithotripsy and held Xarelto for the procedure.  He is still working at [employer].  He has moved into an apartment complex.  Over the past month, he admits to a 10 pound weight gain.  He is walking over 20,000 steps per day.  He denies chest pain or breakthrough atrial fibrillation.  He admits to 100% compliance with BiPAP therapy.  He was recently started on Humira for his rheumatoid arthritis and is followed by Nathan Mcclure at [facility] rheumatology.   When I last saw him in September 2018 he had stopped taking Xarelto 2 months ago due to concerns of getting into the "doughnut hole."  He was not aware of any recurrent episodes of atrial fibrillation.  He continued to take amiodarone 200 mg daily, losartan 50 mg daily, and doxasocin4 mg.  He icontinued to be on Crestor 20 mg for hyperlipidemia.  In May 2018.  Total cholesterol was 166, triglycerides 79, LDL 103.  LFTs were normal.  He was using  BiPAP with 100% compliance.  Based on his CHA2DS2-VASc score I recommended resumption of anticoagulation which he agreed and provided him with samples to initiate therapy.  Since I last saw him, he was evaluated in December 2019 by Nathan Deforest, PA-C for preoperative evaluation.  He has had issues with kidney  stones.  Over the past year, he had a right shoulder rotator cuff tear required surgery.  He is now working for [employer] to soon retire again.  He is walking all day at work.  He denies any episodes of chest pain or awareness of recurrent atrial fibrillation.  He states that he had only been intermittently using BiPAP but a download was attempted to be obtained today did not reveal any recent use.  [employer] is his DME company.  He has not had recent laboratory.  He presents for evaluation.  Past Surgical History:  Procedure Laterality Date  . BACK  SURGERY  2005  . CARDIAC CATHETERIZATION  05/14/2004   Contiued medical therapy  . CARDIOVASCULAR STRESS TEST  09/19/2009   Observed defect is consistent with diaphragmatic attentuation. EKG negative for ischemia.  Marland Kitchen CARDIOVERSION N/A 11/20/2014   Procedure: CARDIOVERSION;  Surgeon: Troy Sine, MD;  Location: [facility] ENDOSCOPY;  Service: Cardiovascular;  Laterality: N/A;  . CARDIOVERSION N/A 02/19/2016   Procedure: CARDIOVERSION;  Surgeon: Troy Sine, MD;  Location: [place];  Service: Cardiovascular;  Laterality: N/A;  . CERVICAL FUSION  2005  . EXTRACORPOREAL SHOCK WAVE LITHOTRIPSY Right 05/09/2019   Procedure: EXTRACORPOREAL SHOCK WAVE LITHOTRIPSY (ESWL);  Surgeon: Festus Aloe, MD;  Location: [facility] ORS;  Service: Urology;  Laterality: Right;  . [place], 2007   stent insertion, stone basket   . LITHOTRIPSY    . RESECTION DISTAL CLAVICAL Right 07/06/2019   Procedure: OPEN DISTAL CLAVICLE EXCISION;  Surgeon: Earlie Server, MD;  Location: [place];  Service: Orthopedics;  Laterality: Right;  . SHOULDER ARTHROSCOPY WITH ROTATOR CUFF REPAIR AND SUBACROMIAL DECOMPRESSION Right 07/06/2019   Procedure: SHOULDER ARTHROSCOPY WITH OPEN ROTATOR CUFF REPAIR, SUBACROMIAL DECOMPRESSION WITH EXTENSIVE DEBRIDEMENT;  Surgeon: Earlie Server, MD;  Location: [place];  Service: Orthopedics;  Laterality:  Right;  . TONSILLECTOMY     as a child  . TRANSTHORACIC ECHOCARDIOGRAM  03/29/2008   EF 67%, mild mitral and tricuspid valve regurg.    Allergies  Allergen Reactions  . Penicillins     Current Outpatient Medications  Medication Sig Dispense Refill  . acetaminophen (TYLENOL) 500 MG tablet Take 1,000 mg by mouth every 6 (six) hours as needed.    Marland Kitchen amiodarone (PACERONE) 200 MG tablet Take 0.5 tablets (100 mg total) by mouth daily. 45 tablet 0  . ENBREL SURECLICK 50 MG/ML injection     . fexofenadine (ALLEGRA) 180 MG tablet Take 180 mg by mouth daily.    . fluticasone (FLONASE) 50 MCG/ACT nasal spray Place into both nostrils daily.    . Glucos-Chondroit-Hyaluron-MSM (GLUCOSAMINE CHONDROITIN JOINT PO) Take 2 tablets by mouth 2 (two) times daily.    . lansoprazole (PREVACID) 15 MG capsule Take 15 mg by mouth at bedtime.    Marland Kitchen losartan (COZAAR) 50 MG tablet TAKE 1 TABLET BY MOUTH TWICE DAILY 180 tablet 0  . Multiple Vitamin (MULTIVITAMIN) tablet Take 1 tablet by mouth daily.    Marland Kitchen oxyCODONE (OXY IR/ROXICODONE) 5 MG immediate release tablet Take one tab po q4-6hrs prn pain, may need 1-2 first couple weeks 40 tablet 0  . rivaroxaban (XARELTO) 20 MG TABS tablet Take 1 tablet (20 mg total) by mouth daily with supper. 30 tablet 5  . sertraline (ZOLOFT) 100 MG tablet Take 100 mg by mouth daily.    . tamsulosin (FLOMAX) 0.4 MG CAPS capsule Take 0.4 mg by mouth daily.     No current facility-administered medications for this visit.    Social History   Socioeconomic History  . Marital status: Married    Spouse name: Not on file  . Number of children: Not on file  . Years of education: Not on file  . Highest education level: Not on file  Occupational History  . Occupation: retired    Comment: [employer]  Tobacco Use  . Smoking status: Former Smoker    Types: Cigarettes    Quit date: 01/25/2004    Years since quitting: 15.8  . Smokeless tobacco: Never Used  Substance and Sexual Activity  .  Alcohol use:  Not Currently    Alcohol/week: 4.0 standard drinks    Types: 4 Standard drinks or equivalent per week    Comment: couple times a month  . Drug use: No  . Sexual activity: Not on file  Other Topics Concern  . Not on file  Social History Narrative  . Not on file   Social Determinants of Health   Financial Resource Strain:   . Difficulty of Paying Living Expenses: Not on file  Food Insecurity:   . Worried About Charity fundraiser in the Last Year: Not on file  . Ran Out of Food in the Last Year: Not on file  Transportation Needs:   . Lack of Transportation (Medical): Not on file  . Lack of Transportation (Non-Medical): Not on file  Physical Activity:   . Days of Exercise per Week: Not on file  . Minutes of Exercise per Session: Not on file  Stress:   . Feeling of Stress : Not on file  Social Connections:   . Frequency of Communication with Friends and Family: Not on file  . Frequency of Social Gatherings with Friends and Family: Not on file  . Attends Religious Services: Not on file  . Active Member of Clubs or Organizations: Not on file  . Attends Archivist Meetings: Not on file  . Marital Status: Not on file  Intimate Partner Violence:   . Fear of Current or Ex-Partner: Not on file  . Emotionally Abused: Not on file  . Physically Abused: Not on file  . Sexually Abused: Not on file   Socially he is widowed for 4 years.  There is no tobacco use.  He had broken up with his fiance.  Family History  Problem Relation Age of Onset  . Diabetes Mother   . Kidney failure Mother   . Heart disease Mother   . Prostate cancer Father   . Alzheimer's disease Father   . Brain cancer Sister   . Lung cancer Brother   . Heart disease Sister   . Tremor Daughter   . Alopecia Daughter     ROS General: Negative; No fevers, chills, or night sweats;  HEENT: Negative; No changes in vision or hearing, sinus congestion, difficulty swallowing Pulmonary: Negative;  No cough, wheezing, shortness of breath, hemoptysis Cardiovascular:  unaware of his atrial fibrillation; No chest pain, presyncope, syncope,  GI: Negative; No nausea, vomiting, diarrhea, or abdominal pain GU: Negative; No dysuria, hematuria, or difficulty voiding Musculoskeletal: Positive for cervical and lumbar disc disease; right shoulder rotator cuff tear  Hematologic/Oncology: Negative; no easy bruising, bleeding Endocrine: Negative; no heat/cold intolerance; no diabetes Neuro: Negative; no changes in balance, headaches Skin: Negative; No rashes or skin lesions Psychiatric: Negative; No behavioral problems, depression Sleep: Positive for moderate complex obstructive sleep apnea on BiPAP with set up date August 16, 2014 [employer] is his DME company, Other comprehensive 14 point system review is negative.  PE BP (!) 150/90 (BP Location: Left Arm, Patient Position: Sitting, Cuff Size: Normal)   Pulse (!) 56   Temp 98.1 F (36.7 C)   Ht 6' 1"  (1.854 m)   Wt 230 lb (104.3 kg)   BMI 30.34 kg/m    Repeat blood pressure by me was 166/90.  Wt Readings from Last 3 Encounters:  12/01/19 230 lb (104.3 kg)  07/06/19 226 lb 3.1 oz (102.6 kg)  05/09/19 225 lb (102.1 kg)   General: Alert, oriented, no distress.  Skin: normal turgor, no rashes, warm  and dry HEENT: Normocephalic, atraumatic. Pupils equal round and reactive to light; sclera anicteric; extraocular muscles intact; Nose without nasal septal hypertrophy Mouth/Parynx benign; Mallinpatti scale 3 Neck: No JVD, no carotid bruits; normal carotid upstroke Lungs: clear to ausculatation and percussion; no wheezing or rales Chest wall: without tenderness to palpitation Heart: PMI not displaced, RRR, s1 s2 normal, 7-4/2 systolic murmur, no diastolic murmur, no rubs, gallops, thrills, or heaves Abdomen: soft, nontender; no hepatosplenomehaly, BS+; abdominal aorta nontender and not dilated by palpation. Back: no CVA tenderness Pulses  2+ Musculoskeletal: full range of motion, normal strength, no joint deformities Extremities: no clubbing cyanosis or edema, Homan's sign negative  Neurologic: grossly nonfocal; Cranial nerves grossly wnl Psychologic: Normal mood and affect   ECG (independently read by me): Sinus bradycardia 56 bpm.  Nonspecific ST changes.  QTc interval 430 ms  September 2018 ECG (independently read by me): sinus bradycardia at 46 bpm with sinus arrhythmia.  Normal intervals.  December 2017 ECG (independently read by me): Sinus bradycardia 52 bpm.  QTc interval 396 ms.  PR interval 168 ms.  No ectopy.  September 2017 ECG (independently read by me): Marked sinus bradycardia with a heart rate at 37 bpm.  PR interval 180 ms.  QTc interval 414 ms.  May 2017 ECG (independently read by me): Marked sinus bradycardia with a ventricular rate of 37 bpm with sinus arrhythmia  March 2017 ECG (independently read by me): Marked sinus bradycardia 41 bpm.  Nondiagnostic T changes.  QTc interval 528 ms.  12/14/2015 ECG (independently read by me):  Atrial fibrillation at 90 bpm.  Nonspecific T changes.  QTc interval 442 ms.  November 12 2015 ECG (independently read by me): Atrial fibrillation with a rapid ventricular response at 10 1 bpm.  Nonspecific ST-T changes.  11/09/2014 ECG (independently read by me): Atrial fibrillation with controlled ventricular response in the 70s.  QTc interval 407 ms.  June 2015 ECG (independently read by me): Atrial fibrillation with a ventricular rate at 100 beats per minute.  QTc interval 428 ms.  Nonspecific T changes  05/08/2014 ECG (independently read by me): atrial fibrillation at 94 beats per minute.  QTc interval 425 ms.  02/23/2014 ECG (independently read by me) atrial fibrillation at 98 beats per minute. Nonspecific ST-T changes   Prior 02/08/2014 ECG (independently read by me): Atrial fibrillation with a rapid ventricular response at 128 beats per minute with nonspecific ST-T  changes.   LABS: Laboratory from [facility] from 05/21/2017 has been reviewed.  H/H 13.2/41.0.  C-reactive protein 1.8.  BUN 21, creatinine 0.96.  LFTs normal.  BMP Latest Ref Rng & Units 02/13/2016 12/10/2015 11/15/2014  Glucose 65 - 99 mg/dL 73 91 142(H)  BUN 7 - 25 mg/dL 18 24 21   Creatinine 0.70 - 1.25 mg/dL 0.99 0.85 1.00  Sodium 135 - 146 mmol/L 144 139 143  Potassium 3.5 - 5.3 mmol/L 4.2 4.4 4.1  Chloride 98 - 110 mmol/L 106 105 109  CO2 20 - 31 mmol/L 27 28 25   Calcium 8.6 - 10.3 mg/dL 8.7 8.8 9.2   Hepatic Function Latest Ref Rng & Units 12/10/2015 11/15/2014 01/24/2014  Total Protein 6.1 - 8.1 g/dL 6.7 6.9 6.7  Albumin 3.6 - 5.1 g/dL 4.1 4.2 4.1  AST 10 - 35 U/L 20 22 23   ALT 9 - 46 U/L 19 21 21   Alk Phosphatase 40 - 115 U/L 53 60 66  Total Bilirubin 0.2 - 1.2 mg/dL 0.7 0.8 0.4   CBC Latest Ref  Rng & Units 02/13/2016 12/10/2015 11/15/2014  WBC 4.0 - 10.5 K/uL 6.4 5.4 6.0  Hemoglobin 13.0 - 17.0 g/dL 14.8 13.5 16.1  Hematocrit 39.0 - 52.0 % 44.6 39.4 47.5  Platelets 150 - 400 K/uL 202 177 212   Lab Results  Component Value Date   MCV 94.7 02/13/2016   MCV 94.0 12/10/2015   MCV 91.3 11/15/2014   Lab Results  Component Value Date   TSH 1.113 12/10/2015  No results found for: HGBA1C   Lipid Panel     Component Value Date/Time   CHOL 145 12/10/2015 1330   TRIG 77 12/10/2015 1330   HDL 45 12/10/2015 1330   CHOLHDL 3.2 12/10/2015 1330   VLDL 15 12/10/2015 1330   LDLCALC 85 12/10/2015 1330    RADIOLOGY: No results found.  IMPRESSION:  1. Coronary artery disease involving native coronary artery of native heart without angina pectoris   2. Paroxysmal atrial fibrillation (HCC)   3. Essential hypertension   4. Complex sleep apnea syndrome   5. Hyperlipidemia LDL goal <70   6. Therapeutic drug monitoring      ASSESSMENT AND PLAN: Mr. Mrk Mcclure is a 68 year-old gentleman who has mild nonobstructive coronary disease. He developed atrial  fibrillation after stopping his metoprolol in 2015.  An echo Doppler study confirmed an ejection fraction of 50-55% with mild-to-moderate mitral regurgitation and mild biatrial enlargement. Remotely he had been on propofenone and metoprolol.since his cardioversion in February 2017.  He has been maintaining sinus rhythm and has been without awareness of recurrent arrhythmia.  He has complex sleep apnea and had initiated BiPAP therapy on August 16, 2014.  Over the years he has had variance in compliance.  However it appears recently he has not been using therapy.  I again discussed with him the importance of continued treatment particularly with his remote history of PAF and potential for increased risk for recurrent AF if sleep apnea is untreated.  He continues to be on amiodarone 200 mg daily and has continued to be on Xarelto.  Remotely he had stopped this but with his CHA2DS2-VASc score it was recommended to continue long-term treatment.  His blood pressure today was elevated at 150/90 but on repeat by me was 132/84.  He has been on losartan 50 mg twice a day for blood pressure control.  He has rheumatoid iritis and now is on Enbrel sure click.  He has not had any anginal symptomatology.  I am recommending a complete set of fasting laboratory with a chemistry profile TSH CBC and lipid studies.  I again had a long discussion with him regarding resumption of BiPAP therapy any is committed to reinitiate treatment.  I will check him regarding his laboratory and see him in 3 months for follow-up evaluation.  Time spent: 25 was Troy Sine, MD, [facility]  12/08/2019 6:44 AM

## 2019-12-08 ENCOUNTER — Encounter: Payer: Self-pay | Admitting: Cardiovascular Disease

## 2019-12-14 DIAGNOSIS — Z20828 Contact with and (suspected) exposure to other viral communicable diseases: Secondary | ICD-10-CM | POA: Diagnosis not present

## 2019-12-14 DIAGNOSIS — Z03818 Encounter for observation for suspected exposure to other biological agents ruled out: Secondary | ICD-10-CM | POA: Diagnosis not present

## 2019-12-17 ENCOUNTER — Ambulatory Visit
Admission: RE | Admit: 2019-12-17 | Discharge: 2019-12-17 | Disposition: A | Discharge status: Home / Self Care | Payer: BC Managed Care – PPO | Source: Ambulatory Visit | Attending: Orthopaedic Surgery | Admitting: Orthopaedic Surgery

## 2019-12-17 ENCOUNTER — Other Ambulatory Visit: Payer: Self-pay

## 2019-12-17 DIAGNOSIS — M542 Cervicalgia: Secondary | ICD-10-CM

## 2019-12-17 DIAGNOSIS — M4802 Spinal stenosis, cervical region: Secondary | ICD-10-CM | POA: Diagnosis not present

## 2019-12-20 ENCOUNTER — Other Ambulatory Visit: Payer: Self-pay | Admitting: Cardiovascular Disease

## 2019-12-22 DIAGNOSIS — Z6831 Body mass index (BMI) 31.0-31.9, adult: Secondary | ICD-10-CM | POA: Diagnosis not present

## 2019-12-22 DIAGNOSIS — G894 Chronic pain syndrome: Secondary | ICD-10-CM | POA: Diagnosis not present

## 2019-12-22 DIAGNOSIS — S46092A Other injury of muscle(s) and tendon(s) of the rotator cuff of left shoulder, initial encounter: Secondary | ICD-10-CM | POA: Diagnosis not present

## 2019-12-22 DIAGNOSIS — I10 Essential (primary) hypertension: Secondary | ICD-10-CM | POA: Diagnosis not present

## 2019-12-23 HISTORY — PX: PROSTATE BIOPSY: SHX241

## 2020-02-12 ENCOUNTER — Ambulatory Visit: Payer: Medicare Other | Attending: Internal Medicine

## 2020-02-12 DIAGNOSIS — Z23 Encounter for immunization: Secondary | ICD-10-CM | POA: Insufficient documentation

## 2020-02-12 NOTE — Progress Notes (Signed)
   Covid-19 Vaccination Clinic  Name:  Nathan Mcclure    MRN: [id number] DOB: [birth year]  02/12/2020  Nathan Mcclure was observed post Covid-19 immunization for 15 minutes without incidence. He was provided with Vaccine Information Sheet and instruction to access the V-Safe system.   Nathan Mcclure was instructed to call 911 with any severe reactions post vaccine: Marland Kitchen Difficulty breathing  . Swelling of your face and throat  . A fast heartbeat  . A bad rash all over your body  . Dizziness and weakness    Immunizations Administered    Name Date Dose VIS Date Route   Pfizer COVID-19 Vaccine 02/12/2020  1:29 PM 0.3 mL 12/02/2019 Intramuscular   Manufacturer: [place]   Lot: [id number]   [place]: [id number]

## 2020-02-23 DIAGNOSIS — M542 Cervicalgia: Secondary | ICD-10-CM | POA: Diagnosis not present

## 2020-02-23 DIAGNOSIS — I10 Essential (primary) hypertension: Secondary | ICD-10-CM | POA: Diagnosis not present

## 2020-02-23 DIAGNOSIS — Z6831 Body mass index (BMI) 31.0-31.9, adult: Secondary | ICD-10-CM | POA: Diagnosis not present

## 2020-02-23 DIAGNOSIS — M25512 Pain in left shoulder: Secondary | ICD-10-CM | POA: Diagnosis not present

## 2020-02-23 DIAGNOSIS — M25511 Pain in right shoulder: Secondary | ICD-10-CM | POA: Diagnosis not present

## 2020-02-23 DIAGNOSIS — G894 Chronic pain syndrome: Secondary | ICD-10-CM | POA: Diagnosis not present

## 2020-02-27 ENCOUNTER — Other Ambulatory Visit: Payer: Self-pay | Admitting: Cardiovascular Disease

## 2020-03-07 ENCOUNTER — Ambulatory Visit: Payer: BC Managed Care – PPO | Admitting: Cardiovascular Disease

## 2020-03-07 ENCOUNTER — Ambulatory Visit: Payer: BC Managed Care – PPO | Attending: Internal Medicine

## 2020-03-07 DIAGNOSIS — Z23 Encounter for immunization: Secondary | ICD-10-CM

## 2020-03-07 NOTE — Progress Notes (Signed)
   Covid-19 Vaccination Clinic  Name:  Nathan Mcclure    MRN: [id number] DOB: December 19, 1951  03/07/2020  Mr. Massiah was observed post Covid-19 immunization for 15 minutes without incident. He was provided with Vaccine Information Sheet and instruction to access the V-Safe system.   Mr. Crull was instructed to call 911 with any severe reactions post vaccine: Marland Kitchen Difficulty breathing  . Swelling of face and throat  . A fast heartbeat  . A bad rash all over body  . Dizziness and weakness   Immunizations Administered    Name Date Dose VIS Date Route   Pfizer COVID-19 Vaccine 03/07/2020  1:21 PM 0.3 mL 12/02/2019 Intramuscular   Manufacturer: [place]   Lot: [id number]   [place]: [id number]

## 2020-03-13 ENCOUNTER — Encounter: Payer: Self-pay | Admitting: Cardiovascular Disease

## 2020-04-05 DIAGNOSIS — M542 Cervicalgia: Secondary | ICD-10-CM | POA: Diagnosis not present

## 2020-04-05 DIAGNOSIS — G894 Chronic pain syndrome: Secondary | ICD-10-CM | POA: Diagnosis not present

## 2020-05-08 DIAGNOSIS — Z79899 Other long term (current) drug therapy: Secondary | ICD-10-CM | POA: Diagnosis not present

## 2020-05-09 DIAGNOSIS — G894 Chronic pain syndrome: Secondary | ICD-10-CM | POA: Diagnosis not present

## 2020-05-09 DIAGNOSIS — M542 Cervicalgia: Secondary | ICD-10-CM | POA: Diagnosis not present

## 2020-05-11 DIAGNOSIS — L03113 Cellulitis of right upper limb: Secondary | ICD-10-CM | POA: Diagnosis not present

## 2020-05-15 DIAGNOSIS — Z6829 Body mass index (BMI) 29.0-29.9, adult: Secondary | ICD-10-CM | POA: Diagnosis not present

## 2020-05-15 DIAGNOSIS — M150 Primary generalized (osteo)arthritis: Secondary | ICD-10-CM | POA: Diagnosis not present

## 2020-05-15 DIAGNOSIS — M2550 Pain in unspecified joint: Secondary | ICD-10-CM | POA: Diagnosis not present

## 2020-05-15 DIAGNOSIS — E663 Overweight: Secondary | ICD-10-CM | POA: Diagnosis not present

## 2020-05-15 DIAGNOSIS — Z79899 Other long term (current) drug therapy: Secondary | ICD-10-CM | POA: Diagnosis not present

## 2020-05-15 DIAGNOSIS — M059 Rheumatoid arthritis with rheumatoid factor, unspecified: Secondary | ICD-10-CM | POA: Diagnosis not present

## 2020-05-22 DIAGNOSIS — R3121 Asymptomatic microscopic hematuria: Secondary | ICD-10-CM | POA: Diagnosis not present

## 2020-05-22 DIAGNOSIS — R3911 Hesitancy of micturition: Secondary | ICD-10-CM | POA: Diagnosis not present

## 2020-05-22 DIAGNOSIS — N200 Calculus of kidney: Secondary | ICD-10-CM | POA: Diagnosis not present

## 2020-05-22 DIAGNOSIS — R972 Elevated prostate specific antigen [PSA]: Secondary | ICD-10-CM | POA: Diagnosis not present

## 2020-05-22 DIAGNOSIS — N403 Nodular prostate with lower urinary tract symptoms: Secondary | ICD-10-CM | POA: Diagnosis not present

## 2020-05-25 ENCOUNTER — Telehealth: Payer: Self-pay

## 2020-05-25 NOTE — Telephone Encounter (Signed)
Patient with diagnosis of atrial fibrillation on Xarelto for anticoagulation.    Procedure: prostate biopsy Date of procedure: tbd  CHADS2-VASc score of  2 (AGE, CAD)  Patient needs to have BMET and CBC for further determination

## 2020-05-25 NOTE — Telephone Encounter (Signed)
Spoke with pt who states that he recently had lab work at [facility]. Pt states he has the records at from his blood work and will fax them to our office. I was going to call [facility] Rhe[facility]ology but there office is close and won't open back until Monday. Phone # is [phone]

## 2020-05-25 NOTE — Telephone Encounter (Signed)
   [facility] Pre-operative Risk Assessment    HEARTCARE STAFF: - Please ensure there is not already an duplicate clearance open for this procedure. - Under Visit Info/Reason for Call, type in Other and utilize the format Clearance MM/DD/YY or Clearance TBD. Do not use dashes or single digits. - If request is for dental extraction, please clarify the # of teeth to be extracted.  Request for surgical clearance:  1. What type of surgery is being performed? Prostate biopsy  2. When is this surgery scheduled? tbd  3. What type of clearance is required (medical clearance vs. Pharmacy clearance to hold med vs. Both)? both  4. Are there any medications that need to be held prior to surgery and how long? Not specified   5. Practice name and name of physician performing surgery? [facility]-- Dr. Irine Seal  6. What is the office phone number? [phone]   7.   What is the office fax number? [phone]  8.   Anesthesia type (None, local, MAC, general) ? choice   Nathan Mcclure Nathan Mcclure 05/25/2020, 10:14 AM  _________________________________________________________________   (provider comments below)

## 2020-05-29 NOTE — Telephone Encounter (Signed)
[facility] and they will not send [place] a copy of the patient's results unless the patient goes by their office and signs a release form.   Called and made the patient aware of what [facility] said. He does not want to go and sign the form and he does not want to come into our office since he just had labs done. He states that he will download his results and send them to [place] through [place].

## 2020-05-30 NOTE — Telephone Encounter (Signed)
See previous notes in regards to lab results. Pt has sent lab results through [facility]. I believe these labs are needed for pre op clearance as pre op provider has requested these labs. I will forward this to pre op for evaluation of lab results in hopes to clear the pt for their surgery.

## 2020-05-30 NOTE — Telephone Encounter (Signed)
Left message to call back and ask to speak with the pre-op team. 

## 2020-05-30 NOTE — Telephone Encounter (Signed)
Pharmacy, can you please take a look at the lab in [place] message from yesterday and give your final recommendations? Hemoglobin and platelets WNL and creatinine 0.93.  Thank you! Porfiria Heinrich

## 2020-05-30 NOTE — Telephone Encounter (Signed)
Normal renal function. CHADS2VASc of 2. Patient may hold Xarelto for 2 days prior to procedure.

## 2020-05-31 NOTE — Telephone Encounter (Signed)
° °  Primary Cardiologist: Shelva Majestic, MD  Chart reviewed as part of pre-operative protocol coverage. Given past medical history and time since last visit, based on ACC/AHA guidelines, Nathan Mcclure would be at acceptable risk for the planned procedure without further cardiovascular testing.   Patient with diagnosis of atrial fibrillation on Xarelto for anticoagulation.    Procedure: prostate biopsy Date of procedure: tbd  CHADS2-VASc score of  2 (AGE, CAD)  Normal renal function  Patient may hold Xarelto for 2 days prior to procedure.  I will route this recommendation to the requesting party via Epic fax function and remove from pre-op pool.  Please call with questions.  Jossie Ng. Robt Okuda NP-C    05/31/2020, 4:07 PM [facility] [address] Office [phone] Fax [phone]

## 2020-05-31 NOTE — Telephone Encounter (Signed)
Patient returning call.

## 2020-06-01 DIAGNOSIS — H43812 Vitreous degeneration, left eye: Secondary | ICD-10-CM | POA: Diagnosis not present

## 2020-06-12 DIAGNOSIS — M4712 Other spondylosis with myelopathy, cervical region: Secondary | ICD-10-CM | POA: Diagnosis not present

## 2020-06-12 DIAGNOSIS — G894 Chronic pain syndrome: Secondary | ICD-10-CM | POA: Diagnosis not present

## 2020-06-12 DIAGNOSIS — M542 Cervicalgia: Secondary | ICD-10-CM | POA: Diagnosis not present

## 2020-06-12 DIAGNOSIS — M545 Low back pain: Secondary | ICD-10-CM | POA: Diagnosis not present

## 2020-06-20 ENCOUNTER — Other Ambulatory Visit: Payer: Self-pay

## 2020-06-20 MED ORDER — RIVAROXABAN 20 MG PO TABS: ORAL_TABLET | ORAL | 1 refills | Status: DC

## 2020-06-20 NOTE — Telephone Encounter (Signed)
Prescription refill request for Xarelto received.  Indication: Atrial Fibrillation Last office visit: Dr Claiborne Billings 12/01/2019 Weight: 104.3 kg Age: 69 Scr: 0.93 05/15/20 CrCl: 112.2 mg/min  Prescription refilled

## 2020-07-02 ENCOUNTER — Other Ambulatory Visit: Payer: Self-pay | Admitting: Orthopedic Surgery

## 2020-07-02 DIAGNOSIS — M25512 Pain in left shoulder: Secondary | ICD-10-CM

## 2020-07-11 DIAGNOSIS — R972 Elevated prostate specific antigen [PSA]: Secondary | ICD-10-CM | POA: Diagnosis not present

## 2020-07-11 DIAGNOSIS — C61 Malignant neoplasm of prostate: Secondary | ICD-10-CM | POA: Diagnosis not present

## 2020-07-12 DIAGNOSIS — M4712 Other spondylosis with myelopathy, cervical region: Secondary | ICD-10-CM | POA: Diagnosis not present

## 2020-07-12 DIAGNOSIS — M545 Low back pain: Secondary | ICD-10-CM | POA: Diagnosis not present

## 2020-07-12 DIAGNOSIS — G894 Chronic pain syndrome: Secondary | ICD-10-CM | POA: Diagnosis not present

## 2020-07-20 ENCOUNTER — Other Ambulatory Visit: Payer: Self-pay | Admitting: Orthopaedic Surgery

## 2020-07-20 DIAGNOSIS — M545 Low back pain, unspecified: Secondary | ICD-10-CM

## 2020-07-26 ENCOUNTER — Other Ambulatory Visit: Payer: BC Managed Care – PPO

## 2020-08-01 DIAGNOSIS — N5201 Erectile dysfunction due to arterial insufficiency: Secondary | ICD-10-CM | POA: Diagnosis not present

## 2020-08-01 DIAGNOSIS — C61 Malignant neoplasm of prostate: Secondary | ICD-10-CM | POA: Diagnosis not present

## 2020-08-01 DIAGNOSIS — N401 Enlarged prostate with lower urinary tract symptoms: Secondary | ICD-10-CM | POA: Diagnosis not present

## 2020-08-01 DIAGNOSIS — R3912 Poor urinary stream: Secondary | ICD-10-CM | POA: Diagnosis not present

## 2020-08-02 DIAGNOSIS — M25512 Pain in left shoulder: Secondary | ICD-10-CM | POA: Diagnosis not present

## 2020-08-06 DIAGNOSIS — M25512 Pain in left shoulder: Secondary | ICD-10-CM | POA: Diagnosis not present

## 2020-08-20 NOTE — Progress Notes (Signed)
GU Location of Tumor / Histology: prostatic adenocarcinoma  If Prostate Cancer, Gleason Score is (3 + 4) and PSA is (7.82). Prostate volume: 39.4 ml  Nathan Mcclure presented last year for a left ureteral calculi and a hx of right prostate nodule with LUTS and elevated PSA. Patient deferred biopsy due to kidney stone treatment and rotator cuff surgery. Patient did not return until 05/22/2020.  Biopsies of prostate (if applicable) revealed:    Past/Anticipated interventions by urology, if any: prostate biopsy, referral to Dr. Tammi Klippel  Past/Anticipated interventions by medical oncology, if any: no  Weight changes, if any: no  Bowel/Bladder complaints, if any: IPSS 13. SHIM 24. Denies dysuria or hematuria. Reports occasional urinary leakage related to urinary urgency. Denies any bowel complaints.    Nausea/Vomiting, if any: no  Pain issues, if any:  Reports intermittent neck and left shoulder pain related to osteoarthritis and history of back surgeries.   SAFETY ISSUES:  Prior radiation? no  Pacemaker/ICD? no  Possible current pregnancy? no, male patient  Is the patient on methotrexate? no  Current Complaints / other details:  69 year old male. Widowed. Resides in [place]. Lost wife to cancer ten years ago. Sister died of brain tumor. Brother with hx of kidney cancer. One of 9 siblings (6 girls and 3 boys)

## 2020-08-21 ENCOUNTER — Encounter: Payer: Self-pay | Admitting: Licensed Clinical Social Worker

## 2020-08-21 ENCOUNTER — Encounter: Payer: Self-pay | Admitting: Medical Oncology

## 2020-08-21 ENCOUNTER — Ambulatory Visit
Admission: RE | Admit: 2020-08-21 | Discharge: 2020-08-21 | Disposition: A | Discharge status: Home / Self Care | Payer: BC Managed Care – PPO | Source: Ambulatory Visit | Attending: Radiation Oncology | Admitting: Radiation Oncology

## 2020-08-21 ENCOUNTER — Encounter: Payer: Self-pay | Admitting: Radiation Oncology

## 2020-08-21 ENCOUNTER — Other Ambulatory Visit: Payer: Self-pay

## 2020-08-21 VITALS — BP 149/84 | HR 63 | Temp 98.2°F | Resp 20 | Ht 72.0 in | Wt 218.6 lb

## 2020-08-21 DIAGNOSIS — R972 Elevated prostate specific antigen [PSA]: Secondary | ICD-10-CM | POA: Diagnosis not present

## 2020-08-21 DIAGNOSIS — C61 Malignant neoplasm of prostate: Secondary | ICD-10-CM | POA: Insufficient documentation

## 2020-08-21 DIAGNOSIS — Z8042 Family history of malignant neoplasm of prostate: Secondary | ICD-10-CM | POA: Diagnosis not present

## 2020-08-21 DIAGNOSIS — Z87442 Personal history of urinary calculi: Secondary | ICD-10-CM | POA: Diagnosis not present

## 2020-08-21 HISTORY — DX: Malignant neoplasm of prostate: C61

## 2020-08-21 NOTE — Progress Notes (Signed)
[place] Psychosocial Distress Screening Clinical Social Work  Clinical Social Work was referred by distress screening protocol.  The patient scored a 6 on the Psychosocial Distress Thermometer which indicates moderate distress. Clinical Social Worker contacted patient by phone to assess for distress and other psychosocial needs.   Patient reports doing well after his appointment this morning, stating he had a good experience and was impressed with the team. Current stress is figuring out plan for work as he started his job in January, so is not eligible for FMLA yet. He does have access to leave of absence, short and long-term disability. He will be contacting HR tomorrow to work through options.  Patient is widowed but has a daughter and son out-of-state as well as a local support network. No other needs at this time.   CSW informed patient of the support team and support services at [facility].  CSW provided contact information and encouraged patient to call with any questions or concerns.   ONCBCN DISTRESS SCREENING 08/21/2020  Screening Type Initial Screening  Distress experienced in past week (1-10) 6  Practical problem type Work/school  Emotional problem type Nervousness/Anxiety  Physical Problem type Pain;Sleep/insomnia  Physician notified of physical symptoms Yes  Referral to clinical psychology No  Referral to clinical social work Yes  Referral to dietition No  Referral to financial advocate No  Referral to support programs Yes  Referral to palliative care No  Other email or text   Clinical Social Worker follow up needed: No.  If yes, follow up plan:  Reza Crymes, [place], LCSW

## 2020-08-21 NOTE — Progress Notes (Signed)
Radiation Oncology         [phone] ________________________________  Initial outpatient Consultation  Name: Nathan Mcclure MRN: [id number]  Date: 08/21/2020  DOB: [birth year]  PJ:ASNKN, Nathan Jew, MD  Nathan Seal, MD   REFERRING PHYSICIAN: Irine Seal, MD  DIAGNOSIS: 69 y.o. gentleman with Stage T2a adenocarcinoma of the prostate with Gleason score of 3+4, and PSA of 7.82.    ICD-10-CM   1. Malignant neoplasm of prostate [facility])  [place] [facility] referral to Social Work    HISTORY OF PRESENT ILLNESS: Nathan Mcclure is a 69 y.o. male with a diagnosis of prostate cancer. He has been followed by Dr. Jeffie Pollock previously for a history of kidney stones. He was initially noted to have an abnormal DRE with elevated PSA of 4.85 in 10/2018.  Repeat PSA in 03/2019 remained elevated at 4.86.  At that time, he opted to defer biopsy due to undergoing kidney stone treatment and rotator cuff surgery.  He recently returned for follow up on 05/22/2020,  digital rectal examination was performed at that time revealing a 7 mm right mid gland prostate nodule and a repeat PSA performed that day showed further elevation to 7.82. The patient proceeded to transrectal ultrasound with 12 biopsies of the prostate on 07/11/2020.  The prostate volume measured 39.4 cc.  Out of 12 core biopsies, 4 were positive, all located on the right side.  The maximum Gleason score was 3+4, and this was seen in the right base (with PNI), right mid (with PNI), and right mid lateral. Additionally, Gleason 3+3 was seen in the right apex (with PNI).  The patient reviewed the biopsy results with his urologist and he has kindly been referred today for discussion of potential radiation treatment options.   PREVIOUS RADIATION THERAPY: No  PAST MEDICAL HISTORY:  Past Medical History:  Diagnosis Date  . Anxiety   . Arthritis   . Atrial fibrillation (Nathan Mcclure)   . Coronary artery disease    history of Atrial Fib.  . Depression   . GERD  (gastroesophageal reflux disease)   . History of kidney stones   . Hyperlipidemia   . Hypertension   . kidney stone   . Low testosterone   . Palpitations   . Prostate cancer (Nathan Mcclure)   . Rheumatoid arthritis (Nathan Mcclure)   . Sleep apnea    has CPAP but not worn in 2 mos      PAST SURGICAL HISTORY: Past Surgical History:  Procedure Laterality Date  . BACK SURGERY  2005  . CARDIAC CATHETERIZATION  05/14/2004   Contiued medical therapy  . CARDIOVASCULAR STRESS TEST  09/19/2009   Observed defect is consistent with diaphragmatic attentuation. EKG negative for ischemia.  Marland Kitchen CARDIOVERSION N/A 11/20/2014   Procedure: CARDIOVERSION;  Surgeon: Troy Sine, MD;  Location: [facility] END[facility]COPY;  Service: Cardiovascular;  Laterality: N/A;  . CARDIOVERSION N/A 02/19/2016   Procedure: CARDIOVERSION;  Surgeon: Troy Sine, MD;  Location: [facility];  Service: Cardiovascular;  Laterality: N/A;  . CERVICAL FUSION  2005  . EXTRACORPOREAL SHOCK WAVE LITHOTRIPSY Right 05/09/2019   Procedure: EXTRACORPOREAL SHOCK WAVE LITHOTRIPSY (ESWL);  Surgeon: Festus Aloe, MD;  Location: [facility];  Service: Urology;  Laterality: Right;  . [place], 2007   stent insertion, stone basket   . LITHOTRIPSY    . RESECTION DISTAL CLAVICAL Right 07/06/2019   Procedure: OPEN DISTAL CLAVICLE EXCISION;  Surgeon: Earlie Server, MD;  Location: [facility];  Service: Orthopedics;  Laterality: Right;  .  SHOULDER ARTHROSCOPY WITH ROTATOR CUFF REPAIR AND SUBACROMIAL DECOMPRESSION Right 07/06/2019   Procedure: SHOULDER ARTHROSCOPY WITH OPEN ROTATOR CUFF REPAIR, SUBACROMIAL DECOMPRESSION WITH EXTENSIVE DEBRIDEMENT;  Surgeon: Earlie Server, MD;  Location: [facility];  Service: Orthopedics;  Laterality: Right;  . TONSILLECTOMY     as a child  . TRANSTHORACIC ECHOCARDIOGRAM  03/29/2008   EF 67%, mild mitral and tricuspid valve regurg.    FAMILY HISTORY:  Family History  Problem Relation Age of  Onset  . Diabetes Mother   . Kidney failure Mother   . Heart disease Mother   . Prostate cancer Father   . Alzheimer's disease Father   . Brain cancer Sister   . Lung cancer Brother   . Heart disease Sister   . Tremor Daughter   . Alopecia Daughter     SOCIAL HISTORY:  Social History   Socioeconomic History  . Marital status: Widowed    Spouse name: Not on file  . Number of children: Not on file  . Years of education: Not on file  . Highest education level: Not on file  Occupational History  . Occupation: retired    Comment: [employer]  Tobacco Use  . Smoking status: Former Smoker    Packs/day: 0.25    Years: 0.50    Pack years: 0.12    Types: Cigarettes    Quit date: 01/25/2004    Years since quitting: 16.5  . Smokeless tobacco: Never Used  Vaping Use  . Vaping Use: Never used  Substance and Sexual Activity  . Alcohol use: Not Currently    Alcohol/week: 4.0 standard drinks    Types: 4 Standard drinks or equivalent per week    Comment: couple times a month  . Drug use: No  . Sexual activity: Not Currently  Other Topics Concern  . Not on file  Social History Narrative   One of nine siblings (6 girls and 3 boys). Lost wife to cancer of the sinus approximately ten years ago (2011).    Social Determinants of Health   Financial Resource Strain:   . Difficulty of Paying Living Expenses: Not on file  Food Insecurity:   . Worried About Charity fundraiser in the Last Year: Not on file  . Ran Out of Food in the Last Year: Not on file  Transportation Needs:   . Lack of Transportation (Medical): Not on file  . Lack of Transportation (Non-Medical): Not on file  Physical Activity:   . Days of Exercise per Week: Not on file  . Minutes of Exercise per Session: Not on file  Stress:   . Feeling of Stress : Not on file  Social Connections:   . Frequency of Communication with Friends and Family: Not on file  . Frequency of Social Gatherings with Friends and Family: Not on file   . Attends Religious Services: Not on file  . Active Member of Clubs or Organizations: Not on file  . Attends Archivist Meetings: Not on file  . Marital Status: Not on file  Intimate Partner Violence:   . Fear of Current or Ex-Partner: Not on file  . Emotionally Abused: Not on file  . Physically Abused: Not on file  . Sexually Abused: Not on file    ALLERGIES: Penicillins  MEDICATIONS:  Current Outpatient Medications  Medication Sig Dispense Refill  . acetaminophen (TYLENOL) 500 MG tablet Take 1,000 mg by mouth every 6 (six) hours as needed.    Marland Kitchen amiodarone (PACERONE)  200 MG tablet Take 0.5 tablets (100 mg total) by mouth daily. 45 tablet 0  . ENBREL SURECLICK 50 MG/ML injection     . fexofenadine (ALLEGRA) 180 MG tablet Take 180 mg by mouth daily.    . fluticasone (FLONASE) 50 MCG/ACT nasal spray Place into both nostrils daily.    . Glucos-Chondroit-Hyaluron-MSM (GLUCOSAMINE CHONDROITIN JOINT PO) Take 2 tablets by mouth 2 (two) times daily.    . lansoprazole (PREVACID) 15 MG capsule Take 15 mg by mouth at bedtime.    Marland Kitchen losartan (COZAAR) 50 MG tablet TAKE 1 TABLET BY MOUTH TWICE A DAY 180 tablet 2  . Multiple Vitamin (MULTIVITAMIN) tablet Take 1 tablet by mouth daily.    Marland Kitchen oxyCODONE (OXY IR/ROXICODONE) 5 MG immediate release tablet Take one tab po q4-6hrs prn pain, may need 1-2 first couple weeks 40 tablet 0  . rivaroxaban (XARELTO) 20 MG TABS tablet TAKE 1 TABLET (20 MG TOTAL) BY MOUTH DAILY WITH SUPPER. 90 tablet 1  . sertraline (ZOLOFT) 100 MG tablet Take 100 mg by mouth daily.    . sildenafil (VIAGRA) 50 MG tablet Take 50 mg by mouth daily as needed for erectile dysfunction.     No current facility-administered medications for this encounter.    REVIEW OF SYSTEMS:  On review of systems, the patient reports that he is doing well overall. He denies any chest pain, shortness of breath, cough, fevers, chills, night sweats, unintended weight changes. He denies any bowel  disturbances, and denies abdominal pain, nausea or vomiting. He denies any new musculoskeletal or joint aches or pains. His IPSS was 13, indicating moderate urinary symptoms with weak stream, urinary urgency and occasional leakage. His SHIM was 24, indicating he does not have erectile dysfunction. A complete review of systems is obtained and is otherwise negative.    PHYSICAL EXAM:  Wt Readings from Last 3 Encounters:  08/21/20 218 lb 9.6 oz (99.2 kg)  12/01/19 230 lb (104.3 kg)  07/06/19 226 lb 3.1 oz (102.6 kg)   Temp Readings from Last 3 Encounters:  08/21/20 98.2 F (36.8 C)  12/01/19 98.1 F (36.7 C)  07/07/19 (!) 97.5 F (36.4 C)   BP Readings from Last 3 Encounters:  08/21/20 (!) 149/84  12/01/19 (!) 150/90  07/07/19 135/78   Pulse Readings from Last 3 Encounters:  08/21/20 63  12/01/19 (!) 56  07/07/19 84   Pain Assessment Pain Score: 0-No pain/10  In general this is a well appearing Caucasian male in no acute distress. He is alert and oriented x4 and appropriate throughout the examination. HEENT reveals that the patient is normocephalic, atraumatic. EOMs are intact. PERRLA. Skin is intact without any evidence of gross lesions. Cardiopulmonary assessment is negative for acute distress and he exhibits normal effort. The abdomen is soft, non tender, non distended. Lower extremities are negative for pretibial pitting edema, deep calf tenderness, cyanosis or clubbing.   KPS = 100  100 - Normal; no complaints; no evidence of disease. 90   - Able to carry on normal activity; minor signs or symptoms of disease. 80   - Normal activity with effort; some signs or symptoms of disease. 55   - Cares for self; unable to carry on normal activity or to do active work. 60   - Requires occasional assistance, but is able to care for most of his personal needs. 50   - Requires considerable assistance and frequent medical care. 60   - Disabled; requires special care and assistance. 30    -  Severely disabled; hospital admission is indicated although death not imminent. 41   - Very sick; hospital admission necessary; active supportive treatment necessary. 10   - Moribund; fatal processes progressing rapidly. 0     - Dead  Karnofsky DA, Abelmann Palmer, Craver LS and Burchenal [facility] [phone]) The use of the nitrogen mustards in the palliative treatment of carcinoma: with particular reference to bronchogenic carcinoma Cancer 1 634-56  LABORATORY DATA:  Lab Results  Component Value Date   WBC 6.4 02/13/2016   HGB 14.8 02/13/2016   HCT 44.6 02/13/2016   MCV 94.7 02/13/2016   PLT 202 02/13/2016   Lab Results  Component Value Date   NA 144 02/13/2016   K 4.2 02/13/2016   CL 106 02/13/2016   CO2 27 02/13/2016   Lab Results  Component Value Date   ALT 19 12/10/2015   AST 20 12/10/2015   ALKPHOS 53 12/10/2015   BILITOT 0.7 12/10/2015     RADIOGRAPHY: No results found.    IMPRESSION/PLAN: 1. 69 y.o. gentleman with Stage T2a adenocarcinoma of the prostate with Gleason Score of 3+4, and PSA of 7.82. We discussed the patient's workup and outlined the nature of prostate cancer in this setting. The patient's T stage, Gleason's score, and PSA put him into the favorable intermediate risk group. Accordingly, he is eligible for a variety of potential treatment options including brachytherapy, 5.5 weeks of external radiation, or prostatectomy. We discussed the available radiation techniques, and focused on the details and logistics of delivery. We discussed and outlined the risks, benefits, short and long-term effects associated with radiotherapy and compared and contrasted these with prostatectomy. We discussed the role of SpaceOAR gel in reducing the rectal toxicity associated with radiotherapy. He appears to have a good understanding of his disease and our treatment recommendations which are of curative intent.  He was encouraged to ask questions that were answered to his stated  satisfaction.  At the conclusion of our conversation, the patient is interested in moving forward with brachytherapy and use of SpaceOAR gel to reduce rectal toxicity from radiotherapy.  We will share our discussion with Dr. Jeffie Pollock and move forward with scheduling his [facility] planning appointment in the near future.  The patient met briefly with Romie Jumper in our office who will be working closely with him to coordinate OR scheduling and pre and post procedure appointments.  We will contact the pharmaceutical rep to ensure that [place] is available at the time of procedure.  We enjoyed meeting him today and look forward to continuing to participate in his care.    Nicholos Johns, PA-C    Tyler Pita, MD  [facility] Direct Dial: [phone]  Fax: [phone] [email or web address]  Skype  [email or web address]   This document serves as a record of services personally performed by Tyler Pita, MD and Freeman Caldron, PA-C. It was created on their behalf by Wilburn Mylar, a trained medical scribe. The creation of this record is based on the scribe's personal observations and the provider's statements to them. This document has been checked and approved by the attending provider.

## 2020-08-28 ENCOUNTER — Ambulatory Visit: Payer: BC Managed Care – PPO | Admitting: Radiation Oncology

## 2020-08-28 ENCOUNTER — Ambulatory Visit: Payer: BC Managed Care – PPO

## 2020-08-29 ENCOUNTER — Telehealth: Payer: Self-pay | Admitting: *Deleted

## 2020-08-29 NOTE — Telephone Encounter (Signed)
CALLED PATIENT TO INFORM OF PRE-SEED APPTS. FOR 09-27-20, SPOKE WITH PATIENT AND HE IS AWARE OF THESE APPTS.

## 2020-08-30 ENCOUNTER — Other Ambulatory Visit: Payer: Self-pay | Admitting: Urology

## 2020-08-30 ENCOUNTER — Telehealth: Payer: Self-pay | Admitting: Cardiovascular Disease

## 2020-08-30 ENCOUNTER — Telehealth: Payer: Self-pay | Admitting: *Deleted

## 2020-08-30 NOTE — Telephone Encounter (Signed)
° °  [facility] Pre-operative Risk Assessment    HEARTCARE STAFF: - Please ensure there is not already an duplicate clearance open for this procedure. - Under Visit Info/Reason for Call, type in Other and utilize the format Clearance MM/DD/YY or Clearance TBD. Do not use dashes or single digits. - If request is for dental extraction, please clarify the # of teeth to be extracted.  Request for surgical clearance:  1. What type of surgery is being performed? Radioactive seed implant for prostate cancer  2. When is this surgery scheduled? 10/25/20  3. What type of clearance is required (medical clearance vs. Pharmacy clearance to hold med vs. Both)? both  4. Are there any medications that need to be held prior to surgery and how long? Xarelto - up to cardiology how long.    5. Practice name and name of physician performing surgery? Dr. Jeffie Pollock at [facility]  6. What is the office phone number?[place]   7.   What is the office fax number? [phone]  8.   Anesthesia type (None, local, MAC, general) ? General    Leah Newnam 08/30/2020, 3:45 PM  _________________________________________________________________   (provider comments below)

## 2020-08-30 NOTE — Telephone Encounter (Signed)
Called patient to inform of pre-seed appts. and implant, spoke with patient and he is aware of these appts 

## 2020-09-03 ENCOUNTER — Other Ambulatory Visit: Payer: Self-pay | Admitting: Physician Assistant

## 2020-09-03 NOTE — Telephone Encounter (Signed)
Clinical pharmacist to review Xarelto 

## 2020-09-04 DIAGNOSIS — M4712 Other spondylosis with myelopathy, cervical region: Secondary | ICD-10-CM | POA: Diagnosis not present

## 2020-09-04 DIAGNOSIS — M545 Low back pain: Secondary | ICD-10-CM | POA: Diagnosis not present

## 2020-09-04 DIAGNOSIS — G894 Chronic pain syndrome: Secondary | ICD-10-CM | POA: Diagnosis not present

## 2020-09-05 NOTE — Telephone Encounter (Signed)
   Primary Cardiologist:Thomas Claiborne Billings, MD (last seen 11/2019)  Chart reviewed as part of pre-operative protocol coverage. Because of Nathan Mcclure's past medical history and time since last visit, they will require a follow-up visit in order to better assess preoperative cardiovascular risk.  Pre-op covering staff: - Please schedule appointment and call patient to inform them. If patient already had an upcoming appointment within acceptable timeframe, please add "pre-op clearance" to the appointment notes so provider is aware. - Please contact requesting surgeon's office via preferred method (i.e, phone, fax) to inform them of need for appointment prior to surgery.  If applicable, this message will also be routed to pharmacy pool and/or primary cardiologist for input on holding anticoagulant/antiplatelet agent as requested below so that this information is available to the clearing provider at time of patient's appointment.   [place], [place]  09/05/2020, 4:14 PM

## 2020-09-05 NOTE — Telephone Encounter (Signed)
Placed call to pt regarding surgical clearance and the need for him to schedule an appointment in order for clearance to be discussed.  Pt stated that he would need to call back when he had his calendar in his hand.  Pt was made aware that the operator that answers the phone can schedule it for him. He verbalized understanding.

## 2020-09-05 NOTE — Telephone Encounter (Signed)
Patient with diagnosis of afib on Xarelto for anticoagulation.    Procedure: Radioactive seed implant for prostate cancer Date of procedure: 10/25/20  CHADS2-VASc score of 3 (age, HTN, CAD)  CrCl >125mL/min Platelet count 228K  Per office protocol, patient can hold Xarelto for 2-3 days prior to procedure.

## 2020-09-10 NOTE — Telephone Encounter (Signed)
Attempted to reach pt to request that he schedule an appointment to discuss pre op clearance but did not get an answer. I left a voicemail asking that he contact the office to schedule.

## 2020-09-11 NOTE — Telephone Encounter (Signed)
Pt aware of needing pre op appt. Pt has been scheduled to see Robbie Lis, [facility] 10/02/20 @ 8:45 at the [facility] location as no available in person appts available at [facility] office at the time of my call with the pt. Pt is agreeable to [place] location and has been given address [address]. Pt thanked me for the call and the help.

## 2020-09-12 DIAGNOSIS — Z23 Encounter for immunization: Secondary | ICD-10-CM | POA: Diagnosis not present

## 2020-09-12 DIAGNOSIS — F339 Major depressive disorder, recurrent, unspecified: Secondary | ICD-10-CM | POA: Diagnosis not present

## 2020-09-12 DIAGNOSIS — F439 Reaction to severe stress, unspecified: Secondary | ICD-10-CM | POA: Diagnosis not present

## 2020-09-12 DIAGNOSIS — M0589 Other rheumatoid arthritis with rheumatoid factor of multiple sites: Secondary | ICD-10-CM | POA: Diagnosis not present

## 2020-09-26 ENCOUNTER — Telehealth: Payer: Self-pay | Admitting: *Deleted

## 2020-09-26 NOTE — Telephone Encounter (Signed)
CALLED PATIENT TO REMIND OF PRE-SEED APPTS. FOR 09-27-20, LVM FOR A RETURN CALL

## 2020-09-27 ENCOUNTER — Encounter: Payer: Self-pay | Admitting: Medical Oncology

## 2020-09-27 ENCOUNTER — Encounter (HOSPITAL_COMMUNITY)
Admission: RE | Admit: 2020-09-27 | Discharge: 2020-09-27 | Disposition: A | Discharge status: Home / Self Care | Payer: BC Managed Care – PPO | Source: Ambulatory Visit | Attending: Urology | Admitting: Urology

## 2020-09-27 ENCOUNTER — Ambulatory Visit
Admission: RE | Admit: 2020-09-27 | Discharge: 2020-09-27 | Disposition: A | Discharge status: Home / Self Care | Payer: BC Managed Care – PPO | Source: Ambulatory Visit | Attending: Radiation Oncology | Admitting: Radiation Oncology

## 2020-09-27 ENCOUNTER — Ambulatory Visit (HOSPITAL_COMMUNITY)
Admission: RE | Admit: 2020-09-27 | Discharge: 2020-09-27 | Disposition: A | Discharge status: Home / Self Care | Payer: BC Managed Care – PPO | Source: Ambulatory Visit | Attending: Urology | Admitting: Urology

## 2020-09-27 ENCOUNTER — Other Ambulatory Visit: Payer: Self-pay

## 2020-09-27 ENCOUNTER — Ambulatory Visit
Admission: RE | Admit: 2020-09-27 | Discharge: 2020-09-27 | Disposition: A | Discharge status: Home / Self Care | Payer: Medicare HMO | Source: Ambulatory Visit | Attending: Urology | Admitting: Urology

## 2020-09-27 DIAGNOSIS — C61 Malignant neoplasm of prostate: Secondary | ICD-10-CM | POA: Diagnosis not present

## 2020-09-27 DIAGNOSIS — Z01818 Encounter for other preprocedural examination: Secondary | ICD-10-CM | POA: Diagnosis not present

## 2020-09-27 NOTE — Progress Notes (Signed)
Introduced myself as the prostate nurse navigator and discussed my role. He is here to discuss his radiation options and is most interested in brachytherapy. No barriers to care identified. I gave him my contact information and asked him to call me with questions or concerns.

## 2020-10-01 NOTE — Progress Notes (Signed)
  Radiation Oncology         [phone] ________________________________  Name: Nathan Mcclure MRN: [id number]  Date: 09/27/2020  DOB: [birth year]  SIMULATION AND TREATMENT PLANNING NOTE PUBIC ARCH STUDY  AU:QJFHL, Thayer Jew, MD  Irine Seal, MD  DIAGNOSIS:  69 y.o. gentleman with Stage T2a adenocarcinoma of the prostate with Gleason score of 3+4, and PSA of 7.82    ICD-10-CM   1. Malignant neoplasm of prostate ([place])  C61     COMPLEX SIMULATION:  The patient presented today for evaluation for possible prostate seed implant. He was brought to the radiation planning suite and placed supine on the CT couch. A 3-dimensional image study set was obtained in upload to the planning computer. There, on each axial slice, I contoured the prostate gland. Then, using three-dimensional radiation planning tools I reconstructed the prostate in view of the structures from the transperineal needle pathway to assess for possible pubic arch interference. In doing so, I did not appreciate any pubic arch interference. Also, the patient's prostate volume was estimated based on the drawn structure. The volume was 39 cc.  Given the pubic arch appearance and prostate volume, patient remains a good candidate to proceed with prostate seed implant. Today, he freely provided informed written consent to proceed.    PLAN: The patient will undergo prostate seed implant.   ________________________________  Sheral Apley. Tammi Klippel, M.D.

## 2020-10-02 ENCOUNTER — Ambulatory Visit (INDEPENDENT_AMBULATORY_CARE_PROVIDER_SITE_OTHER): Payer: BC Managed Care – PPO | Admitting: Physician Assistant

## 2020-10-02 ENCOUNTER — Encounter: Payer: Self-pay | Admitting: Physician Assistant

## 2020-10-02 ENCOUNTER — Other Ambulatory Visit: Payer: Self-pay

## 2020-10-02 ENCOUNTER — Other Ambulatory Visit: Payer: Self-pay | Admitting: *Deleted

## 2020-10-02 VITALS — BP 140/78 | HR 44 | Ht 72.0 in | Wt 219.0 lb

## 2020-10-02 DIAGNOSIS — Z01810 Encounter for preprocedural cardiovascular examination: Secondary | ICD-10-CM

## 2020-10-02 DIAGNOSIS — I10 Essential (primary) hypertension: Secondary | ICD-10-CM

## 2020-10-02 DIAGNOSIS — Z01818 Encounter for other preprocedural examination: Secondary | ICD-10-CM

## 2020-10-02 DIAGNOSIS — I2510 Atherosclerotic heart disease of native coronary artery without angina pectoris: Secondary | ICD-10-CM | POA: Diagnosis not present

## 2020-10-02 DIAGNOSIS — I480 Paroxysmal atrial fibrillation: Secondary | ICD-10-CM

## 2020-10-02 MED ORDER — AMIODARONE HCL 200 MG PO TABS
100.0000 mg | ORAL_TABLET | Freq: Every day | ORAL | 1 refills | Status: DC
Start: 2020-10-02 — End: 2020-11-06

## 2020-10-02 MED ORDER — DILTIAZEM HCL 30 MG PO TABS: 30.0000 mg | ORAL_TABLET | Freq: Four times a day (QID) | ORAL | 1 refills | Status: DC | PRN

## 2020-10-02 NOTE — Patient Instructions (Addendum)
Medication Instructions:  Your physician has recommended you make the following change in your medication:  1.  START Cardizem 30 mg taking 1 tablet every 6 hours only as needed for palpitations.   *If you need a refill on your cardiac medications before your next appointment, please call your pharmacy*   Lab Work: None ordered today.  When you go for your lab work for your surgery, please ask them to add a TSH and LIPID (you have to be fasting for this).  The order is already in [place].    If you have labs (blood work) drawn today and your tests are completely normal, you will receive your results only by: Marland Kitchen MyChart Message (if you have MyChart) OR . A paper copy in the mail If you have any lab test that is abnormal or we need to change your treatment, we will call you to review the results.   Testing/Procedures: None ordered   Follow-Up: At [facility], you and your health needs are our priority.  As part of our continuing mission to provide you with exceptional heart care, we have created designated Provider Care Teams.  These Care Teams include your primary Cardiologist (physician) and Advanced Practice Providers (APPs -  Physician Assistants and Nurse Practitioners) who all work together to provide you with the care you need, when you need it.  We recommend signing up for the patient portal called "MyChart".  Sign up information is provided on this After Visit Summary.  MyChart is used to connect with patients for Virtual Visits (Telemedicine).  Patients are able to view lab/test results, encounter notes, upcoming appointments, etc.  Non-urgent messages can be sent to your provider as well.   To learn more about what you can do with MyChart, go to [email or web address].    Your next appointment:   3 month(s)  The format for your next appointment:   In Person  Provider:   Shelva Majestic, MD   Other Instructions

## 2020-10-02 NOTE — Progress Notes (Signed)
Cardiology Office Note:    Date:  10/02/2020   ID:  Nathan Mcclure, DOB [birth year], MRN [id number]  PCP:  Deland Pretty, MD  [facility] Cardiologist:  Shelva Majestic, MD  [place] Electrophysiologist:  None   Referring MD: Deland Pretty, MD   No chief complaint on file. Surgical clearance for Radioactive seed implant for prostate cancer  History of Present Illness:    Nathan Mcclure is a 69 y.o. male with a hx of mild nonobstructive coronary artery disease, paroxysmal atrial fibrillation, hypertension, hyperlipidemia, RA and obstructive sleep apnea presents for surgical clearance.  Cardiac catheterization in May 2005 showed mild nonobstructive CAD involving LAD, RCA and intermediate.  Echocardiogram March 2015 showed LV function of 50 to 55%, mild to moderate mitral regurgitation.  Last seen by Dr. Claiborne Billings December 2020.   Here today for surgical follow-up.  Patient has recently noted intermittent palpitation lasting for few minutes.  Not sustained.  Very rare.  He feels like this is due to atrial fibrillation.  Denies chest pain, shortness of breath, orthopnea, PND, syncope, lower extremity edema or melena.  Past Medical History:  Diagnosis Date  . Anxiety   . Arthritis   . Atrial fibrillation ([place])   . Coronary artery disease    history of Atrial Fib.  . Depression   . GERD (gastroesophageal reflux disease)   . History of kidney stones   . Hyperlipidemia   . Hypertension   . kidney stone   . Low testosterone   . Palpitations   . Prostate cancer ([place])   . Rheumatoid arthritis ([place])   . Sleep apnea    has CPAP but not worn in 2 mos    Past Surgical History:  Procedure Laterality Date  . BACK SURGERY  2005  . CARDIAC CATHETERIZATION  05/14/2004   Contiued medical therapy  . CARDIOVASCULAR STRESS TEST  09/19/2009   Observed defect is consistent with diaphragmatic attentuation. EKG negative for ischemia.  Marland Kitchen CARDIOVERSION N/A 11/20/2014   Procedure:  CARDIOVERSION;  Surgeon: Troy Sine, MD;  Location: [facility] ENDOSCOPY;  Service: Cardiovascular;  Laterality: N/A;  . CARDIOVERSION N/A 02/19/2016   Procedure: CARDIOVERSION;  Surgeon: Troy Sine, MD;  Location: [place];  Service: Cardiovascular;  Laterality: N/A;  . CERVICAL FUSION  2005  . EXTRACORPOREAL SHOCK WAVE LITHOTRIPSY Right 05/09/2019   Procedure: EXTRACORPOREAL SHOCK WAVE LITHOTRIPSY (ESWL);  Surgeon: Festus Aloe, MD;  Location: [facility] ORS;  Service: Urology;  Laterality: Right;  . [place], 2007   stent insertion, stone basket   . LITHOTRIPSY    . RESECTION DISTAL CLAVICAL Right 07/06/2019   Procedure: OPEN DISTAL CLAVICLE EXCISION;  Surgeon: Earlie Server, MD;  Location: [place];  Service: Orthopedics;  Laterality: Right;  . SHOULDER ARTHROSCOPY WITH ROTATOR CUFF REPAIR AND SUBACROMIAL DECOMPRESSION Right 07/06/2019   Procedure: SHOULDER ARTHROSCOPY WITH OPEN ROTATOR CUFF REPAIR, SUBACROMIAL DECOMPRESSION WITH EXTENSIVE DEBRIDEMENT;  Surgeon: Earlie Server, MD;  Location: [place];  Service: Orthopedics;  Laterality: Right;  . TONSILLECTOMY     as a child  . TRANSTHORACIC ECHOCARDIOGRAM  03/29/2008   EF 67%, mild mitral and tricuspid valve regurg.    Current Medications: Current Meds  Medication Sig  . acetaminophen (TYLENOL) 500 MG tablet Take 1,000 mg by mouth every 6 (six) hours as needed.  Marland Kitchen amiodarone (PACERONE) 200 MG tablet Take 0.5 tablets (100 mg total) by mouth daily.  . celecoxib (CELEBREX) 200 MG capsule Take 200  mg by mouth daily.  . fexofenadine (ALLEGRA) 180 MG tablet Take 180 mg by mouth daily.  . fluticasone (FLONASE) 50 MCG/ACT nasal spray Place into both nostrils daily.  . Glucos-Chondroit-Hyaluron-MSM (GLUCOSAMINE CHONDROITIN JOINT PO) Take 2 tablets by mouth 2 (two) times daily.  . lansoprazole (PREVACID) 15 MG capsule Take 15 mg by mouth at bedtime.  Marland Kitchen losartan (COZAAR) 50 MG tablet TAKE 1  TABLET BY MOUTH TWICE A DAY  . Multiple Vitamin (MULTIVITAMIN) tablet Take 1 tablet by mouth daily.  Marland Kitchen oxyCODONE (OXY IR/ROXICODONE) 5 MG immediate release tablet Take one tab po q4-6hrs prn pain, may need 1-2 first couple weeks  . rivaroxaban (XARELTO) 20 MG TABS tablet TAKE 1 TABLET (20 MG TOTAL) BY MOUTH DAILY WITH SUPPER.  . sertraline (ZOLOFT) 100 MG tablet Take 100 mg by mouth daily.  . tadalafil (CIALIS) 5 MG tablet Take 5 mg by mouth daily as needed for erectile dysfunction (enlarged prostate).  . [DISCONTINUED] amiodarone (PACERONE) 200 MG tablet TAKE 1/2 TABLET BY MOUTH DAILY     Allergies:   Penicillins   Social History   Socioeconomic History  . Marital status: Widowed    Spouse name: Not on file  . Number of children: Not on file  . Years of education: Not on file  . Highest education level: Not on file  Occupational History  . Occupation: retired    Comment: [employer]  Tobacco Use  . Smoking status: Former Smoker    Packs/day: 0.25    Years: 0.50    Pack years: 0.12    Types: Cigarettes    Quit date: 01/25/2004    Years since quitting: 16.6  . Smokeless tobacco: Never Used  Vaping Use  . Vaping Use: Never used  Substance and Sexual Activity  . Alcohol use: Not Currently    Alcohol/week: 4.0 standard drinks    Types: 4 Standard drinks or equivalent per week    Comment: couple times a month  . Drug use: No  . Sexual activity: Not Currently  Other Topics Concern  . Not on file  Social History Narrative   One of nine siblings (6 girls and 3 boys). Lost wife to cancer of the sinus approximately ten years ago (2011).    Social Determinants of Health   Financial Resource Strain:   . Difficulty of Paying Living Expenses: Not on file  Food Insecurity:   . Worried About Charity fundraiser in the Last Year: Not on file  . Ran Out of Food in the Last Year: Not on file  Transportation Needs:   . Lack of Transportation (Medical): Not on file  . Lack of Transportation  (Non-Medical): Not on file  Physical Activity:   . Days of Exercise per Week: Not on file  . Minutes of Exercise per Session: Not on file  Stress:   . Feeling of Stress : Not on file  Social Connections:   . Frequency of Communication with Friends and Family: Not on file  . Frequency of Social Gatherings with Friends and Family: Not on file  . Attends Religious Services: Not on file  . Active Member of Clubs or Organizations: Not on file  . Attends Archivist Meetings: Not on file  . Marital Status: Not on file     Family History: The patient's family history includes Alopecia in his daughter; Alzheimer's disease in his father; Brain cancer in his sister; Diabetes in his mother; Heart disease in his mother and sister;  Kidney failure in his mother; Lung cancer in his brother; Prostate cancer in his father; Tremor in his daughter.  ROS:   Please see the history of present illness.    All other systems reviewed and are negative.  EKGs/Labs/Other Studies Reviewed:    The following studies were reviewed today:  Echo 02/2014 - Left ventricle: The cavity size was normal. Wall thickness  was increased in a pattern of mild LVH. Systolic function  was normal. The estimated ejection fraction was in the  range of 50% to 55%.  - Mitral valve: Mild to moderate regurgitation.  - Left atrium: The atrium was mildly dilated.  - Right atrium: The atrium was mildly dilated.    EKG:  EKG is  ordered today.  The ekg ordered today demonstrates sinus bradycardia  Recent Labs: No results found for requested labs within last 8760 hours.  Recent Lipid Panel    Component Value Date/Time   CHOL 145 12/10/2015 1330   TRIG 77 12/10/2015 1330   HDL 45 12/10/2015 1330   CHOLHDL 3.2 12/10/2015 1330   VLDL 15 12/10/2015 1330   LDLCALC 85 12/10/2015 1330     Physical Exam:    VS:  BP 140/78   Pulse (!) 44   Ht 6' (1.829 m)   Wt 219 lb (99.3 kg)   SpO2 98%   BMI 29.70 kg/m      Wt Readings from Last 3 Encounters:  10/02/20 219 lb (99.3 kg)  08/21/20 218 lb 9.6 oz (99.2 kg)  12/01/19 230 lb (104.3 kg)     GEN: Well nourished, well developed in no acute distress HEENT: Normal NECK: No JVD; No carotid bruits LYMPHATICS: No lymphadenopathy CARDIAC: RRR, no murmurs, rubs, gallops RESPIRATORY:  Clear to auscultation without rales, wheezing or rhonchi  ABDOMEN: Soft, non-tender, non-distended MUSCULOSKELETAL:  No edema; No deformity  SKIN: Warm and dry NEUROLOGIC:  Alert and oriented x 3 PSYCHIATRIC:  Normal affect   ASSESSMENT and PLAN:    1. Paroxysmal atrial fibrillation Maintaining sinus rhythm with slow ventricular rate at 44 bpm.  He has recently noted rare short lasting palpitation.  Patient wants to increase his amiodarone however I am concerned given baseline bradycardia in the low 40s.  Compliant with Xarelto and amiodarone.  Patient is long due for LFTs and TSH check.  Given palpitation wanted to check out his lab as well.  However patient wants to do his labs during preop lab work.  We have already placed order for this and he will do during preop lab. PRN cardizem for sustained tachycardia.   2.  Hyperlipidemia -Due for lab -Patient prefers fasting labs during preop blood work  - Continue statin   3. RA - Not able to afford medications  4. Surgical clearance - He is easily getting > 4 Mets of activity. Given past medical history and time since last visit, based on ACC/AHA guidelines, Nathan Mcclure would be at acceptable risk for the planned procedure without further cardiovascular testing.   The patient was advised that if he develops new symptoms prior to surgery to contact our office to arrange for a follow-up visit, and he verbalized understanding.  I will route this recommendation to the requesting party via Epic fax function and remove from pre-op pool.  Please call with questions.  Per Pharmacist   "Patient with diagnosis of  afib on Xarelto for anticoagulation.    Procedure: Radioactive seed implant for prostate cancer Date of procedure: 10/25/20  CHADS2-VASc score  of 3 (age, HTN, CAD)  CrCl >134mL/min Platelet count 228K  Per office protocol, patient can hold Xarelto for 2-3 days prior to procedure".   Medication Adjustments/Labs and Tests Ordered: Current medicines are reviewed at length with the patient today.  Concerns regarding medicines are outlined above.  Orders Placed This Encounter  Procedures  . EKG 12-Lead   Meds ordered this encounter  Medications  . diltiazem (CARDIZEM) 30 MG tablet    Sig: Take 1 tablet (30 mg total) by mouth 4 (four) times daily as needed (PALPITATIONS).    Dispense:  30 tablet    Refill:  1  . amiodarone (PACERONE) 200 MG tablet    Sig: Take 0.5 tablets (100 mg total) by mouth daily.    Dispense:  45 tablet    Refill:  1    PLEASE SCHEDULE AN APPT FOR FUTURE REFILLS    Patient Instructions  Medication Instructions:  Your physician has recommended you make the following change in your medication:  1.  START Cardizem 30 mg taking 1 tablet every 6 hours only as needed for palpitations.   *If you need a refill on your cardiac medications before your next appointment, please call your pharmacy*   Lab Work: None ordered today.  When you go for your lab work for your surgery, please ask them to add a TSH and LIPID (you have to be fasting for this).  The order is already in [place].    If you have labs (blood work) drawn today and your tests are completely normal, you will receive your results only by: Marland Kitchen MyChart Message (if you have MyChart) OR . A paper copy in the mail If you have any lab test that is abnormal or we need to change your treatment, we will call you to review the results.   Testing/Procedures: None ordered   Follow-Up: At [facility], you and your health needs are our priority.  As part of our continuing mission to provide you with  exceptional heart care, we have created designated Provider Care Teams.  These Care Teams include your primary Cardiologist (physician) and Advanced Practice Providers (APPs -  Physician Assistants and Nurse Practitioners) who all work together to provide you with the care you need, when you need it.  We recommend signing up for the patient portal called "MyChart".  Sign up information is provided on this After Visit Summary.  MyChart is used to connect with patients for Virtual Visits (Telemedicine).  Patients are able to view lab/test results, encounter notes, upcoming appointments, etc.  Non-urgent messages can be sent to your provider as well.   To learn more about what you can do with MyChart, go to [email or web address].    Your next appointment:   3 month(s)  The format for your next appointment:   In Person  Provider:   Shelva Majestic, MD   Other Instructions      Signed, Leanor Kail, PA  10/02/2020 12:14 PM    [place]

## 2020-10-09 ENCOUNTER — Other Ambulatory Visit: Payer: Self-pay

## 2020-10-09 ENCOUNTER — Encounter (HOSPITAL_BASED_OUTPATIENT_CLINIC_OR_DEPARTMENT_OTHER): Payer: Self-pay | Admitting: Urology

## 2020-10-09 ENCOUNTER — Other Ambulatory Visit: Payer: Self-pay | Admitting: *Deleted

## 2020-10-09 DIAGNOSIS — I2510 Atherosclerotic heart disease of native coronary artery without angina pectoris: Secondary | ICD-10-CM

## 2020-10-09 DIAGNOSIS — I10 Essential (primary) hypertension: Secondary | ICD-10-CM

## 2020-10-09 DIAGNOSIS — I480 Paroxysmal atrial fibrillation: Secondary | ICD-10-CM

## 2020-10-09 DIAGNOSIS — Z01818 Encounter for other preprocedural examination: Secondary | ICD-10-CM

## 2020-10-09 NOTE — Progress Notes (Signed)
Spoke w/ via phone for pre-op interview---pt Lab needs dos----  none             COVID test ------10-22-2020 1430 Arrive at -------945  10-25-2020 NPO after MN NO Solid Food.  Clear liquids from MN until---845 am then npo Medications to take morning of surgery -----amiodarone, sertraline, diltiazem, prn flonase prn, oxycodone Diabetic medication -----n/a Patient Special Instructions -----fleets enema am of surgery Pre-Op special Istructions -----none Patient verbalized understanding of instructions that were given at this phone interview. Patient denies shortness of breath, chest pain, fever, cough at this phone interview.  Anesthesia Review: no  PCP: dr Thayer Jew pharr Cardiologist : dr Shelva Majestic, cardiac clearance note bhavinkumar bhagat pa 10-02-2020 epic Chest x-ray :09-27-2020 epic EKG :10-02-2020 epic Echo :02-23-2014 epic Stress test: none Cardiac Cath : 2005 Activity level: can climb steps without difficulty has vey physical active full time job Sleep Study/ CPAP :does not tolerate cpap Fasting Blood Sugar :      / Checks Blood Sugar -- times a day:  n/a Blood Thinner/ Instructions /Last Dose:pt instructed by bhavinkumar pa to stop xarelto 3 days before surgery ASA / Instructions/ Last Dose : n/a

## 2020-10-16 ENCOUNTER — Telehealth: Payer: Self-pay | Admitting: *Deleted

## 2020-10-16 DIAGNOSIS — R3121 Asymptomatic microscopic hematuria: Secondary | ICD-10-CM | POA: Diagnosis not present

## 2020-10-16 DIAGNOSIS — C61 Malignant neoplasm of prostate: Secondary | ICD-10-CM | POA: Diagnosis not present

## 2020-10-16 NOTE — Telephone Encounter (Signed)
CALLED PATIENT TO REMIND  OF LAB AND COVID TESTING FOR 10-22-20, SPOKE WITH PATIENT AND HE IS AWARE OF THESE APPTS.

## 2020-10-22 ENCOUNTER — Other Ambulatory Visit: Payer: Self-pay

## 2020-10-22 ENCOUNTER — Encounter (HOSPITAL_COMMUNITY)
Admission: RE | Admit: 2020-10-22 | Discharge: 2020-10-22 | Disposition: A | Discharge status: Home / Self Care | Payer: BC Managed Care – PPO | Source: Ambulatory Visit | Attending: Urology | Admitting: Urology

## 2020-10-22 ENCOUNTER — Other Ambulatory Visit (HOSPITAL_COMMUNITY)
Admission: RE | Admit: 2020-10-22 | Discharge: 2020-10-22 | Disposition: A | Discharge status: Home / Self Care | Payer: Medicare HMO | Source: Ambulatory Visit | Attending: Urology | Admitting: Urology

## 2020-10-22 DIAGNOSIS — Z20822 Contact with and (suspected) exposure to covid-19: Secondary | ICD-10-CM | POA: Diagnosis not present

## 2020-10-22 DIAGNOSIS — Z01812 Encounter for preprocedural laboratory examination: Secondary | ICD-10-CM | POA: Insufficient documentation

## 2020-10-22 LAB — CBC
HCT: 41.1 % (ref 39.0–52.0)
Hemoglobin: 13.4 g/dL (ref 13.0–17.0)
MCH: 30.7 pg (ref 26.0–34.0)
MCHC: 32.6 g/dL (ref 30.0–36.0)
MCV: 94.3 fL (ref 80.0–100.0)
Platelets: 230 10*3/uL (ref 150–400)
RBC: 4.36 MIL/uL (ref 4.22–5.81)
RDW: 12.8 % (ref 11.5–15.5)
WBC: 7.5 10*3/uL (ref 4.0–10.5)
nRBC: 0 % (ref 0.0–0.2)

## 2020-10-22 LAB — PROTIME-INR
INR: 1 (ref 0.8–1.2)
Prothrombin Time: 12.7 seconds (ref 11.4–15.2)

## 2020-10-22 LAB — LIPID PANEL
Cholesterol: 258 mg/dL — ABNORMAL HIGH (ref 0–200)
HDL: 45 mg/dL (ref >40)
LDL Cholesterol: 196 mg/dL — ABNORMAL HIGH (ref 0–99)
Total CHOL/HDL Ratio: 5.7 RATIO
Triglycerides: 84 mg/dL (ref <150)
VLDL: 17 mg/dL (ref 0–40)

## 2020-10-22 LAB — COMPREHENSIVE METABOLIC PANEL
ALT: 14 U/L (ref 0–44)
AST: 17 U/L (ref 15–41)
Albumin: 3.9 g/dL (ref 3.5–5.0)
Alkaline Phosphatase: 77 U/L (ref 38–126)
Anion gap: 10 (ref 5–15)
BUN: 23 mg/dL (ref 8–23)
CO2: 25 mmol/L (ref 22–32)
Calcium: 8.8 mg/dL — ABNORMAL LOW (ref 8.9–10.3)
Chloride: 109 mmol/L (ref 98–111)
Creatinine, Ser: 0.93 mg/dL (ref 0.61–1.24)
GFR, Estimated: >60 mL/min (ref >60)
Glucose, Bld: 104 mg/dL — ABNORMAL HIGH (ref 70–99)
Potassium: 4 mmol/L (ref 3.5–5.1)
Sodium: 144 mmol/L (ref 135–145)
Total Bilirubin: 0.9 mg/dL (ref 0.3–1.2)
Total Protein: 7.1 g/dL (ref 6.5–8.1)

## 2020-10-22 LAB — TSH: TSH: 1.899 u[IU]/mL (ref 0.350–4.500)

## 2020-10-22 LAB — APTT: aPTT: 31 seconds (ref 24–36)

## 2020-10-23 LAB — SARS CORONAVIRUS 2 (TAT 6-24 HRS): SARS Coronavirus 2: NEGATIVE

## 2020-10-24 ENCOUNTER — Telehealth: Payer: Self-pay | Admitting: *Deleted

## 2020-10-24 NOTE — Telephone Encounter (Signed)
Called patient to remind of  procedure for 10-25-20, spoke with patient and he is aware of this procedure

## 2020-10-24 NOTE — H&P (Signed)
I have prostate cancer.     10/16/20: Patient with below noted history. He returns today for preoperative assessment. He is scheduled for brachytherapy seed implant with SpaceOAR on 11/4. He is currently on Xarelto. Per review of his chart, he was seen and evaluated by Cardiology in the interim. Clearance was given to hold Xarelto 72 hours prior to his procedure. He denies any changes to his urological or overall health history since being seen last. He denies any current dysuria or exacerbation of voiding symptoms. No recent complaints of fever, chills, nausea, vomiting, diarrhea, shortness of breath, or chest pain. He was given Cardizem at cardiology visit to use on p.r.n. basis. He has not needed to use this yet.   08/01/20: Nathan Mcclure returns today in f/u to discuss the results of his recent prostate biopsy. He was biopsied for a right prostate nodule and elevated PSA.   Prostate Volume: 39.45m   Path: GG2 in 20% of RMB core, 40% of RMM core and 40% of RML core.  GG1 in 10% of RAM core.   [facility] nomogram: 59% OCD, 45% ECE, 4% LNI and 2% SVI probabilities predicted. 544yrFS 84%, 1050yrS 73%.   IPSS is 13   SHIM is 24.    ALLERGIES: Penicillins    MEDICATIONS: Allegra Allergy  Amiodarone Hcl 1 PO Daily  Celebrex 200 mg capsule  Losartan Potassium 50 mg tablet  Multi-Day Vitamins  Oxycodone Hcl 5 mg tablet PRN  Sertraline Hcl 100 mg tablet  Tadalafil 5 mg tablet 1 tablet PO Daily  Xarelto 20 mg tablet     Notes: Embrel   GU PSH: ESWL, Right - 05/09/2019, 2017 Prostate Needle Biopsy - 07/11/2020       PSH Notes: Neck Surgery, Back Surgery   NON-GU PSH: Rotator cuff surgery, Right Surgical Pathology, Gross And Microscopic Examination For Prostate Needle - 07/11/2020     GU PMH: Prostate Cancer, He has T2a Nx Mx Gleason 7(3+4) favorable intermediate risk prostate cancer with a 69m27mostate, moderate LUTS and ED that responds to PDE5's. He has chronic afib but is otherwise healthy. I  discussed AS, RALP, EXRT, Seeds, Cryo and HIFU and he is most interested in a seed implant. I will get him set up to see [name]8/10/2020 Weak Urinary Stream (Stable) - 08/01/2020, (Worsening), - 11/08/2018 (Stable), - 2018 Elevated PSA - 07/11/2020, Elevated prostate specific antigen (PSA), - 2014 Prostate nodule w/ LUTS (Stable), Stable but still present right sided nodule. Voiding symptoms stable other than some morning hesitancy. I refilled daily his tadalafil today. - 05/22/2020, He has a new right prostate nodule. I will get a PSA today and have him return for prostate US [place] biopsy. risks of bleeding, infection and voiding difficulty sent. Levaquin sent. He will need to be cleared by [name] Billingscome off of Xarelto. I am going to give him tadalafil daily for his LUTS and ED. , - 11/08/2018 Renal calculus (Stable), CT imaging last year noted a small non-obstructing stone in the left lower pole. He's had no interval s/s of an obstructing stone. UA with MH today which has been a chronic finding for him. He will need repeat imaging in the near future but his known elevated PSA with nodule noted on DRE will take precedent at this time. - 05/22/2020, Stable left renal stone., - 2020 Urinary Hesitancy - 05/22/2020 Ureteral calculus - 05/23/2019, The stone is moving but is still in the proximal ureter. I discussed MET, URS and  ESWL and he would like to continue with medical therapy for now. He will return in 2 weeks with repeat imaging. , - 2020, Right, He has 2 calcifications on the right that area about 4 mm. One is at the UPJ area and the other is in the area of the distal ureter. Either could be a stone. I am going to put him on tamsulosin and have him return in 2 weeks for a KUB. He will hold the Xarelto if the bleeding persists. , - 2020 (Stable, Acute), Left, - 2017, Ureteral Stone, - 2014 Microscopic hematuria, 3-10 RBC's today. w/u indicated with CT and cystoscopy. - 11/08/2018, - 2017 Primary  hypogonadism, I will check a testosterone level today. - 11/08/2018, (Improving), His testosterone level has normalized now that he is just on the prn meds. He remains fatigued., - 2018, Hypogonadism, testicular, - 2015 History of urolithiasis, He has 2+ blood on the dip UA today. I have gotten a KUB today and it is negative. - 2018 Gross hematuria - 2017 BPH w/LUTS, Benign prostatic hyperplasia with urinary obstruction - 2015 ED due to arterial insufficiency, Erectile dysfunction due to arterial insufficiency - 2015 Bladder-neck stenosis/contracture, Bladder neck contracture - 2014 Low back pain, Lumbago - 2014 Personal Hx Oth Urinary System diseases, History of hematuria - 2014 Retrograde ejaculation, Retrograde ejaculation - 2014      PMH Notes:  2013-10-15 05:09:35 - Note: Groin (Inguinal) Pain Right Side  2007-02-02 09:11:44 - Note: Arthritis   NON-GU PMH: Bacteriuria, He does have some pyuria and bacteriuria with the hematuria so I will get a culture. - 2020 Encounter for general adult medical examination without abnormal findings, Encounter for preventive health examination - 2015 Personal history of other diseases of the circulatory system, History of atrial fibrillation - 2015, History of hypertension, - 2014 Anxiety, Anxiety - 2014 Decreased libido, Decreased libido - 2014 Personal history of other diseases of the digestive system, History of esophageal reflux - 2014 Personal history of other endocrine, nutritional and metabolic disease, History of hypercholesterolemia - 2014 Personal history of other specified conditions, History of heartburn - 2014    FAMILY HISTORY: Family Health Status Number - Runs In Family Prostate Cancer - Father   SOCIAL HISTORY: Marital Status: Married Preferred Language: English; Ethnicity: Not Hispanic Or Latino; Race: White Current Smoking Status: Patient does not smoke anymore.  Does not use smokeless tobacco. Has never drank.  Does not use  drugs. Drinks 3 caffeinated drinks per day. Has not had a blood transfusion.     Notes: Former smoker, Occupation:, Caffeine Use, Marital History - Currently Married, Alcohol Use, Tobacco Use   REVIEW OF SYSTEMS:    GU Review Male:   Patient denies frequent urination, hard to postpone urination, burning/ pain with urination, get up at night to urinate, leakage of urine, stream starts and stops, trouble starting your stream, have to strain to urinate , erection problems, and penile pain.  Gastrointestinal (Upper):   Patient denies nausea, vomiting, and indigestion/ heartburn.  Gastrointestinal (Lower):   Patient denies diarrhea and constipation.  Constitutional:   Patient denies fever, night sweats, weight loss, and fatigue.  Skin:   Patient denies skin rash/ lesion and itching.  Eyes:   Patient denies blurred vision and double vision.  Ears/ Nose/ Throat:   Patient denies sore throat and sinus problems.  Hematologic/Lymphatic:   Patient denies swollen glands and easy bruising.  Cardiovascular:   Patient denies leg swelling and chest pains.  Respiratory:   Patient  denies cough and shortness of breath.  Endocrine:   Patient denies excessive thirst.  Musculoskeletal:   Patient reports joint pain. Patient denies back pain.  Neurological:   Patient denies headaches and dizziness.  Psychologic:   Patient denies depression and anxiety.   VITAL SIGNS:      10/16/2020 10:02 AM  Weight 215 lb / 97.52 kg  Height 72 in / 182.88 cm  BP 137/80 mmHg  Heart Rate 62 /min  Temperature 98.0 F / 36.6 C  BMI 29.2 kg/m   GU PHYSICAL EXAMINATION:    Seminal Vesicles: Nonpalpable.   MULTI-SYSTEM PHYSICAL EXAMINATION:    Constitutional: Well-nourished. No physical deformities. Normally developed. Good grooming.  Respiratory: Normal breath sounds. No labored breathing, no use of accessory muscles.   Cardiovascular: Regular rate and rhythm. No murmur, no gallop. Normal temperature, normal extremity  pulses, no swelling, no varicosities.   Neurologic / Psychiatric: Oriented to time, oriented to place, oriented to person. No depression, no anxiety, no agitation.  Gastrointestinal: No mass, no tenderness, no rigidity, non obese abdomen.  Musculoskeletal: Normal gait and station of head and neck.     Complexity of Data:  Lab Test Review:   PSA  Records Review:   Pathology Reports, Previous Patient Records  Urine Test Review:   Urinalysis, Urine Culture   05/22/20 03/30/19 11/08/18 12/08/16 09/30/13 12/29/11 02/26/11 11/01/10  PSA  Total PSA 7.82 ng/mL 4.86 ng/mL 4.85 ng/mL 3.60 ng/dl 3.38  2.03  2.32  1.69   Free PSA 1.03 ng/mL 0.99 ng/mL 0.77 ng/mL       % Free PSA 13 % PSA 20 % PSA 16 % PSA         12/08/16 11/10/14 10/01/13 12/29/11 02/26/11 12/18/10 11/20/10 11/01/10  Hormones  Testosterone, Total 354.7 pg/dL 391  162  243.21  427.29  240.16  163.63  134.38     PROCEDURES:          Urinalysis w/Scope - 81001 Dipstick Dipstick Cont'd Micro  Color: Yellow Bilirubin: Neg WBC/hpf: NS (Not Seen)  Appearance: Clear Ketones: Neg RBC/hpf: 3 - 10/hpf  Specific Gravity: 1.025 Blood: 2+ Bacteria: Rare (0-9/hpf)  pH: 6.0 Protein: Neg Cystals: NS (Not Seen)  Glucose: Neg Urobilinogen: 0.2 Casts: NS (Not Seen)    Nitrites: Neg Trichomonas: Not Present    Leukocyte Esterase: Neg Mucous: Present      Epithelial Cells: 0 - 5/hpf      Yeast: NS (Not Seen)      Sperm: Not Present    Notes:      ASSESSMENT:      ICD-10 Details  1 GU:   Prostate Cancer - C61   2   Microscopic hematuria - R31.21    PLAN:           Orders Labs Urine Culture          Document Letter(s):  Created for Patient: Clinical Summary         Notes:   Precautionary culture was sent today. We reviewed his upcoming procedure in detail today and I answered all of his questions to the best my ability. Preoperative instructions were again reviewed. He was cleared by cardiology to hold Xarelto for 72 hours prior  to his procedure. He will plan to follow up as scheduled for planned surgical procedure. Return precautions reviewed in the interim.        Next Appointment:      Next Appointment: 10/25/2020 11:45 AM    Appointment Type: Surgery  Location: [facility] [phone]    Provider: Irine Seal, M.D.    Reason for Visit: OP [place]

## 2020-10-25 ENCOUNTER — Other Ambulatory Visit: Payer: Self-pay

## 2020-10-25 ENCOUNTER — Ambulatory Visit (HOSPITAL_BASED_OUTPATIENT_CLINIC_OR_DEPARTMENT_OTHER)
Admission: RE | Admit: 2020-10-25 | Discharge: 2020-10-25 | Disposition: A | Discharge status: Home / Self Care | Payer: BC Managed Care – PPO | Attending: Urology | Admitting: Urology

## 2020-10-25 ENCOUNTER — Ambulatory Visit (HOSPITAL_COMMUNITY): Payer: BC Managed Care – PPO

## 2020-10-25 ENCOUNTER — Ambulatory Visit (HOSPITAL_BASED_OUTPATIENT_CLINIC_OR_DEPARTMENT_OTHER): Payer: BC Managed Care – PPO | Admitting: Anesthesiology

## 2020-10-25 ENCOUNTER — Encounter (HOSPITAL_BASED_OUTPATIENT_CLINIC_OR_DEPARTMENT_OTHER): Payer: Self-pay | Admitting: Urology

## 2020-10-25 ENCOUNTER — Encounter (HOSPITAL_BASED_OUTPATIENT_CLINIC_OR_DEPARTMENT_OTHER)
Admission: RE | Disposition: A | Discharge status: Home / Self Care | Payer: Self-pay | Source: Home / Self Care | Attending: Urology

## 2020-10-25 DIAGNOSIS — R3129 Other microscopic hematuria: Secondary | ICD-10-CM | POA: Diagnosis not present

## 2020-10-25 DIAGNOSIS — C61 Malignant neoplasm of prostate: Secondary | ICD-10-CM | POA: Diagnosis not present

## 2020-10-25 DIAGNOSIS — Z87891 Personal history of nicotine dependence: Secondary | ICD-10-CM | POA: Insufficient documentation

## 2020-10-25 DIAGNOSIS — Z7901 Long term (current) use of anticoagulants: Secondary | ICD-10-CM | POA: Diagnosis not present

## 2020-10-25 DIAGNOSIS — E785 Hyperlipidemia, unspecified: Secondary | ICD-10-CM | POA: Diagnosis not present

## 2020-10-25 DIAGNOSIS — I10 Essential (primary) hypertension: Secondary | ICD-10-CM | POA: Diagnosis not present

## 2020-10-25 DIAGNOSIS — I2510 Atherosclerotic heart disease of native coronary artery without angina pectoris: Secondary | ICD-10-CM | POA: Diagnosis not present

## 2020-10-25 HISTORY — PX: CYSTOSCOPY: SHX5120

## 2020-10-25 HISTORY — DX: Essential tremor: G25.0

## 2020-10-25 HISTORY — PX: SPACE OAR INSTILLATION: SHX6769

## 2020-10-25 HISTORY — PX: RADIOACTIVE SEED IMPLANT: SHX5150

## 2020-10-25 SURGERY — INSERTION, RADIATION SOURCE, PROSTATE
Anesthesia: General | Site: Rectum

## 2020-10-25 MED ORDER — ONDANSETRON HCL 4 MG/2ML IJ SOLN
INTRAMUSCULAR | Status: AC
  Filled 2020-10-25: qty 2

## 2020-10-25 MED ORDER — OXYCODONE HCL 5 MG PO TABS: 5.0000 mg | ORAL_TABLET | ORAL | Status: DC | PRN

## 2020-10-25 MED ORDER — CIPROFLOXACIN IN D5W 400 MG/200ML IV SOLN
INTRAVENOUS | Status: AC
  Filled 2020-10-25: qty 200

## 2020-10-25 MED ORDER — OXYCODONE HCL 5 MG PO TABS: 5.0000 mg | ORAL_TABLET | Freq: Once | ORAL | Status: DC | PRN

## 2020-10-25 MED ORDER — FENTANYL CITRATE (PF) 100 MCG/2ML IJ SOLN: 25.0000 ug | INTRAMUSCULAR | Status: DC | PRN

## 2020-10-25 MED ORDER — FENTANYL CITRATE (PF) 100 MCG/2ML IJ SOLN
INTRAMUSCULAR | Status: AC
  Filled 2020-10-25: qty 2

## 2020-10-25 MED ORDER — PROPOFOL 10 MG/ML IV BOLUS
INTRAVENOUS | Status: AC
  Filled 2020-10-25: qty 20

## 2020-10-25 MED ORDER — SODIUM CHLORIDE 0.9 % IV SOLN
INTRAVENOUS | Status: AC | PRN
  Administered 2020-10-25: 10 mL

## 2020-10-25 MED ORDER — OXYCODONE HCL 5 MG/5ML PO SOLN: 5.0000 mg | Freq: Once | ORAL | Status: DC | PRN

## 2020-10-25 MED ORDER — DEXAMETHASONE SODIUM PHOSPHATE 10 MG/ML IJ SOLN
INTRAMUSCULAR | Status: AC
  Filled 2020-10-25: qty 1

## 2020-10-25 MED ORDER — EPHEDRINE 5 MG/ML INJ
INTRAVENOUS | Status: AC
  Filled 2020-10-25: qty 10

## 2020-10-25 MED ORDER — MIDAZOLAM HCL 2 MG/2ML IJ SOLN
INTRAMUSCULAR | Status: AC
  Filled 2020-10-25: qty 2

## 2020-10-25 MED ORDER — MIDAZOLAM HCL 5 MG/5ML IJ SOLN
INTRAMUSCULAR | Status: DC | PRN
  Administered 2020-10-25: 2 mg via INTRAVENOUS

## 2020-10-25 MED ORDER — SODIUM CHLORIDE 0.9% FLUSH: 3.0000 mL | INTRAVENOUS | Status: DC | PRN

## 2020-10-25 MED ORDER — SODIUM CHLORIDE 0.9 % IV SOLN: 250.0000 mL | INTRAVENOUS | Status: DC | PRN

## 2020-10-25 MED ORDER — DEXAMETHASONE SODIUM PHOSPHATE 10 MG/ML IJ SOLN
INTRAMUSCULAR | Status: DC | PRN
  Administered 2020-10-25: 10 mg via INTRAVENOUS

## 2020-10-25 MED ORDER — ACETAMINOPHEN 325 MG PO TABS: 650.0000 mg | ORAL_TABLET | ORAL | Status: DC | PRN

## 2020-10-25 MED ORDER — IOHEXOL 300 MG/ML  SOLN
INTRAMUSCULAR | Status: DC | PRN
  Administered 2020-10-25: 7 mL

## 2020-10-25 MED ORDER — GLYCOPYRROLATE PF 0.2 MG/ML IJ SOSY
PREFILLED_SYRINGE | INTRAMUSCULAR | Status: AC
  Filled 2020-10-25: qty 1

## 2020-10-25 MED ORDER — FENTANYL CITRATE (PF) 100 MCG/2ML IJ SOLN
INTRAMUSCULAR | Status: DC | PRN
  Administered 2020-10-25 (×2): 50 ug via INTRAVENOUS
  Administered 2020-10-25: 100 ug via INTRAVENOUS

## 2020-10-25 MED ORDER — GLYCOPYRROLATE 0.2 MG/ML IJ SOLN
INTRAMUSCULAR | Status: DC | PRN
  Administered 2020-10-25: .2 mg via INTRAVENOUS

## 2020-10-25 MED ORDER — KETOROLAC TROMETHAMINE 30 MG/ML IJ SOLN
INTRAMUSCULAR | Status: DC | PRN
  Administered 2020-10-25: 30 mg via INTRAVENOUS

## 2020-10-25 MED ORDER — FLEET ENEMA 7-19 GM/118ML RE ENEM: 1.0000 | ENEMA | Freq: Once | RECTAL | Status: DC

## 2020-10-25 MED ORDER — LACTATED RINGERS IV SOLN: INTRAVENOUS | Status: DC

## 2020-10-25 MED ORDER — LIDOCAINE 2% (20 MG/ML) 5 ML SYRINGE
INTRAMUSCULAR | Status: AC
  Filled 2020-10-25: qty 5

## 2020-10-25 MED ORDER — ACETAMINOPHEN 325 MG RE SUPP: 650.0000 mg | RECTAL | Status: DC | PRN

## 2020-10-25 MED ORDER — EPHEDRINE SULFATE 50 MG/ML IJ SOLN
INTRAMUSCULAR | Status: DC | PRN
  Administered 2020-10-25 (×2): 10 mg via INTRAVENOUS

## 2020-10-25 MED ORDER — CIPROFLOXACIN IN D5W 400 MG/200ML IV SOLN
400.0000 mg | INTRAVENOUS | Status: AC
  Administered 2020-10-25: 400 mg via INTRAVENOUS

## 2020-10-25 MED ORDER — SODIUM CHLORIDE 0.9% FLUSH: 3.0000 mL | Freq: Two times a day (BID) | INTRAVENOUS | Status: DC

## 2020-10-25 MED ORDER — SODIUM CHLORIDE (PF) 0.9 % IJ SOLN
INTRAMUSCULAR | Status: DC | PRN
  Administered 2020-10-25: 150 mL

## 2020-10-25 MED ORDER — ACETAMINOPHEN 10 MG/ML IV SOLN: 1000.0000 mg | Freq: Once | INTRAVENOUS | Status: DC | PRN

## 2020-10-25 MED ORDER — KETOROLAC TROMETHAMINE 30 MG/ML IJ SOLN
INTRAMUSCULAR | Status: AC
  Filled 2020-10-25: qty 1

## 2020-10-25 MED ORDER — LIDOCAINE HCL (CARDIAC) PF 100 MG/5ML IV SOSY
PREFILLED_SYRINGE | INTRAVENOUS | Status: DC | PRN
  Administered 2020-10-25: 100 mg via INTRAVENOUS

## 2020-10-25 MED ORDER — DEXMEDETOMIDINE HCL 200 MCG/2ML IV SOLN
INTRAVENOUS | Status: DC | PRN
  Administered 2020-10-25: 4 ug via INTRAVENOUS

## 2020-10-25 MED ORDER — MORPHINE SULFATE (PF) 4 MG/ML IV SOLN: 2.0000 mg | INTRAVENOUS | Status: DC | PRN

## 2020-10-25 MED ORDER — PROPOFOL 10 MG/ML IV BOLUS
INTRAVENOUS | Status: DC | PRN
  Administered 2020-10-25: 270 mg via INTRAVENOUS

## 2020-10-25 MED ORDER — ONDANSETRON HCL 4 MG/2ML IJ SOLN
INTRAMUSCULAR | Status: DC | PRN
  Administered 2020-10-25: 4 mg via INTRAVENOUS

## 2020-10-25 SURGICAL SUPPLY — 39 items
BAG DRN RND TRDRP ANRFLXCHMBR (UROLOGICAL SUPPLIES) ×3
BAG URINE DRAIN 2000ML AR STRL (UROLOGICAL SUPPLIES) ×4 IMPLANT
BLADE CLIPPER SENSICLIP SURGIC (BLADE) ×4 IMPLANT
CATH FOLEY 2WAY SLVR  5CC 16FR (CATHETERS) ×4
CATH FOLEY 2WAY SLVR 5CC 16FR (CATHETERS) ×3 IMPLANT
CATH ROBINSON RED A/P 16FR (CATHETERS) IMPLANT
CATH ROBINSON RED A/P 20FR (CATHETERS) ×4 IMPLANT
CLOTH BEACON ORANGE TIMEOUT ST (SAFETY) ×4 IMPLANT
CNTNR URN SCR LID CUP LEK RST (MISCELLANEOUS) ×6 IMPLANT
CONT SPEC 4OZ STRL OR WHT (MISCELLANEOUS) ×8
COVER BACK TABLE 60X90IN (DRAPES) ×4 IMPLANT
COVER MAYO STAND STRL (DRAPES) ×4 IMPLANT
DRAPE C-ARM 35X43 STRL (DRAPES) ×4 IMPLANT
DRSG TEGADERM 4X4.75 (GAUZE/BANDAGES/DRESSINGS) ×8 IMPLANT
DRSG TEGADERM 8X12 (GAUZE/BANDAGES/DRESSINGS) ×8 IMPLANT
GLOVE BIO SURGEON STRL SZ 6.5 (GLOVE) ×4 IMPLANT
GLOVE BIO SURGEON STRL SZ7.5 (GLOVE) IMPLANT
GLOVE BIO SURGEON STRL SZ8 (GLOVE) IMPLANT
GLOVE SURG ORTHO 8.5 STRL (GLOVE) ×4 IMPLANT
GLOVE SURG SS PI 6.5 STRL IVOR (GLOVE) IMPLANT
GLOVE SURG SS PI 8.0 STRL IVOR (GLOVE) ×8 IMPLANT
GOWN STRL REUS W/ TWL XL LVL3 (GOWN DISPOSABLE) ×3 IMPLANT
GOWN STRL REUS W/TWL XL LVL3 (GOWN DISPOSABLE) ×8 IMPLANT
HOLDER FOLEY CATH W/STRAP (MISCELLANEOUS) ×4 IMPLANT
IMPL SPACEOAR VUE SYSTEM (Spacer) IMPLANT
IMPLANT SPACEOAR VUE SYSTEM (Spacer) ×4 IMPLANT
IV NS 1000ML (IV SOLUTION) ×4
IV NS 1000ML BAXH (IV SOLUTION) ×3 IMPLANT
KIT TURNOVER CYSTO (KITS) ×4 IMPLANT
MARKER SKIN DUAL TIP RULER LAB (MISCELLANEOUS) ×4 IMPLANT
PACK CYSTO (CUSTOM PROCEDURE TRAY) ×4 IMPLANT
SURGILUBE 2OZ TUBE FLIPTOP (MISCELLANEOUS) IMPLANT
SUT BONE WAX W31G (SUTURE) IMPLANT
SYR 10ML LL (SYRINGE) IMPLANT
TOWEL OR 17X26 10 PK STRL BLUE (TOWEL DISPOSABLE) ×4 IMPLANT
UNDERPAD 30X36 HEAVY ABSORB (UNDERPADS AND DIAPERS) ×8 IMPLANT
WATER STERILE IRR 3000ML UROMA (IV SOLUTION) ×4 IMPLANT
WATER STERILE IRR 500ML POUR (IV SOLUTION) ×4 IMPLANT
i-seed agx100 ×65 IMPLANT

## 2020-10-25 NOTE — Interval H&P Note (Signed)
History and Physical Interval Note:  10/25/2020 10:31 AM  Bryan Lemma  has presented today for surgery, with the diagnosis of PROSTATE CANCER.  The various methods of treatment have been discussed with the patient and family. After consideration of risks, benefits and other options for treatment, the patient has consented to  Procedure(s): RADIOACTIVE SEED IMPLANT/BRACHYTHERAPY IMPLANT (N/A) SPACE OAR INSTILLATION (N/A) as a surgical intervention.  The patient's history has been reviewed, patient examined, no change in status, stable for surgery.  I have reviewed the patient's chart and labs.  Questions were answered to the patient's satisfaction.     Irine Seal

## 2020-10-25 NOTE — Op Note (Signed)
PATIENT:  Nathan Mcclure  PRE-OPERATIVE DIAGNOSIS:  Adenocarcinoma of the prostate  POST-OPERATIVE DIAGNOSIS:  Same  PROCEDURE:  Procedure(s): 1. I-125 radioactive seed implantation 2. SpaceOAR implantation. 3.  Cystoscopy  SURGEON:  Surgeon(s): Irine Seal MD  Radiation oncologist: Dr. Tyler Pita  ANESTHESIA:  General  EBL:  Minimal  DRAINS: 57 French Foley catheter  INDICATION: Nathan Mcclure is a 69 y.o. with Stage T2a, Gleason 7(3+4) prostate cancer who has elected brachytherapy for treatment.  Description of procedure: After informed consent the patient was brought to the major OR, placed on the table and administered general anesthesia. He was then moved to the modified lithotomy position with his perineum perpendicular to the floor. His perineum and genitalia were then sterilely prepped. An official timeout was then performed. A 16 French Foley catheter was then placed in the bladder and filled with dilute contrast, a rectal tube was placed in the rectum and the transrectal ultrasound probe was placed in the rectum and affixed to the stand. He was then sterilely draped.  The sterile grid was installed.   Anchor needles were then placed.   Real time ultrasonography was used along with the seed planning software spot-pro version 3.1-00. This was used to develop the seed plan including the number of needles as well as number of seeds required for complete and adequate coverage. Real-time ultrasonography was then used along with the previously developed plan  to implant a total of 65 seeds using 19 needles for a target dose of 145 Gy. This proceeded without difficulty or complication.  The anchor needles and guide were removed and the SpaceOAR needle was passed under US guidance into the fat stripe posterior to the prostate with the tip in the midline at mid prostate. A puff of NS confirmed appropriate positioning and the SpaceOAR polymer was then injected over 10 seconds into  the space with excellent distribution.     A Foley catheter was then removed as well as the transrectal ultrasound probe and rectal probe. Flexible cystoscopy was then performed using the 17 French flexible scope which revealed a normal urethra throughout its length down to the sphincter which appeared intact. The prostatic urethra was 3cm with bilobar hyperplasia and mild obstruction. The bladder was then entered and fully and systematically inspected. There is mild trabeculation.  The ureteral orifices were noted to be of normal configuration and position. The mucosa revealed no evidence of tumors. There were also no stones identified within the bladder.  No seeds or spacers were seen and/or removed from the bladder.  The cystoscope was then removed.  The drapes were removed.  The perineum was cleaned and dressed.  He was taken out of the lithotomy position and was awakened and taken to recovery room in stable and satisfactory condition. He tolerated procedure well and there were no intraoperative complications.

## 2020-10-25 NOTE — Transfer of Care (Signed)
Immediate Anesthesia Transfer of Care Note  Patient: Nathan Mcclure  Procedure(s) Performed: Procedure(s) (LRB): RADIOACTIVE SEED IMPLANT/BRACHYTHERAPY IMPLANT (N/A) SPACE OAR INSTILLATION (N/A) CYSTOSCOPY (N/A)  Patient Location: PACU  Anesthesia Type: General  Level of Consciousness: awake, sedated, patient cooperative and responds to stimulation  Airway & Oxygen Therapy: Patient Spontanous Breathing and Patient connected to Alvo 02 and soft FM   Post-op Assessment: Report given to PACU RN, Post -op Vital signs reviewed and stable and Patient moving all extremities  Post vital signs: Reviewed and stable  Complications: No apparent anesthesia complications

## 2020-10-25 NOTE — Anesthesia Preprocedure Evaluation (Signed)
Anesthesia Evaluation  Patient identified by MRN, date of birth, ID band Patient awake    Reviewed: Allergy & Precautions, NPO status , Patient's Chart, lab work & pertinent test results  Airway Mallampati: II  TM Distance: >3 FB Neck ROM: Full    Dental no notable dental hx.    Pulmonary sleep apnea , former smoker,    Pulmonary exam normal breath sounds clear to auscultation       Cardiovascular hypertension, Normal cardiovascular exam+ dysrhythmias Atrial Fibrillation  Rhythm:Regular Rate:Normal     Neuro/Psych negative neurological ROS  negative psych ROS   GI/Hepatic negative GI ROS, Neg liver ROS,   Endo/Other  negative endocrine ROS  Renal/GU negative Renal ROS  negative genitourinary   Musculoskeletal  (+) Arthritis , Rheumatoid disorders,    Abdominal   Peds negative pediatric ROS (+)  Hematology negative hematology ROS (+)   Anesthesia Other Findings   Reproductive/Obstetrics negative OB ROS                             Anesthesia Physical Anesthesia Plan  ASA: III  Anesthesia Plan: General   Post-op Pain Management:    Induction: Intravenous  PONV Risk Score and Plan: 2 and Ondansetron, Dexamethasone and Treatment may vary due to age or medical condition  Airway Management Planned: LMA  Additional Equipment:   Intra-op Plan:   Post-operative Plan: Extubation in OR  Informed Consent: I have reviewed the patients History and Physical, chart, labs and discussed the procedure including the risks, benefits and alternatives for the proposed anesthesia with the patient or authorized representative who has indicated his/her understanding and acceptance.     Dental advisory given  Plan Discussed with: CRNA and Surgeon  Anesthesia Plan Comments:         Anesthesia Quick Evaluation

## 2020-10-25 NOTE — Anesthesia Procedure Notes (Signed)
Procedure Name: LMA Insertion Date/Time: 10/25/2020 12:05 PM Performed by: Justice Rocher, CRNA Pre-anesthesia Checklist: Patient identified, Emergency Drugs available, Suction available, Patient being monitored and Timeout performed Patient Re-evaluated:Patient Re-evaluated prior to induction Oxygen Delivery Method: Circle system utilized Preoxygenation: Pre-oxygenation with 100% oxygen Induction Type: IV induction Ventilation: Mask ventilation without difficulty LMA: LMA inserted LMA Size: 5.0 Number of attempts: 1 Airway Equipment and Method: Bite block Placement Confirmation: positive ETCO2,  breath sounds checked- equal and bilateral and CO2 detector Tube secured with: Tape Dental Injury: Teeth and Oropharynx as per pre-operative assessment

## 2020-10-25 NOTE — Discharge Instructions (Signed)
Brachytherapy for Prostate Cancer, Care After ° °This sheet gives you information about how to care for yourself after your procedure. Your health care provider may also give you more specific instructions. If you have problems or questions, contact your health care provider. °What can I expect after the procedure? °After the procedure, it is common to have: °· Trouble passing urine. °· Blood in the urine or semen. °· Constipation. °· Frequent feeling of an urgent need to urinate. °· Bruising, swelling, and tenderness of the area behind the scrotum (perineum). °· Bloating and gas. °· Fatigue. °· Burning or pain in the rectum. °· Problems getting or keeping an erection (erectile dysfunction). °· Nausea. °Follow these instructions at home: °Managing pain, stiffness, and swelling °· If directed, apply ice to the affected area: °? Put ice in a plastic bag. °? Place a towel between your skin and the bag. °? Leave the ice on for 20 minutes, 2-3 times a day. °· Try not to sit directly on the area behind the scrotum. A soft cushion can help with discomfort. °Activity °· Do not drive for 24 hours if you were given a medicine to help you relax (sedative). °· Do not drive or use heavy machinery while taking prescription pain medicine. °· Rest as told by your health care provider. °· Most people can return to normal activities a few days or weeks after the procedure. Ask your health care provider what activities are safe for you. °Eating and drinking °· Drink enough fluid to keep your urine clear or pale yellow. °· Eat a healthy, balanced diet. This includes lean proteins, whole grains, and plenty of fruits and vegetables. °General instructions °· Take over-the-counter and prescription medicines only as told by your health care provider. °· Keep all follow-up visits as told by your health care provider. This is important. You may still need additional treatment. °· Do not take baths, swim, or use a hot tub until your health  care provider approves. Shower and wash the area behind the scrotum gently. °· Do not have sex for one week after the treatment, or until your health care provider approves. °· If you have permanent, low-dose brachytherapy implants: °? Limit close contact with children and pregnant women for 2 months or as told by your health care provider. This is important because of the radiation that is still active in the prostate. °? You may set off radioactive sensors, such as airport screenings. Ask your health care provider for a document that explains your treatment. °? You may be instructed to use a condom during sex for the first 2 months after low-dose brachytherapy. °Contact a health care provider if: °· You have a fever or chills. °· You do not have a bowel movement for 3-4 days after the procedure. °· You have diarrhea for 3-4 days after the procedure. °· You develop any new symptoms, such as problems with urinating or erectile dysfunction. °· You have abdomen (abdominal) pain. °· You have more blood in your urine. °Get help right away if: °· You cannot urinate. °· There is excessive bleeding from your rectum. °· You have unusual drainage coming from your rectum. °· You have severe pain in the treated area that does not go away with pain medicine. °· You have severe nausea or vomiting. °Summary °· If you have permanent, low-dose brachytherapy implants, limit close contact with children and pregnant women for 2 months or as told by your health care provider. This is important because of the radiation   that is still active in the prostate.  Talk with your health care provider about your risk of brachytherapy side effects, such as erectile dysfunction or urinary problems. Your health care provider will be able to recommend possible treatment options.  Keep all follow-up visits as told by your health care provider. This is important. You may need additional treatment. This information is not intended to replace  advice given to you by your health care provider. Make sure you discuss any questions you have with your health care provider. Document Revised: 11/20/2017 Document Reviewed: 01/09/2017 Elsevier Patient Education  [place] Instructions  Activity: Get plenty of rest for the remainder of the day. A responsible individual must stay with you for 24 hours following the procedure.  For the next 24 hours, DO NOT: -Drive a car -Paediatric nurse -Drink alcoholic beverages -Take any medication unless instructed by your physician -Make any legal decisions or sign important papers.  Meals: Start with liquid foods such as gelatin or soup. Progress to regular foods as tolerated. Avoid greasy, spicy, heavy foods. If nausea and/or vomiting occur, drink only clear liquids until the nausea and/or vomiting subsides. Call your physician if vomiting continues.  Special Instructions/Symptoms: Your throat may feel dry or sore from the anesthesia or the breathing tube placed in your throat during surgery. If this causes discomfort, gargle with warm salt water. The discomfort should disappear within 24 hours.  If you had a scopolamine patch placed behind your ear for the management of post- operative nausea and/or vomiting:  1. The medication in the patch is effective for 72 hours, after which it should be removed.  Wrap patch in a tissue and discard in the trash. Wash hands thoroughly with soap and water. 2. You may remove the patch earlier than 72 hours if you experience unpleasant side effects which may include dry mouth, dizziness or visual disturbances. 3. Avoid touching the patch. Wash your hands with soap and water after contact with the patch.

## 2020-10-26 ENCOUNTER — Encounter (HOSPITAL_BASED_OUTPATIENT_CLINIC_OR_DEPARTMENT_OTHER): Payer: Self-pay | Admitting: Urology

## 2020-10-30 DIAGNOSIS — M4712 Other spondylosis with myelopathy, cervical region: Secondary | ICD-10-CM | POA: Diagnosis not present

## 2020-10-30 DIAGNOSIS — G894 Chronic pain syndrome: Secondary | ICD-10-CM | POA: Diagnosis not present

## 2020-11-04 NOTE — Progress Notes (Signed)
  Radiation Oncology         [phone] ________________________________  Name: Nathan Mcclure MRN: [id number]  Date: 11/04/2020  DOB: March 13, 1951       Prostate Seed Implant  BH:ALPFX, Thayer Jew, MD  No ref. provider found  DIAGNOSIS:  Oncology History   No history exists.    No diagnosis found.  PROCEDURE: Insertion of radioactive I-125 seeds into the prostate gland.  RADIATION DOSE: 145 Gy, definitive therapy.  TECHNIQUE: Nathan Mcclure was brought to the operating room with the urologist. He was placed in the dorsolithotomy position. He was catheterized and a rectal tube was inserted. The perineum was shaved, prepped and draped. The ultrasound probe was then introduced into the rectum to see the prostate gland.  TREATMENT DEVICE: A needle grid was attached to the ultrasound probe stand and anchor needles were placed.  3D PLANNING: The prostate was imaged in 3D using a sagittal sweep of the prostate probe. These images were transferred to the planning computer. There, the prostate, urethra and rectum were defined on each axial reconstructed image. Then, the software created an optimized 3D plan and a few seed positions were adjusted. The quality of the plan was reviewed using [facility] information for the target and the following two organs at risk:  Urethra and Rectum.  Then the accepted plan was printed and handed off to the radiation therapist.  Under my supervision, the custom loading of the seeds and spacers was carried out and loaded into sealed vicryl sleeves.  These pre-loaded needles were then placed into the needle holder.Marland Kitchen  PROSTATE VOLUME STUDY:  Using transrectal ultrasound the volume of the prostate was verified to be 39.8 cc.  SPECIAL TREATMENT PROCEDURE/SUPERVISION AND HANDLING: The pre-loaded needles were then delivered under sagittal guidance. A total of 19 needles were used to deposit 65 seeds in the prostate gland. The individual seed activity was 0.490  mCi.  SpaceOAR:  Yes  COMPLEX SIMULATION: At the end of the procedure, an anterior radiograph of the pelvis was obtained to document seed positioning and count. Cystoscopy was performed to check the urethra and bladder.  MICRODOSIMETRY: At the end of the procedure, the patient was emitting 0.091 mR/hr at 1 meter. Accordingly, he was considered safe for hospital discharge.  PLAN: The patient will return to the radiation oncology clinic for post implant CT dosimetry in three weeks.   ________________________________  Sheral Apley Tammi Klippel, M.D.

## 2020-11-04 NOTE — Anesthesia Postprocedure Evaluation (Signed)
Anesthesia Post Note  Patient: MACKEY VARRICCHIO  Procedure(s) Performed: RADIOACTIVE SEED IMPLANT/BRACHYTHERAPY IMPLANT (N/A Prostate) SPACE OAR INSTILLATION (N/A Rectum) CYSTOSCOPY (N/A Bladder)     Patient location during evaluation: PACU Anesthesia Type: General Level of consciousness: awake and alert Pain management: pain level controlled Vital Signs Assessment: post-procedure vital signs reviewed and stable Respiratory status: spontaneous breathing, nonlabored ventilation, respiratory function stable and patient connected to nasal cannula oxygen Cardiovascular status: blood pressure returned to baseline and stable Postop Assessment: no apparent nausea or vomiting Anesthetic complications: no   No complications documented.  Last Vitals:  Vitals:   10/25/20 1400 10/25/20 1455  BP: 128/84 138/75  Pulse: (!) 54 (!) 54  Resp: (!) 9 12  Temp:  36.6 C  SpO2: 98% 95%    Last Pain:  Vitals:   10/25/20 1455  TempSrc: Oral  PainSc: 0-No pain                 Kairi Tufo S

## 2020-11-06 ENCOUNTER — Other Ambulatory Visit: Payer: Self-pay

## 2020-11-06 MED ORDER — AMIODARONE HCL 200 MG PO TABS
100.0000 mg | ORAL_TABLET | Freq: Every day | ORAL | 2 refills | Status: DC
Start: 2020-11-06 — End: 2021-07-11

## 2020-11-15 IMAGING — DX DG CHEST 2V
2 series · 2 of 2 positions shown · non-contrast
Comparison: February 18, 2016

CLINICAL DATA: Preop

EXAM:
CHEST - 2 VIEW

[chest pa]
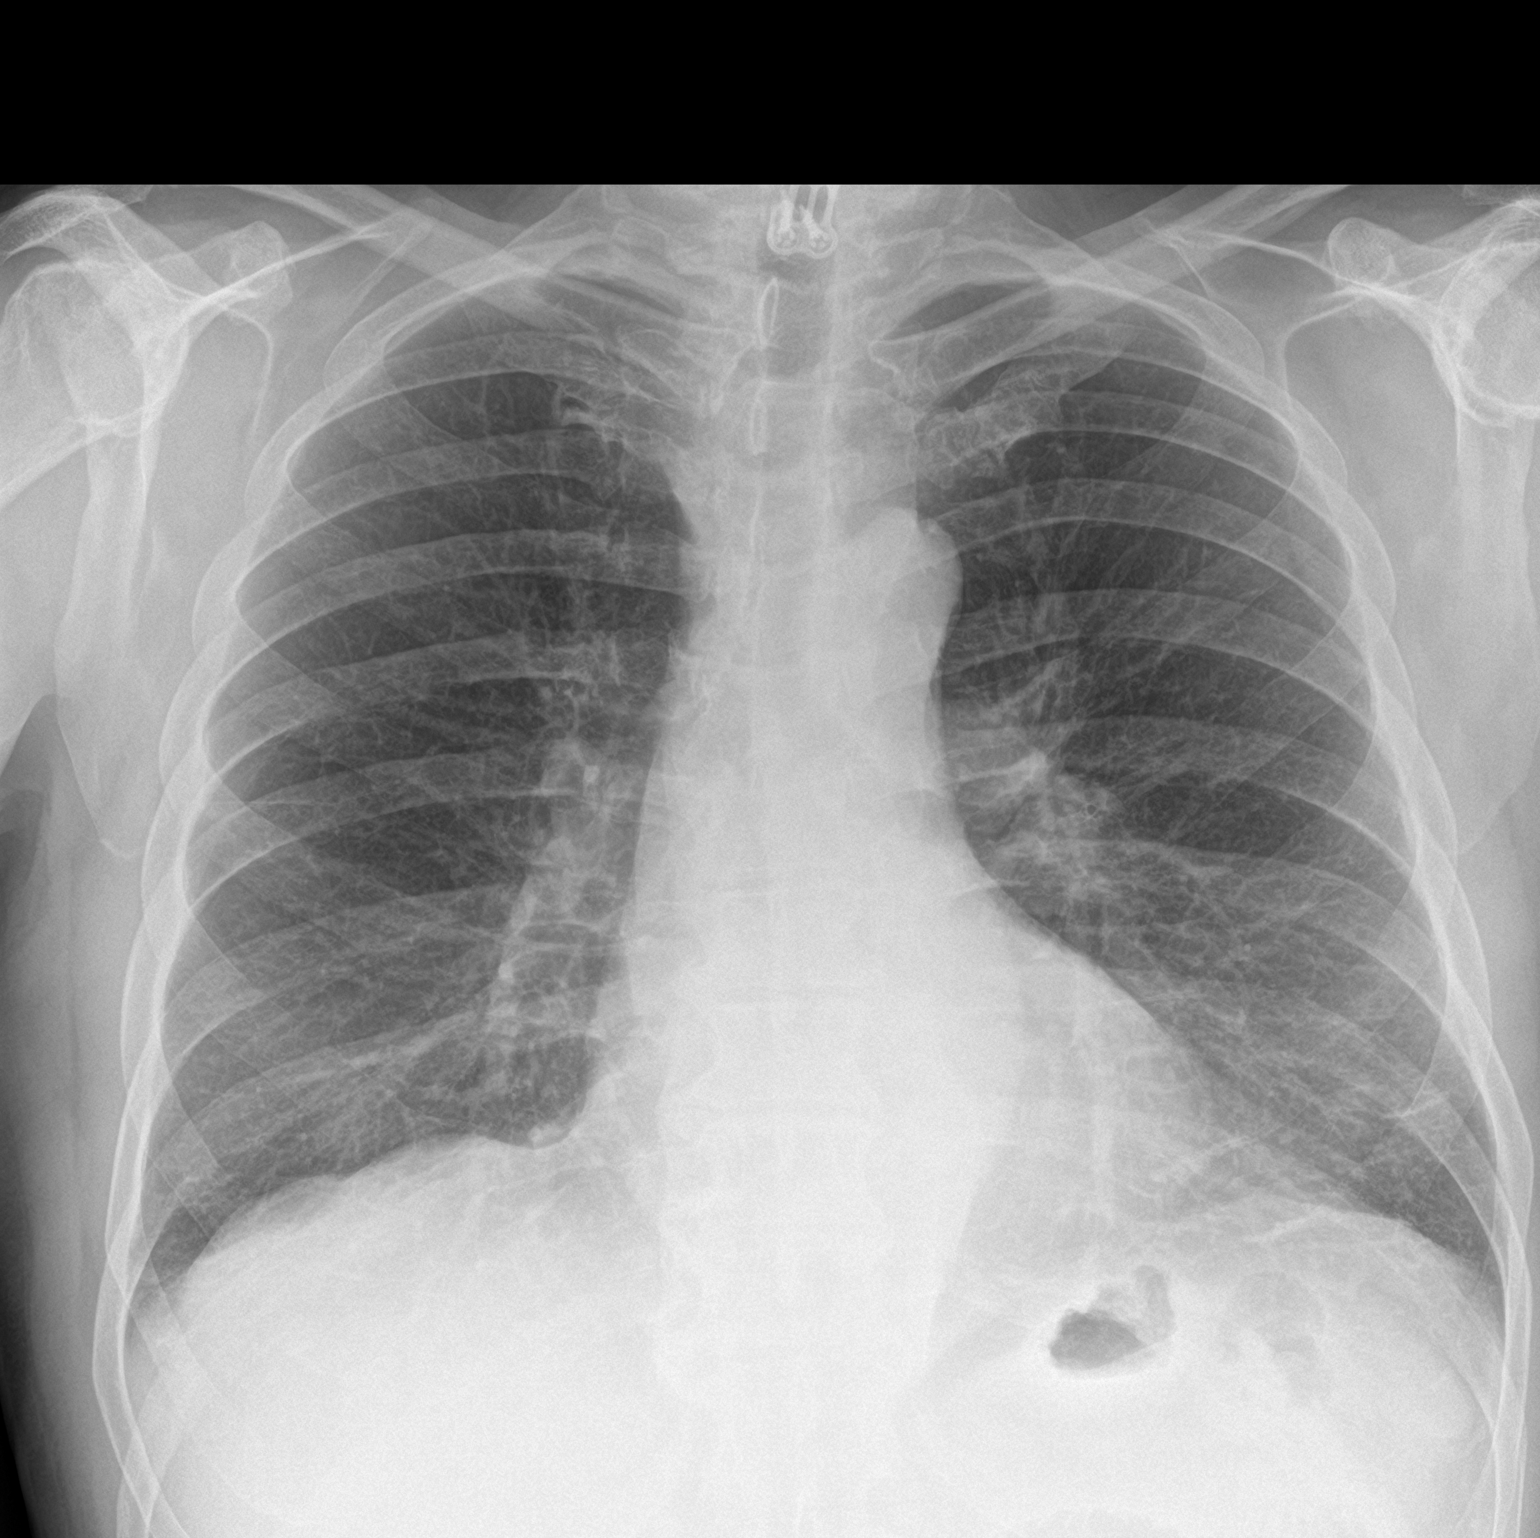

[chest lat]
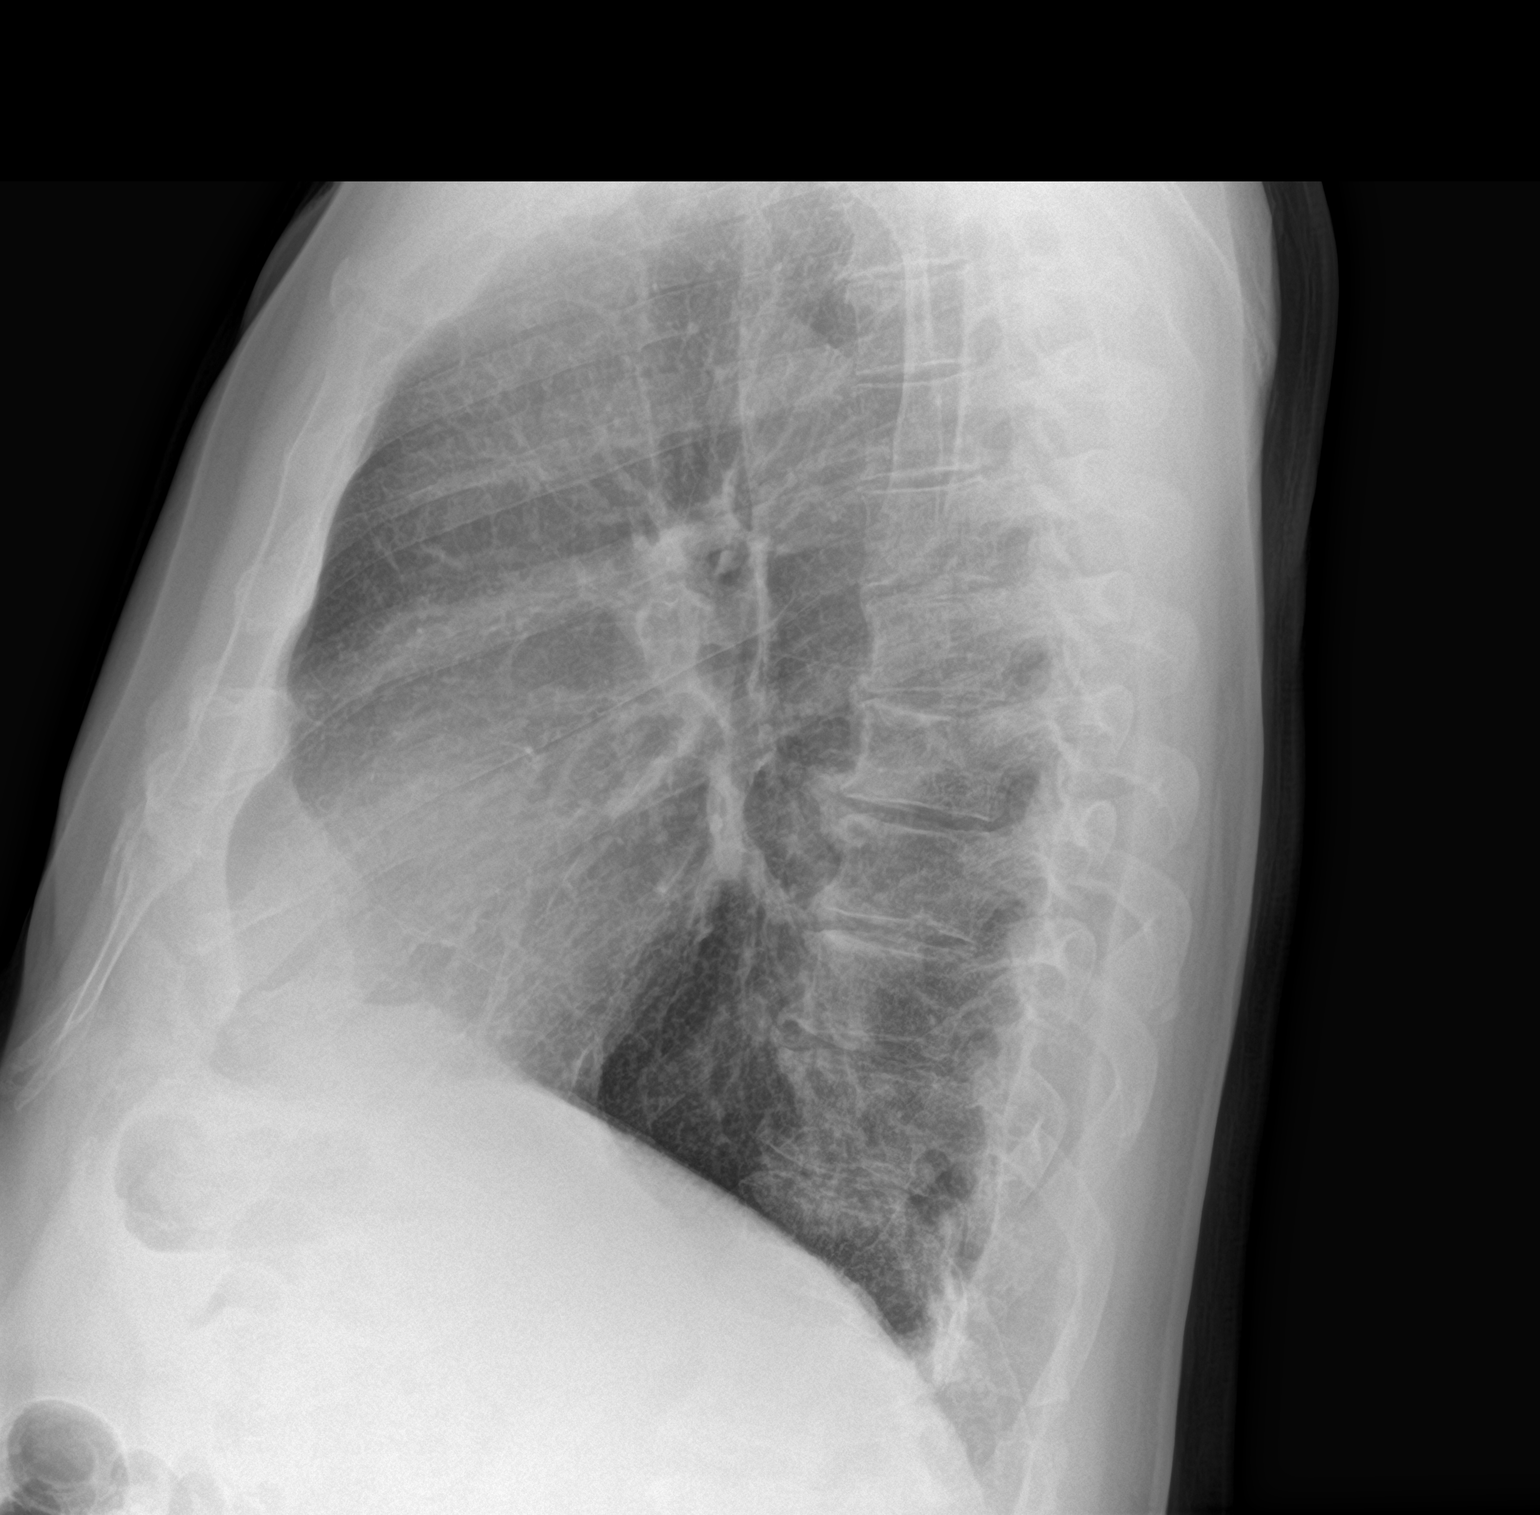

[2 of 2 positions shown; findings below may reference images not displayed]

FINDINGS: The cardiomediastinal silhouette is unchanged in contour.Tortuous
thoracic aorta. Scattered LEFT basilar atelectasis versus scar,
unchanged. No pleural effusion. No pneumothorax. No acute
pleuroparenchymal abnormality. Visualized abdomen is unremarkable.
Multilevel degenerative changes of the thoracic spine.
IMPRESSION: No acute cardiopulmonary abnormality.

## 2020-11-19 DIAGNOSIS — R3912 Poor urinary stream: Secondary | ICD-10-CM | POA: Diagnosis not present

## 2020-11-19 DIAGNOSIS — R3915 Urgency of urination: Secondary | ICD-10-CM | POA: Diagnosis not present

## 2020-11-26 DIAGNOSIS — H43812 Vitreous degeneration, left eye: Secondary | ICD-10-CM | POA: Diagnosis not present

## 2020-11-28 ENCOUNTER — Encounter: Payer: Self-pay | Admitting: Cardiovascular Disease

## 2020-11-28 ENCOUNTER — Other Ambulatory Visit: Payer: Self-pay

## 2020-11-28 ENCOUNTER — Ambulatory Visit (INDEPENDENT_AMBULATORY_CARE_PROVIDER_SITE_OTHER): Payer: BC Managed Care – PPO | Admitting: Cardiovascular Disease

## 2020-11-28 VITALS — BP 140/80 | HR 48 | Ht 72.0 in | Wt 218.0 lb

## 2020-11-28 DIAGNOSIS — I10 Essential (primary) hypertension: Secondary | ICD-10-CM

## 2020-11-28 DIAGNOSIS — I2510 Atherosclerotic heart disease of native coronary artery without angina pectoris: Secondary | ICD-10-CM | POA: Diagnosis not present

## 2020-11-28 DIAGNOSIS — I480 Paroxysmal atrial fibrillation: Secondary | ICD-10-CM | POA: Diagnosis not present

## 2020-11-28 DIAGNOSIS — E785 Hyperlipidemia, unspecified: Secondary | ICD-10-CM

## 2020-11-28 DIAGNOSIS — G4731 Primary central sleep apnea: Secondary | ICD-10-CM

## 2020-11-28 MED ORDER — ROSUVASTATIN CALCIUM 20 MG PO TABS: 20.0000 mg | ORAL_TABLET | Freq: Every day | ORAL | 3 refills | Status: DC

## 2020-11-28 MED ORDER — RIVAROXABAN 20 MG PO TABS
ORAL_TABLET | ORAL | 3 refills | Status: DC
Start: 2020-11-28 — End: 2021-01-02

## 2020-11-28 NOTE — Patient Instructions (Signed)
Medication Instructions:  BEGIN rosuvastatin (Crestor) 20 mg by mouth once a day *If you need a refill on your cardiac medications before your next appointment, please call your pharmacy*   Lab Work: CMET and Lipids - Fasting in our office in 3 months If you have labs (blood work) drawn today and your tests are completely normal, you will receive your results only by: Marland Kitchen MyChart Message (if you have MyChart) OR . A paper copy in the mail If you have any lab test that is abnormal or we need to change your treatment, we will call you to review the results.   Testing/Procedures: None ordered   Follow-Up: At [facility], you and your health needs are our priority.  As part of our continuing mission to provide you with exceptional heart care, we have created designated Provider Care Teams.  These Care Teams include your primary Cardiologist (physician) and Advanced Practice Providers (APPs -  Physician Assistants and Nurse Practitioners) who all work together to provide you with the care you need, when you need it.  We recommend signing up for the patient portal called "MyChart".  Sign up information is provided on this After Visit Summary.  MyChart is used to connect with patients for Virtual Visits (Telemedicine).  Patients are able to view lab/test results, encounter notes, upcoming appointments, etc.  Non-urgent messages can be sent to your provider as well.   To learn more about what you can do with MyChart, go to [email or web address].    Your next appointment:   4 month(s)  The format for your next appointment:   In Person  Provider:   Shelva Majestic, MD   Other Instructions Visit [email or web address] for a reduced-cost replacement mask for your sleep device.

## 2020-11-28 NOTE — Progress Notes (Signed)
Patient ID: Nathan Mcclure, male   DOB: 1951/11/01, 69 y.o.   MRN: [id number]    Primary M.D.: Dr. Deland Pretty  HPI: Nathan Mcclure is a 69 y.o. male who presents to the office for a 12 month followup cardiology/sleep evaluation.  [name] has a history of mild CAD initially found in 2000. His last cardiac catheterization in May 2005 revealed  mild nonobstructive CAD involving the LAD, intermediate, and RCA treated medically. He has a history of hyperlipidemia. He's been diagnosed with rheumatoid arthritis. He had done well with beta blocker therapy for many years but last year stopped taking this when his prescription had run out and he was seen as an add-on on 01/24/2014.  He was found to be in atrial fibrillation with ventricular rate at 110 beats per minute. He was restarted on low-dose metoprolol tartrate at 25 mg twice a day. Laboratory showed a TSH that was normal at 1.12. He had normal CMP profile. Creatinine was 0.85. He was started on Xarelto 20 mg with  normal hemoglobin and hematocrit. I further increased his metoprolol to 50 mg twice a day. An echo Doppler study  on 02/23/2014  showed an ejection fraction of 50-55%. There was mild to moderate mitral regurgitation. His left atrium measured 42 mm.  He was initially started on Propofenone150 mg every 8 hours and when attempted to be titrated to 225 mg 3 times a day he was unable to tolerate the increased dose of Propofenone due to nausea and reduced his dose back to 150 mg.   Due to concerns for significant obstructive sleep apnea with excessive daytime sleepiness he  underwent a sleep study which confirmed moderate sleep apnea with an AHI of 15.1.  He dropped his oxygen 87%.  There was heavy snoring.  He had a significant delay in ultimately undergoing a CPAP titration due to his canceling of the study, but ultimately a CPAP trial was done on 06/29/2014.  He was found to develop central apneic events in ultimately was switched to BiPAP.   He was titrated up to an IPAP of 16 and EPAP of 12, but continued to have centrals during sleep-wake junctions.  He has an AirCurve 10 Auto ResMed BiPAP unit and his current settings are for minimum EPAP of 10 with a maximum IPAP of 20.  A download was obtained from October 16 through 11/04/2014.  He met compliance with days of use being 80% but days greater than 4 hours was only 27%.  He was averaging 3 hours and 43 minutes of use.  Upon further questioning, he states as the pressure would build up  he would develop significant leakage  around the seal in the mask and mask would essentially blow off his face.  For this reason, he has not been able to be compliant.  He brings his unit with him and mask to the office today for further evaluation and assessment. A review of his download indicates a significant increase leak at 97.6.  His AHI was 13.4.  He underwent an initial cardioversion  on 11/20/2014.  At that time, he converted with just 1 shock at 120 J to normal sinus rhythm.   When I saw him in 2016 he had not been compliant with his medications and was only intermittently using BiPAP 2 times per week.  Instead of  taking his metoprolol tartrate 75 mg twice a day he was only taking this 50 mg daily.  He had continued Xarelto 20 mg for  anticoagulation.  He began to notice that he was feeling more chest fluttering.  He denied chest pain.  He denies PND or orthopnea.  He has been using [employer] for his DME company.  When I saw him in November 2016 he was in atrial fibrillation of questionable duration. His resting pulse was 101 and I increased his metoprolol to 75 mg twice a day. He has felt improved. He also has resumed using BiPAP therapy and admits that he feels so much better now that he has been using this on a daily basis.  When seen in December.  He was started on amiodarone 200 mg a day for 1 week and was then titrated to 2 mg twice a day.  He continued to be in atrial fibrillation.  On  02/19/2016 he underwent successful DC cardioversion and required 3 shocks at 120, 150, and ultimately cardioverted at 200 J.  His postprocedure ECG showed sinus bradycardia.  Subsequently, he has been on a reduced dose of metoprolol at 50 mg twice a day but has continued to take the amiodarone 200 mg twice a day.  He omits to 100% compliance with CPAP use.  He continues to be on Xarelto for anticoagulation.  He admits to resting pulse is in the 40s to at times. When I saw for post cardioversion follow-up  I reduced his metoprolol from 50 mg twice a day to 25 mg twice a day.  I again discussed importance of continued treatment for his obstructive sleep apnea.    He developed a kidney stone and underwent successful lithotripsy and held Xarelto for the procedure.  He is still working at [employer].  He has moved into an apartment complex.  Over the past month, he admits to a 10 pound weight gain.  He is walking over 20,000 steps per day.  He denies chest pain or breakthrough atrial fibrillation.  He admits to 100% compliance with BiPAP therapy.  He was recently started on Humira for his rheumatoid arthritis and is followed by Dr. Trudie Reed at [facility]ology.   When I  saw him in September 2018 he had stopped taking Xarelto 2 months previously due to concerns of getting into the "doughnut hole."  He was not aware of any recurrent episodes of atrial fibrillation.  He continued to take amiodarone 200 mg daily, losartan 50 mg daily, and doxasocin4 mg.  He icontinued to be on Crestor 20 mg for hyperlipidemia.  In May 2018.  Total cholesterol was 166, triglycerides 79, LDL 103.  LFTs were normal.  He was using  BiPAP with 100% compliance.  Based on his CHA2DS2-VASc score I recommended resumption of anticoagulation which he agreed and provided him with samples to initiate therapy.  He was evaluated in December 2019 by Almyra Deforest, PA-C for preoperative evaluation.  He has had issues with kidney stones.    Follow-up in  December 2020.  Over the prior year he had a right shoulder rotator cuff tear required surgery.  He is now working for [employer] to soon retire again.  He is walking all day at work.  He denies any episodes of chest pain or awareness of recurrent atrial fibrillation.  He states that he had only been intermittently using BiPAP but a download was attempted to be obtained today which did not reveal any recent use.  [employer] is his DME company.  He has not had recent laboratory.    Since I last saw him, he has been diagnosed with  prostate CA.  He has undergone brachii therapy and has had no active seed implants.  He is followed by Dr. Lucious Groves.  He lives by himself.  He is now working at [employer].  His rhythm has been stable but he believes he may have experienced 1 brief short-lived episode of heart rate irregularity lasted 20 minutes. He denies chest pain, PND, or orthopnea.  Past Surgical History:  Procedure Laterality Date  . BACK SURGERY  2010   lower  . CARDIAC CATHETERIZATION  05/14/2004   Contiued medical therapy  . CARDIOVASCULAR STRESS TEST  09/19/2009   Observed defect is consistent with diaphragmatic attentuation. EKG negative for ischemia.  Marland Kitchen CARDIOVERSION N/A 11/20/2014   Procedure: CARDIOVERSION;  Surgeon: Troy Sine, MD;  Location[facility] ENDOSCOPY;  Service: Cardiovascular;  Laterality: N/A;  . CARDIOVERSION N/A 02/19/2016   Procedure: CARDIOVERSION;  Surgeon: Troy Sine, MD;  Location: [facility];  Service: Cardiovascular;  Laterality: N/A;  . CERVICAL FUSION  2005  . CYSTOSCOPY N/A 10/25/2020   Procedure: CYSTOSCOPY;  Surgeon: Irine Seal, MD;  Location: [facility];  Service: Urology;  Laterality: N/A;  . EXTRACORPOREAL SHOCK WAVE LITHOTRIPSY Right 05/09/2019   Procedure: EXTRACORPOREAL SHOCK WAVE LITHOTRIPSY (ESWL);  Surgeon: Festus Aloe, MD;  Location: [facility] ORS;  Service: Urology;  Laterality: Right;  . [place], 2007   stent  insertion, stone basket   . PROSTATE BIOPSY  2021  . RADIOACTIVE SEED IMPLANT N/A 10/25/2020   Procedure: RADIOACTIVE SEED IMPLANT/BRACHYTHERAPY IMPLANT;  Surgeon: Irine Seal, MD;  Location: [facility];  Service: Urology;  Laterality: N/A;  . RESECTION DISTAL CLAVICAL Right 07/06/2019   Procedure: OPEN DISTAL CLAVICLE EXCISION;  Surgeon: Earlie Server, MD;  Location: [facility];  Service: Orthopedics;  Laterality: Right;  . SHOULDER ARTHROSCOPY WITH ROTATOR CUFF REPAIR AND SUBACROMIAL DECOMPRESSION Right 07/06/2019   Procedure: SHOULDER ARTHROSCOPY WITH OPEN ROTATOR CUFF REPAIR, SUBACROMIAL DECOMPRESSION WITH EXTENSIVE DEBRIDEMENT;  Surgeon: Earlie Server, MD;  Location: [facility];  Service: Orthopedics;  Laterality: Right;  . SPACE OAR INSTILLATION N/A 10/25/2020   Procedure: SPACE OAR INSTILLATION;  Surgeon: Irine Seal, MD;  Location: [facility];  Service: Urology;  Laterality: N/A;  . TONSILLECTOMY     as a child  . TRANSTHORACIC ECHOCARDIOGRAM  03/29/2008   EF 67%, mild mitral and tricuspid valve regurg.    Allergies  Allergen Reactions  . Penicillins     Childhood allergy    Current Outpatient Medications  Medication Sig Dispense Refill  . amiodarone (PACERONE) 200 MG tablet Take 0.5 tablets (100 mg total) by mouth daily. 45 tablet 2  . celecoxib (CELEBREX) 200 MG capsule Take 200 mg by mouth daily.    Marland Kitchen diltiazem (CARDIZEM) 30 MG tablet Take 1 tablet (30 mg total) by mouth 4 (four) times daily as needed (PALPITATIONS). 30 tablet 1  . fexofenadine (ALLEGRA) 180 MG tablet Take 180 mg by mouth at bedtime.     . fluticasone (FLONASE) 50 MCG/ACT nasal spray Place into both nostrils as needed.     . Glucos-Chondroit-Hyaluron-MSM (GLUCOSAMINE CHONDROITIN JOINT PO) Take 2 tablets by mouth 2 (two) times daily.    . lansoprazole (PREVACID) 15 MG capsule Take 15 mg by mouth at bedtime.    Marland Kitchen losartan (COZAAR) 50 MG tablet TAKE 1  TABLET BY MOUTH TWICE A DAY (Patient taking differently: daily. ) 180 tablet 2  . Multiple Vitamin (MULTIVITAMIN) tablet Take 1 tablet  by mouth daily.    Marland Kitchen oxyCODONE (OXY IR/ROXICODONE) 5 MG immediate release tablet Take one tab po q4-6hrs prn pain, may need 1-2 first couple weeks 40 tablet 0  . rivaroxaban (XARELTO) 20 MG TABS tablet TAKE 1 TABLET (20 MG TOTAL) BY MOUTH DAILY WITH SUPPER. 90 tablet 3  . sertraline (ZOLOFT) 100 MG tablet Take 100 mg by mouth daily.    . tadalafil (CIALIS) 5 MG tablet Take 5 mg by mouth at bedtime. For bph    . rosuvastatin (CRESTOR) 20 MG tablet Take 1 tablet (20 mg total) by mouth daily. 90 tablet 3   No current facility-administered medications for this visit.    Social History   Socioeconomic History  . Marital status: Widowed    Spouse name: Not on file  . Number of children: Not on file  . Years of education: Not on file  . Highest education level: Not on file  Occupational History  . Occupation: retired    Comment: [employer]  Tobacco Use  . Smoking status: Former Smoker    Packs/day: 0.25    Years: 40.00    Pack years: 10.00    Types: Cigarettes    Quit date: 01/25/2004    Years since quitting: 16.8  . Smokeless tobacco: Never Used  Vaping Use  . Vaping Use: Never used  Substance and Sexual Activity  . Alcohol use: Not Currently    Alcohol/week: 4.0 standard drinks    Types: 4 Standard drinks or equivalent per week    Comment: couple times a month  . Drug use: No  . Sexual activity: Not Currently  Other Topics Concern  . Not on file  Social History Narrative   One of nine siblings (6 girls and 3 boys). Lost wife to cancer of the sinus approximately ten years ago (2011).    Social Determinants of Health   Financial Resource Strain: Not on file  Food Insecurity: Not on file  Transportation Needs: Not on file  Physical Activity: Not on file  Stress: Not on file  Social Connections: Not on file  Intimate Partner Violence: Not on file    Socially he is widowed for 4 years.  There is no tobacco use.  He had broken up with his fiance.  Family History  Problem Relation Age of Onset  . Diabetes Mother   . Kidney failure Mother   . Heart disease Mother   . Prostate cancer Father   . Alzheimer's disease Father   . Brain cancer Sister   . Lung cancer Brother   . Heart disease Sister   . Tremor Daughter   . Alopecia Daughter     ROS General: Negative; No fevers, chills, or night sweats;  HEENT: Negative; No changes in vision or hearing, sinus congestion, difficulty swallowing Pulmonary: Negative; No cough, wheezing, shortness of breath, hemoptysis Cardiovascular:  unaware of his atrial fibrillation; No chest pain, presyncope, syncope,  GI: Negative; No nausea, vomiting, diarrhea, or abdominal pain GU: Prostate CA, radioactive seed implantation Musculoskeletal: Positive for cervical and lumbar disc disease; right shoulder rotator cuff tear  Hematologic/Oncology: Negative; no easy bruising, bleeding Endocrine: Negative; no heat/cold intolerance; no diabetes Neuro: Negative; no changes in balance, headaches Skin: Negative; No rashes or skin lesions Psychiatric: Negative; No behavioral problems, depression Sleep: Positive for moderate complex obstructive sleep apnea on BiPAP with set up date August 16, 2014 [employer] was his DME company which has been brought out by [employer].  Not use therapy for  some time.  Other comprehensive 14 point system review is negative.  PE BP 140/80   Pulse (!) 48   Ht 6' (1.829 m)   Wt 218 lb (98.9 kg)   SpO2 98%   BMI 29.57 kg/m    Repeat blood pressure by me was 166/90.  Wt Readings from Last 3 Encounters:  11/28/20 218 lb (98.9 kg)  10/25/20 218 lb 14.4 oz (99.3 kg)  10/22/20 220 lb (99.8 kg)   General: Alert, oriented, no distress.  Skin: normal turgor, no rashes, warm and dry HEENT: Normocephalic, atraumatic. Pupils equal round and reactive to light; sclera anicteric;  extraocular muscles intact; Nose without nasal septal hypertrophy Mouth/Parynx benign; Mallinpatti scale 3 Neck: No JVD, no carotid bruits; normal carotid upstroke Lungs: clear to ausculatation and percussion; no wheezing or rales Chest wall: without tenderness to palpitation Heart: PMI not displaced, RRR, s1 s2 normal, 2-4/7 systolic murmur, no diastolic murmur, no rubs, gallops, thrills, or heaves Abdomen: soft, nontender; no hepatosplenomehaly, BS+; abdominal aorta nontender and not dilated by palpation. Back: no CVA tenderness Pulses 2+ Musculoskeletal: full range of motion, normal strength, no joint deformities Extremities: no clubbing cyanosis or edema, Homan's sign negative  Neurologic: grossly nonfocal; Cranial nerves grossly wnl Psychologic: Normal mood and affect  ECG (independently read by me): Simnus bradycardia at 48; no ectopy, normal intervals  December 01, 2019 ECG (independently read by me): Sinus bradycardia 56 bpm.  Nonspecific ST changes.  QTc interval 430 ms  September 2018 ECG (independently read by me): sinus bradycardia at 46 bpm with sinus arrhythmia.  Normal intervals.  December 2017 ECG (independently read by me): Sinus bradycardia 52 bpm.  QTc interval 396 ms.  PR interval 168 ms.  No ectopy.  September 2017 ECG (independently read by me): Marked sinus bradycardia with a heart rate at 37 bpm.  PR interval 180 ms.  QTc interval 414 ms.  May 2017 ECG (independently read by me): Marked sinus bradycardia with a ventricular rate of 37 bpm with sinus arrhythmia  March 2017 ECG (independently read by me): Marked sinus bradycardia 41 bpm.  Nondiagnostic T changes.  QTc interval 528 ms.  12/14/2015 ECG (independently read by me):  Atrial fibrillation at 90 bpm.  Nonspecific T changes.  QTc interval 442 ms.  November 12 2015 ECG (independently read by me): Atrial fibrillation with a rapid ventricular response at 10 1 bpm.  Nonspecific ST-T changes.  11/09/2014 ECG  (independently read by me): Atrial fibrillation with controlled ventricular response in the 70s.  QTc interval 407 ms.  June 2015 ECG (independently read by me): Atrial fibrillation with a ventricular rate at 100 beats per minute.  QTc interval 428 ms.  Nonspecific T changes  05/08/2014 ECG (independently read by me): atrial fibrillation at 94 beats per minute.  QTc interval 425 ms.  02/23/2014 ECG (independently read by me) atrial fibrillation at 98 beats per minute. Nonspecific ST-T changes   Prior 02/08/2014 ECG (independently read by me): Atrial fibrillation with a rapid ventricular response at 128 beats per minute with nonspecific ST-T changes.   LABS: Laboratory from [facility] from 05/21/2017 has been reviewed.  H/H 13.2/41.0.  C-reactive protein 1.8.  BUN 21, creatinine 0.96.  LFTs normal.  BMP Latest Ref Rng & Units 10/22/2020 02/13/2016 12/10/2015  Glucose 70 - 99 mg/dL 104(H) 73 91  BUN 8 - 23 mg/dL _0 Creatinine 0.61 - 1.24 mg/dL 0.93 0.99 0.85  Sodium 135 - 145 mmol/L 144 144 139  Potassium 3.5 - 5.1 mmol/L 4.0 4.2 4.4  Chloride 98 - 111 mmol/L 109 106 105  CO2 22 - 32 mmol/L _0 Calcium 8.9 - 10.3 mg/dL 8.8(L) 8.7 8.8   Hepatic Function Latest Ref Rng & Units 10/22/2020 12/10/2015 11/15/2014  Total Protein 6.5 - 8.1 g/dL 7.1 6.7 6.9  Albumin 3.5 - 5.0 g/dL 3.9 4.1 4.2  AST 15 - 41 U/L _1 ALT 0 - 44 U/L _2 Alk Phosphatase 38 - 126 U/L 77 53 60  Total Bilirubin 0.3 - 1.2 mg/dL 0.9 0.7 0.8   CBC Latest Ref Rng & Units 10/22/2020 02/13/2016 12/10/2015  WBC 4.0 - 10.5 K/uL 7.5 6.4 5.4  Hemoglobin 13.0 - 17.0 g/dL 13.4 14.8 13.5  Hematocrit 39.0 - 52.0 % 41.1 44.6 39.4  Platelets 150 - 400 K/uL 230 202 177   Lab Results  Component Value Date   MCV 94.3 10/22/2020   MCV 94.7 02/13/2016   MCV 94.0 12/10/2015   Lab Results  Component Value Date   TSH 1.899 10/22/2020  No results found for: HGBA1C   Lipid Panel     Component Value  Date/Time   CHOL 258 (H) 10/22/2020 1307   TRIG 84 10/22/2020 1307   HDL 45 10/22/2020 1307   CHOLHDL 5.7 10/22/2020 1307   VLDL 17 10/22/2020 1307   LDLCALC 196 (H) 10/22/2020 1307    RADIOLOGY: No results found.  IMPRESSION:  1. Coronary artery disease involving native coronary artery of native heart without angina pectoris   2. Hyperlipidemia LDL goal <70   3. Paroxysmal atrial fibrillation (HCC)   4. Essential hypertension   5. Complex sleep apnea syndrome      ASSESSMENT AND PLAN: [name] is a 69 year-old gentleman who has mild nonobstructive coronary disease. He developed atrial fibrillation after stopping his metoprolol in 2015.  An echo Doppler study confirmed an ejection fraction of 50-55% with mild-to-moderate mitral regurgitation and mild biatrial enlargement. Remotely he had been on propofenone and metoprolol.since his cardioversion in February 2017.  He has been maintaining sinus rhythm and has been without awareness of recurrent arrhythmia.  He has complex sleep apnea and had initiated BiPAP therapy on August 16, 2014.  He has not been using therapy.  Presently, he continues to be on amiodarone 100 mg daily, and over the past year admits to one brief episode of A. fib.  He is on losartan 50 mg twice a day and has a prescription for diltiazem to take as needed for palpitations.  He continues to be on Xarelto for anticoagulation.  He developed prostate cancer Gleason score 3.3 and underwent radioactive seed implantation by Dr. Irine Seal.  I again had a discussion with him concerning potential increased recurrent risk of atrial fibrillation without treatment of his sleep apnea.  I have recommended he resume therapy and have recommended changing his mask to a ResMed AirFit N 30i mask which I believe he will tolerate much better.  Laboratory was reviewed.  On October 22, 2020, lipid studies were significantly elevated when checked by Dr. Loney Loh with total cholesterol 258, LDL  cholesterol 196.  HDL cholesterol was 45.  I have recommended initiation of rosuvastatin 20 mg.  In 3 months he will have a comprehensive metabolic panel and fasting lipid studies and I will see him in the office for follow-up evaluation.   Troy Sine, MD, [facility]  11/30/2020 6:18 PM

## 2020-11-30 ENCOUNTER — Encounter: Payer: Self-pay | Admitting: Cardiovascular Disease

## 2020-12-04 ENCOUNTER — Telehealth: Payer: Self-pay | Admitting: *Deleted

## 2020-12-04 NOTE — Telephone Encounter (Signed)
Called patient to remind of post seed appts. for 12-05-20, lvm for a return call

## 2020-12-05 ENCOUNTER — Ambulatory Visit: Payer: BC Managed Care – PPO | Admitting: Radiation Oncology

## 2020-12-05 ENCOUNTER — Ambulatory Visit: Payer: BC Managed Care – PPO | Admitting: Urology

## 2020-12-11 DIAGNOSIS — G894 Chronic pain syndrome: Secondary | ICD-10-CM | POA: Diagnosis not present

## 2020-12-11 DIAGNOSIS — M4712 Other spondylosis with myelopathy, cervical region: Secondary | ICD-10-CM | POA: Diagnosis not present

## 2020-12-26 ENCOUNTER — Telehealth: Payer: Self-pay | Admitting: *Deleted

## 2020-12-26 NOTE — Progress Notes (Signed)
  Radiation Oncology         [phone] ________________________________  Name: Nathan Mcclure MRN: [id number]  Date: 12/27/2020  DOB: 1951-07-24  COMPLEX SIMULATION NOTE  NARRATIVE:  The patient was brought to the CT Simulation planning suite today following prostate seed implantation approximately one month ago.  Identity was confirmed.  All relevant records and images related to the planned course of therapy were reviewed.  Then, the patient was set-up supine.  CT images were obtained.  The CT images were loaded into the planning software.  Then the prostate and rectum were contoured.  Treatment planning then occurred.  The implanted iodine 125 seeds were identified by the physics staff for projection of radiation distribution  I have requested : 3D Simulation  I have requested a DVH of the following structures: Prostate and rectum.    ________________________________  Artist Pais Kathrynn Running, M.D.

## 2020-12-26 NOTE — Progress Notes (Signed)
Radiation Oncology         [phone] ________________________________  Name: NAMARI BRETON MRN: [id number]  Date: 12/27/2020  DOB: [birth year]  Post-Seed Follow-Up Visit Note  CC: Nathan Brunette, MD  Bjorn Pippin, MD  Diagnosis:   70 y.o. gentleman with Stage T2a adenocarcinoma of the prostate with Gleason score of 3+4, and PSA of 7.82.    ICD-10-CM   1. Malignant neoplasm of prostate (HCC)  C61     Interval Since Last Radiation:  9 weeks 10/25/20:  Insertion of radioactive I-125 seeds into the prostate gland; 145 Gy, definitive therapy with placement of SpaceOAR VUE gel.  Narrative:  The patient returns today for routine follow-up.  He is complaining of increased urinary frequency and urinary hesitation symptoms. He filled out a questionnaire regarding urinary function today providing and overall IPSS score of 21 characterizing his symptoms as severe with persistent weak stream, intermittency, hesitancy, urgency, frequency, nocturia 1-2 times per night and occasional dysuria at the beginning of his stream.  He specifically denies gross hematuria, flank pain, abdominal pain, nausea, vomiting, diarrhea or constipation.  His pre-implant score was 13 with weak stream, urinary urgency and occasional leakage. He has continued taking Cialis daily to help manage his LUTS which he reports is minimally beneficial. He reports a healthy appetite and is maintaining his weight.  He denies any significant fatigue and is overall, pleased with his progress to date.    ALLERGIES:  is allergic to penicillins.  Meds: Current Outpatient Medications  Medication Sig Dispense Refill  . amiodarone (PACERONE) 200 MG tablet Take 0.5 tablets (100 mg total) by mouth daily. 45 tablet 2  . celecoxib (CELEBREX) 200 MG capsule Take 200 mg by mouth daily.    Marland Kitchen diltiazem (CARDIZEM) 30 MG tablet Take 1 tablet (30 mg total) by mouth 4 (four) times daily as needed (PALPITATIONS). 30 tablet 1  . fexofenadine (ALLEGRA) 180  MG tablet Take 180 mg by mouth at bedtime.     . fluticasone (FLONASE) 50 MCG/ACT nasal spray Place into both nostrils as needed.     . Glucos-Chondroit-Hyaluron-MSM (GLUCOSAMINE CHONDROITIN JOINT PO) Take 2 tablets by mouth 2 (two) times daily.    . lansoprazole (PREVACID) 15 MG capsule Take 15 mg by mouth at bedtime.    Marland Kitchen losartan (COZAAR) 50 MG tablet TAKE 1 TABLET BY MOUTH TWICE A DAY (Patient taking differently: daily. ) 180 tablet 2  . Multiple Vitamin (MULTIVITAMIN) tablet Take 1 tablet by mouth daily.    Marland Kitchen oxyCODONE (OXY IR/ROXICODONE) 5 MG immediate release tablet Take one tab po q4-6hrs prn pain, may need 1-2 first couple weeks 40 tablet 0  . rivaroxaban (XARELTO) 20 MG TABS tablet TAKE 1 TABLET (20 MG TOTAL) BY MOUTH DAILY WITH SUPPER. 90 tablet 3  . rosuvastatin (CRESTOR) 20 MG tablet Take 1 tablet (20 mg total) by mouth daily. 90 tablet 3  . sertraline (ZOLOFT) 100 MG tablet Take 100 mg by mouth daily.    . tadalafil (CIALIS) 5 MG tablet Take 5 mg by mouth at bedtime. For bph     No current facility-administered medications for this visit.    Physical Findings: In general this is a well appearing Caucasian male in no acute distress. He's alert and oriented x4 and appropriate throughout the examination. Cardiopulmonary assessment is negative for acute distress and he exhibits normal effort.   Lab Findings: Lab Results  Component Value Date   WBC 7.5 10/22/2020   HGB 13.4 10/22/2020  HCT 41.1 10/22/2020   MCV 94.3 10/22/2020   PLT 230 10/22/2020    Radiographic Findings:  Patient underwent CT imaging in our clinic for post implant dosimetry. The CT will be reviewed by Dr. Tammi Klippel to confirm there is an adequate distribution of radioactive seeds throughout the prostate gland and ensure that there are no seeds in or near the rectum. We suspect the final radiation plan and dosimetry will show appropriate coverage of the prostate gland. He understands that we will call and inform  him of any unexpected findings on further review of his imaging and dosimetry.  Impression/Plan: 70 y.o. gentleman with Stage T2a adenocarcinoma of the prostate with Gleason score of 3+4, and PSA of 7.82. The patient is recovering from the effects of radiation. His urinary symptoms should gradually improve over the next 4-6 months. We talked about this today. He is encouraged by his improvement already and is otherwise pleased with his outcome. We also talked about long-term follow-up for prostate cancer following seed implant. He understands that ongoing PSA determinations and digital rectal exams will help perform surveillance to rule out disease recurrence. He has a follow up appointment scheduled with Dr. Jeffie Pollock on February 04, 2021 at 10:15 AM. He understands what to expect with his PSA measures. Patient was also educated today about some of the long-term effects from radiation including a small risk for rectal bleeding and possibly erectile dysfunction. We talked about some of the general management approaches to these potential complications. However, I did encourage the patient to contact our office or return at any point if he has questions or concerns related to his previous radiation and prostate cancer.    Nicholos Johns, PA-C

## 2020-12-26 NOTE — Telephone Encounter (Signed)
CALLED PATIENT TO REMIND OF POST SEED APPTS. FOR 12-27-20, LVM FOR A RETURN CALL

## 2020-12-27 ENCOUNTER — Encounter: Payer: Self-pay | Admitting: Medical Oncology

## 2020-12-27 ENCOUNTER — Ambulatory Visit
Admission: RE | Admit: 2020-12-27 | Discharge: 2020-12-27 | Disposition: A | Discharge status: Home / Self Care | Payer: BC Managed Care – PPO | Source: Ambulatory Visit | Attending: Urology | Admitting: Urology

## 2020-12-27 ENCOUNTER — Ambulatory Visit
Admission: RE | Admit: 2020-12-27 | Discharge: 2020-12-27 | Disposition: A | Discharge status: Home / Self Care | Payer: BC Managed Care – PPO | Source: Ambulatory Visit | Attending: Radiation Oncology | Admitting: Radiation Oncology

## 2020-12-27 ENCOUNTER — Encounter: Payer: Self-pay | Admitting: Urology

## 2020-12-27 ENCOUNTER — Other Ambulatory Visit: Payer: Self-pay

## 2020-12-27 VITALS — BP 156/76 | HR 98 | Temp 97.3°F | Resp 18 | Wt 215.4 lb

## 2020-12-27 DIAGNOSIS — Z79899 Other long term (current) drug therapy: Secondary | ICD-10-CM | POA: Insufficient documentation

## 2020-12-27 DIAGNOSIS — Z7901 Long term (current) use of anticoagulants: Secondary | ICD-10-CM | POA: Insufficient documentation

## 2020-12-27 DIAGNOSIS — C61 Malignant neoplasm of prostate: Secondary | ICD-10-CM | POA: Diagnosis not present

## 2020-12-27 DIAGNOSIS — M4712 Other spondylosis with myelopathy, cervical region: Secondary | ICD-10-CM | POA: Diagnosis not present

## 2020-12-27 DIAGNOSIS — R350 Frequency of micturition: Secondary | ICD-10-CM | POA: Diagnosis not present

## 2020-12-27 DIAGNOSIS — G894 Chronic pain syndrome: Secondary | ICD-10-CM | POA: Diagnosis not present

## 2020-12-27 DIAGNOSIS — R3911 Hesitancy of micturition: Secondary | ICD-10-CM | POA: Diagnosis not present

## 2020-12-27 DIAGNOSIS — M545 Low back pain, unspecified: Secondary | ICD-10-CM | POA: Diagnosis not present

## 2020-12-27 NOTE — Progress Notes (Signed)
Patient is here today for a post seed follow-up appointment.Patient reports  Dysuria States that he has pain not burning Hematuria  None Nocturia x1-2 Emptying bladder  Stream Weak Leakage Some Urgency Some Bowels  None Next appointment with urologist Seen urologist two weeks ago IPPS was 21 Vitals:   12/27/20 0930  BP: (!) 156/76  Pulse: 98  Resp: 18  Temp: (!) 97.3 F (36.3 C)  SpO2: 98%  Weight: 97.7 kg

## 2021-01-01 DIAGNOSIS — S01112A Laceration without foreign body of left eyelid and periocular area, initial encounter: Secondary | ICD-10-CM | POA: Diagnosis not present

## 2021-01-02 ENCOUNTER — Other Ambulatory Visit: Payer: Self-pay

## 2021-01-02 ENCOUNTER — Encounter: Payer: Self-pay | Admitting: Radiation Oncology

## 2021-01-02 DIAGNOSIS — C61 Malignant neoplasm of prostate: Secondary | ICD-10-CM | POA: Diagnosis not present

## 2021-01-02 MED ORDER — RIVAROXABAN 20 MG PO TABS
ORAL_TABLET | ORAL | 1 refills | Status: DC
Start: 2021-01-02 — End: 2021-06-10

## 2021-01-02 MED ORDER — ROSUVASTATIN CALCIUM 20 MG PO TABS
20.0000 mg | ORAL_TABLET | Freq: Every day | ORAL | 3 refills | Status: DC
Start: 2021-01-02 — End: 2021-12-10

## 2021-01-04 NOTE — Progress Notes (Signed)
  Radiation Oncology         [phone] ________________________________  Name: Nathan Mcclure MRN: [id number]  Date: 01/02/2021  DOB: [birth year]  3D Planning Note   Prostate Brachytherapy Post-Implant Dosimetry  Diagnosis:  70 y.o. gentleman with Stage T2a adenocarcinoma of the prostate with Gleason score of 3+4, and PSA of 7.82.  Narrative: On a previous date, Nathan Mcclure returned following prostate seed implantation for post implant planning. He underwent CT scan complex simulation to delineate the three-dimensional structures of the pelvis and demonstrate the radiation distribution.  Since that time, the seed localization, and complex isodose planning with dose volume histograms have now been completed.  Results:   Prostate Coverage - The dose of radiation delivered to the 90% or more of the prostate gland (D90) was 94.78% of the prescription dose. This exceeds our goal of greater than 90%. Rectal Sparing - The volume of rectal tissue receiving the prescription dose or higher was 0.0 cc. This falls under our thresholds tolerance of 1.0 cc.  Impression: The prostate seed implant appears to show adequate target coverage and appropriate rectal sparing.  Plan:  The patient will continue to follow with urology for ongoing PSA determinations. I would anticipate a high likelihood for local tumor control with minimal risk for rectal morbidity.  ________________________________  Sheral Apley Tammi Klippel, M.D.

## 2021-01-14 DIAGNOSIS — M545 Low back pain, unspecified: Secondary | ICD-10-CM | POA: Diagnosis not present

## 2021-01-14 DIAGNOSIS — M5136 Other intervertebral disc degeneration, lumbar region: Secondary | ICD-10-CM | POA: Diagnosis not present

## 2021-01-14 DIAGNOSIS — Z981 Arthrodesis status: Secondary | ICD-10-CM | POA: Diagnosis not present

## 2021-01-14 DIAGNOSIS — M48061 Spinal stenosis, lumbar region without neurogenic claudication: Secondary | ICD-10-CM | POA: Diagnosis not present

## 2021-01-24 DIAGNOSIS — I10 Essential (primary) hypertension: Secondary | ICD-10-CM | POA: Diagnosis not present

## 2021-01-24 DIAGNOSIS — Z6831 Body mass index (BMI) 31.0-31.9, adult: Secondary | ICD-10-CM | POA: Diagnosis not present

## 2021-01-24 DIAGNOSIS — G894 Chronic pain syndrome: Secondary | ICD-10-CM | POA: Diagnosis not present

## 2021-01-24 DIAGNOSIS — M5106 Intervertebral disc disorders with myelopathy, lumbar region: Secondary | ICD-10-CM | POA: Diagnosis not present

## 2021-01-29 ENCOUNTER — Telehealth: Payer: Self-pay | Admitting: *Deleted

## 2021-01-29 NOTE — Telephone Encounter (Signed)
Called patient to inform him that I will discuss his BIPAP settings with Dr Claiborne Billings when he comes into the office on Thursday and make adjustments if needed per Dr Evette Georges orders and call him back to let him know what was done. Patient agrees with plan and will await to hear back from me.

## 2021-01-31 ENCOUNTER — Other Ambulatory Visit: Payer: Self-pay | Admitting: Orthopaedic Surgery

## 2021-01-31 DIAGNOSIS — M546 Pain in thoracic spine: Secondary | ICD-10-CM

## 2021-01-31 DIAGNOSIS — M542 Cervicalgia: Secondary | ICD-10-CM

## 2021-02-01 ENCOUNTER — Telehealth: Payer: Self-pay | Admitting: *Deleted

## 2021-02-01 NOTE — Telephone Encounter (Signed)
Called patient to inform him per Dr Claiborne Billings that he cannot drop the BIPAP pressure any lower. Patient states that he doesn't need them dropped. He needs them increased. He doesn't feel like he is getting enough air. I told him I will speak with Dr Claiborne Billings on Monday and call him back with recommendations.

## 2021-02-04 ENCOUNTER — Telehealth: Payer: Self-pay | Admitting: *Deleted

## 2021-02-04 NOTE — Telephone Encounter (Signed)
Left message Dr Claiborne Billings reviewed BIPAP pressure and ordered some  changes. He is to restart the use of the machine tonight.

## 2021-02-21 DIAGNOSIS — M25512 Pain in left shoulder: Secondary | ICD-10-CM | POA: Diagnosis not present

## 2021-02-26 ENCOUNTER — Telehealth: Payer: Self-pay

## 2021-02-26 NOTE — Telephone Encounter (Signed)
   [facility] Pre-operative Risk Assessment    HEARTCARE STAFF: - Please ensure there is not already an duplicate clearance open for this procedure. - Under Visit Info/Reason for Call, type in Other and utilize the format Clearance MM/DD/YY or Clearance TBD. Do not use dashes or single digits. - If request is for dental extraction, please clarify the # of teeth to be extracted.  Request for surgical clearance:  1. What type of surgery is being performed? Left Shoulder Arthroscopy   2. When is this surgery scheduled? TBD   3. What type of clearance is required (medical clearance vs. Pharmacy clearance to hold med vs. Both)? Both  4. Are there any medications that need to be held prior to surgery and how long? Xarelto   5. Practice name and name of physician performing surgery? [facility], Dr. Cleda Mccreedy   6. What is the office phone number? [phone]   7.   What is the office fax number? [phone] Attn: Sherri  8.   Anesthesia type (None, local, MAC, general) ? Not listed    Nathan Mcclure 02/26/2021, 3:45 PM  _________________________________________________________________   (provider comments below)

## 2021-02-27 NOTE — Telephone Encounter (Signed)
Patient with diagnosis of afib on Xarelto for anticoagulation.    Procedure: left shoulder arthroscopy Date of procedure: TBD  CHA2DS2-VASc Score = 3  This indicates a 3.2% annual risk of stroke. The patient's score is based upon: CHF History: No HTN History: Yes Diabetes History: No Stroke History: No Vascular Disease History: Yes Age Score: 1 Gender Score: 0   CrCl 26mL/min Platelet count 230K  Per office protocol, patient can hold Xarelto for 3 days prior to procedure.

## 2021-02-27 NOTE — Telephone Encounter (Signed)
   Primary Cardiologist: Shelva Majestic, MD  Chart reviewed as part of pre-operative protocol coverage. Patient was contacted 02/27/2021 in reference to pre-operative risk assessment for pending surgery as outlined below.  Nathan Mcclure was last seen on 11/27/20 by Dr. Claiborne Billings.  Since that day, Nathan Mcclure has done well.  He can complete more than 4.0 METS without angina.   Per our clinical pharmacist: Patient with diagnosis of afib on Xarelto for anticoagulation.    Procedure: left shoulder arthroscopy Date of procedure: TBD  CHA2DS2-VASc Score = 3  This indicates a 3.2% annual risk of stroke. The patient's score is based upon: CHF History: No HTN History: Yes Diabetes History: No Stroke History: No Vascular Disease History: Yes Age Score: 1 Gender Score: 0   CrCl 9mL/min Platelet count 230K  Per office protocol, patient can hold Xarelto for 3 days prior to procedure.  Therefore, based on ACC/AHA guidelines, the patient would be at acceptable risk for the planned procedure without further cardiovascular testing.   The patient was advised that if he develops new symptoms prior to surgery to contact our office to arrange for a follow-up visit, and he verbalized understanding.  I will route this recommendation to the requesting party via Epic fax function and remove from pre-op pool. Please call with questions.  Ledora Bottcher, PA 02/27/2021, 5:19 PM

## 2021-03-04 DIAGNOSIS — Z6829 Body mass index (BMI) 29.0-29.9, adult: Secondary | ICD-10-CM | POA: Diagnosis not present

## 2021-03-04 DIAGNOSIS — Z01818 Encounter for other preprocedural examination: Secondary | ICD-10-CM | POA: Diagnosis not present

## 2021-03-04 DIAGNOSIS — M150 Primary generalized (osteo)arthritis: Secondary | ICD-10-CM | POA: Diagnosis not present

## 2021-03-04 DIAGNOSIS — Z79899 Other long term (current) drug therapy: Secondary | ICD-10-CM | POA: Diagnosis not present

## 2021-03-04 DIAGNOSIS — M2550 Pain in unspecified joint: Secondary | ICD-10-CM | POA: Diagnosis not present

## 2021-03-04 DIAGNOSIS — E663 Overweight: Secondary | ICD-10-CM | POA: Diagnosis not present

## 2021-03-04 DIAGNOSIS — R739 Hyperglycemia, unspecified: Secondary | ICD-10-CM | POA: Diagnosis not present

## 2021-03-04 DIAGNOSIS — E559 Vitamin D deficiency, unspecified: Secondary | ICD-10-CM | POA: Diagnosis not present

## 2021-03-04 DIAGNOSIS — M059 Rheumatoid arthritis with rheumatoid factor, unspecified: Secondary | ICD-10-CM | POA: Diagnosis not present

## 2021-03-22 ENCOUNTER — Encounter: Payer: Self-pay | Admitting: Cardiovascular Disease

## 2021-03-22 ENCOUNTER — Ambulatory Visit (INDEPENDENT_AMBULATORY_CARE_PROVIDER_SITE_OTHER): Payer: BC Managed Care – PPO | Admitting: Cardiovascular Disease

## 2021-03-22 ENCOUNTER — Other Ambulatory Visit: Payer: Self-pay

## 2021-03-22 VITALS — BP 138/82 | HR 53 | Ht 72.0 in | Wt 218.0 lb

## 2021-03-22 DIAGNOSIS — Z7901 Long term (current) use of anticoagulants: Secondary | ICD-10-CM | POA: Diagnosis not present

## 2021-03-22 DIAGNOSIS — E785 Hyperlipidemia, unspecified: Secondary | ICD-10-CM | POA: Diagnosis not present

## 2021-03-22 DIAGNOSIS — I2510 Atherosclerotic heart disease of native coronary artery without angina pectoris: Secondary | ICD-10-CM

## 2021-03-22 DIAGNOSIS — G4731 Primary central sleep apnea: Secondary | ICD-10-CM

## 2021-03-22 DIAGNOSIS — G4733 Obstructive sleep apnea (adult) (pediatric): Secondary | ICD-10-CM | POA: Diagnosis not present

## 2021-03-22 DIAGNOSIS — Z79899 Other long term (current) drug therapy: Secondary | ICD-10-CM | POA: Diagnosis not present

## 2021-03-22 DIAGNOSIS — I480 Paroxysmal atrial fibrillation: Secondary | ICD-10-CM | POA: Diagnosis not present

## 2021-03-22 MED ORDER — EZETIMIBE 10 MG PO TABS: 10.0000 mg | ORAL_TABLET | Freq: Every day | ORAL | 3 refills | Status: DC

## 2021-03-22 NOTE — Patient Instructions (Addendum)
Medication Instructions:  Start  Taking Zetia 10 mg  Daily   Along with all the other medications  *If you need a refill on your cardiac medications before your next appointment, please call your pharmacy*   Lab Work:  Not needed   Testing/Procedures: Not needed   Follow-Up: At [facility], you and your health needs are our priority.  As part of our continuing mission to provide you with exceptional heart care, we have created designated Provider Care Teams.  These Care Teams include your primary Cardiologist (physician) and Advanced Practice Providers (APPs -  Physician Assistants and Nurse Practitioners) who all work together to provide you with the care you need, when you need it.     Your next appointment:   6 month(s)  The format for your next appointment:   In Person  Provider:   Shelva Majestic, MD   Other Instructions  use your cpap machine

## 2021-03-22 NOTE — Progress Notes (Signed)
Patient ID: [name], male   DOB: Mar 26, 1951, 70 y.o.   MRN: [id number]    Primary M.D.: Dr. Deland Mcclure  HPI: Nathan Mcclure is a 70 y.o. male who presents to the office for a 5 month followup cardiology/sleep evaluation.  Nathan Mcclure has a history of mild CAD initially found in 2000. His last cardiac catheterization in May 2005 revealed  mild nonobstructive CAD involving the LAD, intermediate, and RCA treated medically. He has a history of hyperlipidemia. He's been diagnosed with rheumatoid arthritis. He had done well with beta blocker therapy for many years but last year stopped taking this when his prescription had run out and he was seen as an add-on on 01/24/2014.  He was found to be in atrial fibrillation with ventricular rate at 110 beats per minute. He was restarted on low-dose metoprolol tartrate at 25 mg twice a day. Laboratory showed a TSH that was normal at 1.12. He had normal CMP profile. Creatinine was 0.85. He was started on Xarelto 20 mg with  normal hemoglobin and hematocrit. I further increased his metoprolol to 50 mg twice a day. An echo Doppler study  on 02/23/2014  showed an ejection fraction of 50-55%. There was mild to moderate mitral regurgitation. His left atrium measured 42 mm.  He was initially started on Propofenone150 mg every 8 hours and when attempted to be titrated to 225 mg 3 times a day he was unable to tolerate the increased dose of Propofenone due to nausea and reduced his dose back to 150 mg.   Due to concerns for significant obstructive sleep apnea with excessive daytime sleepiness he  underwent a sleep study which confirmed moderate sleep apnea with an AHI of 15.1.  He dropped his oxygen 87%.  There was heavy snoring.  He had a significant delay in ultimately undergoing a CPAP titration due to his canceling of the study, but ultimately a CPAP trial was done on 06/29/2014.  He was found to develop central apneic events in ultimately was switched to BiPAP.   He was titrated up to an IPAP of 16 and EPAP of 12, but continued to have centrals during sleep-wake junctions.  He has an AirCurve 10 Auto ResMed BiPAP unit and his current settings are for minimum EPAP of 10 with a maximum IPAP of 20.  A download was obtained from October 16 through 11/04/2014.  He met compliance with days of use being 80% but days greater than 4 hours was only 27%.  He was averaging 3 hours and 43 minutes of use.  Upon further questioning, he states as the pressure would build up  he would develop significant leakage  around the seal in the mask and mask would essentially blow off his face.  For this reason, he has not been able to be compliant.  He brings his unit with him and mask to the office today for further evaluation and assessment. A review of his download indicates a significant increase leak at 97.6.  His AHI was 13.4.  He underwent an initial cardioversion  on 11/20/2014.  At that time, he converted with just 1 shock at 120 J to normal sinus rhythm.   When I saw him in 2016 he had not been compliant with his medications and was only intermittently using BiPAP 2 times per week.  Instead of  taking his metoprolol tartrate 75 mg twice a day he was only taking this 50 mg daily.  He had continued Xarelto 20 mg for  anticoagulation.  He began to notice that he was feeling more chest fluttering.  He denied chest pain.  He denies PND or orthopnea.  He has been using [employer] for his DME company.  When I saw him in November 2016 he was in atrial fibrillation of questionable duration. His resting pulse was 101 and I increased his metoprolol to 75 mg twice a day. He has felt improved. He also has resumed using BiPAP therapy and admits that he feels so much better now that he has been using this on a daily basis.  When seen in December.  He was started on amiodarone 200 mg a day for 1 week and was then titrated to 2 mg twice a day.  He continued to be in atrial fibrillation.  On  02/19/2016 he underwent successful DC cardioversion and required 3 shocks at 120, 150, and ultimately cardioverted at 200 J.  His postprocedure ECG showed sinus bradycardia.  Subsequently, he has been on a reduced dose of metoprolol at 50 mg twice a day but has continued to take the amiodarone 200 mg twice a day.  He omits to 100% compliance with CPAP use.  He continues to be on Xarelto for anticoagulation.  He admits to resting pulse is in the 40s to at times. When I saw for post cardioversion follow-up  I reduced his metoprolol from 50 mg twice a day to 25 mg twice a day.  I again discussed importance of continued treatment for his obstructive sleep apnea.    He developed a kidney stone and underwent successful lithotripsy and held Xarelto for the procedure.  He is still working at [employer].  He has moved into an apartment complex.  Over the past month, he admits to a 10 pound weight gain.  He is walking over 20,000 steps per day.  He denies chest pain or breakthrough atrial fibrillation.  He admits to 100% compliance with BiPAP therapy.  He was recently started on Humira for his rheumatoid arthritis and is followed by Dr. Trudie Mcclure at [facility].   When I  saw him in September 2018 he had stopped taking Xarelto 2 months previously due to concerns of getting into the "doughnut hole."  He was not aware of any recurrent episodes of atrial fibrillation.  He continued to take amiodarone 200 mg daily, losartan 50 mg daily, and doxasocin4 mg.  He icontinued to be on Crestor 20 mg for hyperlipidemia.  In May 2018.  Total cholesterol was 166, triglycerides 79, LDL 103.  LFTs were normal.  He was using  BiPAP with 100% compliance.  Based on his CHA2DS2-VASc score I recommended resumption of anticoagulation which he agreed and provided him with samples to initiate therapy.  He was evaluated in December 2019 by Nathan Deforest, PA-C for preoperative evaluation.  He has had issues with kidney stones.    I saw him   in December 2020.  Over the prior year he had a right shoulder rotator cuff tear required surgery.  He is now working for [employer] to soon retire again.  He is walking all day at work.  He denies any episodes of chest pain or awareness of recurrent atrial fibrillation.  He states that he had only been intermittently using BiPAP but a download was attempted to be obtained today which did not reveal any recent use.  [employer] is his DME company.  He has not had recent laboratory.    I last saw him in December 2021.  He had been diagnosed with prostate CA and had undergone brachytherapy and has had active seed implants.  He is followed by Dr. Jeffie Pollock.  He lives by himself.  He is now working at [employer].  His rhythm has been stable but he believes he may have experienced 1 brief short-lived episode of heart rate irregularity lasted 20 minutes.  At that time, he had not been using BiPAP and I strongly recommended resumption of therapy and change his mask to a ResMed AirFit N 30i mask.  Presently, he denies any episodes of chest pain.  He may require left shoulder surgery in the future with Dr. French Ana.  He is not aware of any recurrent episodes of atrial fibrillation.  He has been intermittently using BiPAP therapy but over the past month started to use BiPAP since March 14.  Download was obtained from March 2 until March 31 which showed resumption of usage on March 14.  His 95th percentile BiPAP pressure is 12.2/8.2 with a maximum average pressure of 12.603.6 and he has a minimum EPAP set at 7 with maximum potential IPAP of 25.  AHI is 2.4/h.  An Epworth Sleepiness Scale score was recalculated in the office today and this endorsed at 6, arguing against significant residual daytime sleepiness.  He presents for follow-up evaluation.  Past Surgical History:  Procedure Laterality Date  . BACK SURGERY  2010   lower  . CARDIAC CATHETERIZATION  05/14/2004   Contiued medical therapy  . CARDIOVASCULAR  STRESS TEST  09/19/2009   Observed defect is consistent with diaphragmatic attentuation. EKG negative for ischemia.  Marland Kitchen CARDIOVERSION N/A 11/20/2014   Procedure: CARDIOVERSION;  Surgeon: Troy Sine, MD;  Location: [facility] ENDOSCOPY;  Service: Cardiovascular;  Laterality: N/A;  . CARDIOVERSION N/A 02/19/2016   Procedure: CARDIOVERSION;  Surgeon: Troy Sine, MD;  Location: [place];  Service: Cardiovascular;  Laterality: N/A;  . CERVICAL FUSION  2005  . CYSTOSCOPY N/A 10/25/2020   Procedure: CYSTOSCOPY;  Surgeon: Irine Seal, MD;  Location: [facility];  Service: Urology;  Laterality: N/A;  . EXTRACORPOREAL SHOCK WAVE LITHOTRIPSY Right 05/09/2019   Procedure: EXTRACORPOREAL SHOCK WAVE LITHOTRIPSY (ESWL);  Surgeon: Festus Aloe, MD;  Location: [facility] ORS;  Service: Urology;  Laterality: Right;  . [place], 2007   stent insertion, stone basket   . PROSTATE BIOPSY  2021  . RADIOACTIVE SEED IMPLANT N/A 10/25/2020   Procedure: RADIOACTIVE SEED IMPLANT/BRACHYTHERAPY IMPLANT;  Surgeon: Irine Seal, MD;  Location: [facility];  Service: Urology;  Laterality: N/A;  . RESECTION DISTAL CLAVICAL Right 07/06/2019   Procedure: OPEN DISTAL CLAVICLE EXCISION;  Surgeon: Earlie Server, MD;  Location: [place];  Service: Orthopedics;  Laterality: Right;  . SHOULDER ARTHROSCOPY WITH ROTATOR CUFF REPAIR AND SUBACROMIAL DECOMPRESSION Right 07/06/2019   Procedure: SHOULDER ARTHROSCOPY WITH OPEN ROTATOR CUFF REPAIR, SUBACROMIAL DECOMPRESSION WITH EXTENSIVE DEBRIDEMENT;  Surgeon: Earlie Server, MD;  Location: [place];  Service: Orthopedics;  Laterality: Right;  . SPACE OAR INSTILLATION N/A 10/25/2020   Procedure: SPACE OAR INSTILLATION;  Surgeon: Irine Seal, MD;  Location: [facility];  Service: Urology;  Laterality: N/A;  . TONSILLECTOMY     as a child  . TRANSTHORACIC ECHOCARDIOGRAM  03/29/2008   EF 67%, mild mitral  and tricuspid valve regurg.    Allergies  Allergen Reactions  . Penicillins     Childhood allergy    Current Outpatient Medications  Medication Sig Dispense Refill  .  amiodarone (PACERONE) 200 MG tablet Take 0.5 tablets (100 mg total) by mouth daily. 45 tablet 2  . celecoxib (CELEBREX) 200 MG capsule Take 200 mg by mouth daily.    Marland Kitchen ezetimibe (ZETIA) 10 MG tablet Take 1 tablet (10 mg total) by mouth daily. 90 tablet 3  . fexofenadine (ALLEGRA) 180 MG tablet Take 180 mg by mouth at bedtime.     . fluticasone (FLONASE) 50 MCG/ACT nasal spray Place into both nostrils as needed.    . Glucos-Chondroit-Hyaluron-MSM (GLUCOSAMINE CHONDROITIN JOINT PO) Take 2 tablets by mouth 2 (two) times daily.    . lansoprazole (PREVACID) 15 MG capsule Take 15 mg by mouth at bedtime.    Marland Kitchen losartan (COZAAR) 50 MG tablet TAKE 1 TABLET BY MOUTH TWICE A DAY (Patient taking differently: daily.) 180 tablet 2  . Multiple Vitamin (MULTIVITAMIN) tablet Take 1 tablet by mouth daily.    Marland Kitchen oxyCODONE (OXY IR/ROXICODONE) 5 MG immediate release tablet Take one tab po q4-6hrs prn pain, may need 1-2 first couple weeks 40 tablet 0  . rivaroxaban (XARELTO) 20 MG TABS tablet TAKE 1 TABLET (20 MG TOTAL) BY MOUTH DAILY WITH SUPPER. 90 tablet 1  . rosuvastatin (CRESTOR) 20 MG tablet Take 1 tablet (20 mg total) by mouth daily. 90 tablet 3  . sertraline (ZOLOFT) 100 MG tablet Take 100 mg by mouth daily.    . tadalafil (CIALIS) 5 MG tablet Take 5 mg by mouth at bedtime. For bph     No current facility-administered medications for this visit.    Social History   Socioeconomic History  . Marital status: Widowed    Spouse name: Not on file  . Number of children: Not on file  . Years of education: Not on file  . Highest education level: Not on file  Occupational History  . Occupation: retired    Comment: [employer]  Tobacco Use  . Smoking status: Former Smoker    Packs/day: 0.25    Years: 40.00    Pack years: 10.00    Types:  Cigarettes    Quit date: 01/25/2004    Years since quitting: 17.1  . Smokeless tobacco: Never Used  Vaping Use  . Vaping Use: Never used  Substance and Sexual Activity  . Alcohol use: Not Currently    Alcohol/week: 4.0 standard drinks    Types: 4 Standard drinks or equivalent per week    Comment: couple times a month  . Drug use: No  . Sexual activity: Not Currently  Other Topics Concern  . Not on file  Social History Narrative   One of nine siblings (6 girls and 3 boys). Lost wife to cancer of the sinus approximately ten years ago (2011).    Social Determinants of Health   Financial Resource Strain: Not on file  Food Insecurity: Not on file  Transportation Needs: Not on file  Physical Activity: Not on file  Stress: Not on file  Social Connections: Not on file  Intimate Partner Violence: Not on file   Socially he is widowed for 4 years.  There is no tobacco use.  He had broken up with his fiance.  Family History  Problem Relation Age of Onset  . Diabetes Mother   . Kidney failure Mother   . Heart disease Mother   . Prostate cancer Father   . Alzheimer's disease Father   . Brain cancer Sister   . Lung cancer Brother   . Heart disease Sister   . Tremor Daughter   .  Alopecia Daughter     ROS General: Negative; No fevers, chills, or night sweats;  HEENT: Negative; No changes in vision or hearing, sinus congestion, difficulty swallowing Pulmonary: Negative; No cough, wheezing, shortness of breath, hemoptysis Cardiovascular:  unaware of his atrial fibrillation; No chest pain, presyncope, syncope,  GI: Negative; No nausea, vomiting, diarrhea, or abdominal pain GU: Prostate CA, radioactive seed implantation Musculoskeletal: Positive for cervical and lumbar disc disease; right shoulder rotator cuff tear  Hematologic/Oncology: Negative; no easy bruising, bleeding Endocrine: Negative; no heat/cold intolerance; no diabetes Neuro: Negative; no changes in balance,  headaches Skin: Negative; No rashes or skin lesions Psychiatric: Negative; No behavioral problems, depression Sleep: Positive for moderate complex obstructive sleep apnea on BiPAP with set up date August 16, 2014 [employer] was his DME company which has been brought out by [employer].  Not use therapy for some time.  Other comprehensive 14 point system review is negative.  PE BP 138/82   Pulse (!) 53   Ht 6' (1.829 m)   Wt 218 lb (98.9 kg)   SpO2 98%   BMI 29.57 kg/m    Repeat blood pressure by me was 166/90.  Wt Readings from Last 3 Encounters:  03/22/21 218 lb (98.9 kg)  12/27/20 215 lb 6 oz (97.7 kg)  11/28/20 218 lb (98.9 kg)   General: Alert, oriented, no distress.  Skin: normal turgor, no rashes, warm and dry HEENT: Normocephalic, atraumatic. Pupils equal round and reactive to light; sclera anicteric; extraocular muscles intact;  Nose without nasal septal hypertrophy Mouth/Parynx benign; Mallinpatti scale 3 Neck: No JVD, no carotid bruits; normal carotid upstroke Lungs: clear to ausculatation and percussion; no wheezing or rales Chest wall: without tenderness to palpitation Heart: PMI not displaced, RRR, s1 s2 normal, 1/6 systolic murmur, no diastolic murmur, no rubs, gallops, thrills, or heaves Abdomen: soft, nontender; no hepatosplenomehaly, BS+; abdominal aorta nontender and not dilated by palpation. Back: no CVA tenderness Pulses 2+ Musculoskeletal: full range of motion, normal strength, no joint deformities Extremities: no clubbing cyanosis or edema, Homan's sign negative  Neurologic: grossly nonfocal; Cranial nerves grossly wnl Psychologic: Normal mood and affect  ECG (independently read by me): Sinus bradycardia at 53; [facility]  November 28, 2020 ECG (independently read by me): Simnus bradycardia at 48; no ectopy, normal intervals  December 01, 2019 ECG (independently read by me): Sinus bradycardia 56 bpm.  Nonspecific ST changes.  QTc interval 430 ms  September 2018  ECG (independently read by me): sinus bradycardia at 46 bpm with sinus arrhythmia.  Normal intervals.  December 2017 ECG (independently read by me): Sinus bradycardia 52 bpm.  QTc interval 396 ms.  PR interval 168 ms.  No ectopy.  September 2017 ECG (independently read by me): Marked sinus bradycardia with a heart rate at 37 bpm.  PR interval 180 ms.  QTc interval 414 ms.  May 2017 ECG (independently read by me): Marked sinus bradycardia with a ventricular rate of 37 bpm with sinus arrhythmia  March 2017 ECG (independently read by me): Marked sinus bradycardia 41 bpm.  Nondiagnostic T changes.  QTc interval 528 ms.  12/14/2015 ECG (independently read by me):  Atrial fibrillation at 90 bpm.  Nonspecific T changes.  QTc interval 442 ms.  November 12 2015 ECG (independently read by me): Atrial fibrillation with a rapid ventricular response at 10 1 bpm.  Nonspecific ST-T changes.  11/09/2014 ECG (independently read by me): Atrial fibrillation with controlled ventricular response in the 70s.  QTc interval 407 ms.  June 2015 ECG (independently read by me): Atrial fibrillation with a ventricular rate at 100 beats per minute.  QTc interval 428 ms.  Nonspecific T changes  05/08/2014 ECG (independently read by me): atrial fibrillation at 94 beats per minute.  QTc interval 425 ms.  02/23/2014 ECG (independently read by me) atrial fibrillation at 98 beats per minute. Nonspecific ST-T changes   Prior 02/08/2014 ECG (independently read by me): Atrial fibrillation with a rapid ventricular response at 128 beats per minute with nonspecific ST-T changes.   LABS: Laboratory from [facility] from 05/21/2017 has been reviewed.  H/H 13.2/41.0.  C-reactive protein 1.8.  BUN 21, creatinine 0.96.  LFTs normal.  BMP Latest Ref Rng & Units 10/22/2020 02/13/2016 12/10/2015  Glucose 70 - 99 mg/dL 104(H) 73 91  BUN 8 - 23 mg/dL _0 Creatinine 0.61 - 1.24 mg/dL 0.93 0.99 0.85  Sodium 135 - 145 mmol/L  144 144 139  Potassium 3.5 - 5.1 mmol/L 4.0 4.2 4.4  Chloride 98 - 111 mmol/L 109 106 105  CO2 22 - 32 mmol/L _1 Calcium 8.9 - 10.3 mg/dL 8.8(L) 8.7 8.8   Hepatic Function Latest Ref Rng & Units 10/22/2020 12/10/2015 11/15/2014  Total Protein 6.5 - 8.1 g/dL 7.1 6.7 6.9  Albumin 3.5 - 5.0 g/dL 3.9 4.1 4.2  AST 15 - 41 U/L _2 ALT 0 - 44 U/L _3 Alk Phosphatase 38 - 126 U/L 77 53 60  Total Bilirubin 0.3 - 1.2 mg/dL 0.9 0.7 0.8   CBC Latest Ref Rng & Units 10/22/2020 02/13/2016 12/10/2015  WBC 4.0 - 10.5 K/uL 7.5 6.4 5.4  Hemoglobin 13.0 - 17.0 g/dL 13.4 14.8 13.5  Hematocrit 39.0 - 52.0 % 41.1 44.6 39.4  Platelets 150 - 400 K/uL 230 202 177   Lab Results  Component Value Date   MCV 94.3 10/22/2020   MCV 94.7 02/13/2016   MCV 94.0 12/10/2015   Lab Results  Component Value Date   TSH 1.899 10/22/2020  No results found for: HGBA1C   Lipid Panel     Component Value Date/Time   CHOL 258 (H) 10/22/2020 1307   TRIG 84 10/22/2020 1307   HDL 45 10/22/2020 1307   CHOLHDL 5.7 10/22/2020 1307   VLDL 17 10/22/2020 1307   LDLCALC 196 (H) 10/22/2020 1307    RADIOLOGY: No results found.  IMPRESSION:  1. Paroxysmal atrial fibrillation (HCC)   2. Hyperlipidemia LDL goal <70   3. Coronary artery disease involving native coronary artery of native heart without angina pectoris      ASSESSMENT AND PLAN: Nathan Mcclure is a 70 year-old gentleman who has mild nonobstructive coronary disease. He developed atrial fibrillation after stopping his metoprolol in 2015.  An echo Doppler study confirmed an ejection fraction of 50-55% with mild-to-moderate mitral regurgitation and mild biatrial enlargement. Remotely he had been on propofenone and metoprolol.since his cardioversion in February 2017.  He has been maintaining sinus rhythm and has been without awareness of recurrent arrhythmia.  He has complex sleep apnea and had initiated BiPAP therapy on August 16, 2014.  At prior  evaluations he had not been using therapy.  However at his last evaluation I stressed the importance of resumption of use particularly with reference to potential recurrence of atrial fibrillation.  Since March 14 he has resumed treatment.  Therapy AHI is 2.4 and his 95th percentile pressure is 12.2/80 2 cm of water.  He is maintaining sinus  rhythm he continues to be on low-dose amiodarone at 100 mg daily.  He is on Xarelto 20 mg for anticoagulation and has been without bleeding.  He has been on losartan 50 mg twice a day, for blood pressure control.  Blood pressure today was slightly increased at 138/82.  I discussed target blood pressure less than 130/80 with ideal less than 120/80.  He will monitor his blood pressure at home.  I reviewed recent lipid studies which showed a total cholesterol 148, triglycerides 105, VLDL 19, HDL 42 and LDL 87.  He has been on rosuvastatin 20 mg daily.  I have suggested the addition of Zetia 10 mg for target LDL less than 70.  He is followed by Dr. Sandrea Matte for his prostate CA and has seed implants.  In the future he may require left shoulder surgery with Dr.Caffrey.  I will see him in 6 months for reevaluation.   Troy Sine, MD, [facility]  03/22/2021 6:00 PM

## 2021-03-23 ENCOUNTER — Encounter: Payer: Self-pay | Admitting: Cardiovascular Disease

## 2021-03-29 ENCOUNTER — Ambulatory Visit: Payer: BC Managed Care – PPO | Admitting: Cardiovascular Disease

## 2021-04-13 ENCOUNTER — Other Ambulatory Visit: Payer: Self-pay | Admitting: Cardiovascular Disease

## 2021-04-17 ENCOUNTER — Ambulatory Visit: Payer: Self-pay | Admitting: Physician Assistant

## 2021-04-17 ENCOUNTER — Encounter (HOSPITAL_BASED_OUTPATIENT_CLINIC_OR_DEPARTMENT_OTHER): Payer: Self-pay | Admitting: Orthopedic Surgery

## 2021-04-17 NOTE — H&P (View-Only) (Signed)
Nathan Mcclure is an 70 y.o. male.   Chief Complaint: left shoulder pain HPI: Nathan Mcclure is here to discuss left shoulder surgery, which he was contemplating at his last visit.  He had to have treatment for prostate cancer, which he thinks has gone well.  He has a history of atrial fibrillation.  He sees Dr. Claiborne Billings.  He has some chronic pain issues and is seeing Dr. Towanda Malkin group for b.i.d. oxycodone.  Scan of the involved left shoulder was done 08/11/2020 and showed near complete tear of the supraspinatus with a few intact fibers.  He probably has progressed on to complete tear.  He does have arthropathy of the [facility] joint.  Past Medical History:  Diagnosis Date  . Anxiety   . Arthritis   . Atrial fibrillation ([place])   . Benign essential tremor    no meds taken  . Coronary artery disease    history of Atrial Fib.  . Depression   . GERD (gastroesophageal reflux disease)   . History of kidney stones   . Hyperlipidemia   . Hypertension   . Low testosterone   . Palpitations   . Prostate cancer ([place])   . Rheumatoid arthritis ([place])   . Sleep apnea    has CPAP but not worn in 6 months    Past Surgical History:  Procedure Laterality Date  . BACK SURGERY  2010   lower  . CARDIAC CATHETERIZATION  05/14/2004   Contiued medical therapy  . CARDIOVASCULAR STRESS TEST  09/19/2009   Observed defect is consistent with diaphragmatic attentuation. EKG negative for ischemia.  Marland Kitchen CARDIOVERSION N/A 11/20/2014   Procedure: CARDIOVERSION;  Surgeon: Troy Sine, MD;  Location: [facility] ENDOSCOPY;  Service: Cardiovascular;  Laterality: N/A;  . CARDIOVERSION N/A 02/19/2016   Procedure: CARDIOVERSION;  Surgeon: Troy Sine, MD;  Location: [facility];  Service: Cardiovascular;  Laterality: N/A;  . CERVICAL FUSION  2005  . CYSTOSCOPY N/A 10/25/2020   Procedure: CYSTOSCOPY;  Surgeon: Irine Seal, MD;  Location: [facility];  Service: Urology;  Laterality: N/A;  . EXTRACORPOREAL SHOCK WAVE LITHOTRIPSY  Right 05/09/2019   Procedure: EXTRACORPOREAL SHOCK WAVE LITHOTRIPSY (ESWL);  Surgeon: Festus Aloe, MD;  Location: [facility];  Service: Urology;  Laterality: Right;  . [employer], 2007   stent insertion, stone basket   . PROSTATE BIOPSY  2021  . RADIOACTIVE SEED IMPLANT N/A 10/25/2020   Procedure: RADIOACTIVE SEED IMPLANT/BRACHYTHERAPY IMPLANT;  Surgeon: Irine Seal, MD;  Location: [facility];  Service: Urology;  Laterality: N/A;  . RESECTION DISTAL CLAVICAL Right 07/06/2019   Procedure: OPEN DISTAL CLAVICLE EXCISION;  Surgeon: Earlie Server, MD;  Location: [place];  Service: Orthopedics;  Laterality: Right;  . SHOULDER ARTHROSCOPY WITH ROTATOR CUFF REPAIR AND SUBACROMIAL DECOMPRESSION Right 07/06/2019   Procedure: SHOULDER ARTHROSCOPY WITH OPEN ROTATOR CUFF REPAIR, SUBACROMIAL DECOMPRESSION WITH EXTENSIVE DEBRIDEMENT;  Surgeon: Earlie Server, MD;  Location: [place];  Service: Orthopedics;  Laterality: Right;  . SPACE OAR INSTILLATION N/A 10/25/2020   Procedure: SPACE OAR INSTILLATION;  Surgeon: Irine Seal, MD;  Location: [facility];  Service: Urology;  Laterality: N/A;  . TONSILLECTOMY     as a child  . TRANSTHORACIC ECHOCARDIOGRAM  03/29/2008   EF 67%, mild mitral and tricuspid valve regurg.    Family History  Problem Relation Age of Onset  . Diabetes Mother   . Kidney failure Mother   . Heart disease Mother   .  Prostate cancer Father   . Alzheimer's disease Father   . Brain cancer Sister   . Lung cancer Brother   . Heart disease Sister   . Tremor Daughter   . Alopecia Daughter    Social History:  reports that he quit smoking about 17 years ago. His smoking use included cigarettes. He has a 10.00 pack-year smoking history. He has never used smokeless tobacco. He reports previous alcohol use of about 4.0 standard drinks of alcohol per week. He reports that he does not use drugs.  Allergies:   Allergies  Allergen Reactions  . Penicillins     Childhood allergy    (Not in a hospital admission)   No results found for this or any previous visit (from the past 48 hour(s)). No results found.  Review of Systems  Musculoskeletal: Positive for arthralgias.  All other systems reviewed and are negative.   There were no vitals taken for this visit. Physical Exam Constitutional:      Appearance: Normal appearance.  HENT:     Head: Normocephalic and atraumatic.  Eyes:     Extraocular Movements: Extraocular movements intact.     Pupils: Pupils are equal, round, and reactive to light.  Cardiovascular:     Rate and Rhythm: Normal rate.     Pulses: Normal pulses.  Pulmonary:     Effort: Pulmonary effort is normal. No respiratory distress.  Abdominal:     General: Abdomen is flat. There is no distension.  Musculoskeletal:     Left shoulder: Tenderness present. Decreased range of motion. Decreased strength.     Cervical back: Normal range of motion and neck supple.  Skin:    General: Skin is warm and dry.     Findings: No erythema or rash.  Neurological:     General: No focal deficit present.     Mental Status: He is alert and oriented to person, place, and time.  Psychiatric:        Mood and Affect: Mood normal.        Behavior: Behavior normal.      Assessment/Plan He is a candidate for arthroscopy, acromioplasty, debridement, distal clavicle, probable open rotator cuff repair.  Risks and benefits are discussed in detail.  He had the opposite shoulder done in 2020.  That shoulder had a very large tear, but the risk profile is similar as before.  He will have clearance needed by Dr. Shelia Media and Dr. Claiborne Billings, as well as notification of Dr. Towanda Malkin office that he will be having surgery at some point in the future.  Followup pending surgery date.  Chriss Czar, PA-C 04/17/2021, 9:34 PM

## 2021-04-17 NOTE — H&P (Signed)
Nathan Mcclure is an 70 y.o. male.   Chief Complaint: left shoulder pain HPI: Nathan Mcclure is here to discuss left shoulder surgery, which he was contemplating at his last visit.  He had to have treatment for prostate cancer, which he thinks has gone well.  He has a history of atrial fibrillation.  He sees Dr. Claiborne Billings.  He has some chronic pain issues and is seeing Dr. Towanda Malkin group for b.i.d. oxycodone.  Scan of the involved left shoulder was done 08/11/2020 and showed near complete tear of the supraspinatus with a few intact fibers.  He probably has progressed on to complete tear.  He does have arthropathy of the [facility] joint.  Past Medical History:  Diagnosis Date  . Anxiety   . Arthritis   . Atrial fibrillation ([place])   . Benign essential tremor    no meds taken  . Coronary artery disease    history of Atrial Fib.  . Depression   . GERD (gastroesophageal reflux disease)   . History of kidney stones   . Hyperlipidemia   . Hypertension   . Low testosterone   . Palpitations   . Prostate cancer ([place])   . Rheumatoid arthritis ([place])   . Sleep apnea    has CPAP but not worn in 6 months    Past Surgical History:  Procedure Laterality Date  . BACK SURGERY  2010   lower  . CARDIAC CATHETERIZATION  05/14/2004   Contiued medical therapy  . CARDIOVASCULAR STRESS TEST  09/19/2009   Observed defect is consistent with diaphragmatic attentuation. EKG negative for ischemia.  Marland Kitchen CARDIOVERSION N/A 11/20/2014   Procedure: CARDIOVERSION;  Surgeon: Troy Sine, MD;  Location: [facility] ENDOSCOPY;  Service: Cardiovascular;  Laterality: N/A;  . CARDIOVERSION N/A 02/19/2016   Procedure: CARDIOVERSION;  Surgeon: Troy Sine, MD;  Location: [place];  Service: Cardiovascular;  Laterality: N/A;  . CERVICAL FUSION  2005  . CYSTOSCOPY N/A 10/25/2020   Procedure: CYSTOSCOPY;  Surgeon: Irine Seal, MD;  Location: [facility];  Service: Urology;  Laterality: N/A;  . EXTRACORPOREAL SHOCK WAVE LITHOTRIPSY  Right 05/09/2019   Procedure: EXTRACORPOREAL SHOCK WAVE LITHOTRIPSY (ESWL);  Surgeon: Festus Aloe, MD;  [facility];  Service: Urology;  Laterality: Right;  . [place], 2007   stent insertion, stone basket   . PROSTATE BIOPSY  2021  . RADIOACTIVE SEED IMPLANT N/A 10/25/2020   Procedure: RADIOACTIVE SEED IMPLANT/BRACHYTHERAPY IMPLANT;  Surgeon: Irine Seal, MD;  Location: [facility];  Service: Urology;  Laterality: N/A;  . RESECTION DISTAL CLAVICAL Right 07/06/2019   Procedure: OPEN DISTAL CLAVICLE EXCISION;  Surgeon: Earlie Server, MD;  Location: [place];  Service: Orthopedics;  Laterality: Right;  . SHOULDER ARTHROSCOPY WITH ROTATOR CUFF REPAIR AND SUBACROMIAL DECOMPRESSION Right 07/06/2019   Procedure: SHOULDER ARTHROSCOPY WITH OPEN ROTATOR CUFF REPAIR, SUBACROMIAL DECOMPRESSION WITH EXTENSIVE DEBRIDEMENT;  Surgeon: Earlie Server, MD;  Location: [place];  Service: Orthopedics;  Laterality: Right;  . SPACE OAR INSTILLATION N/A 10/25/2020   Procedure: SPACE OAR INSTILLATION;  Surgeon: Irine Seal, MD;  Location: [facility];  Service: Urology;  Laterality: N/A;  . TONSILLECTOMY     as a child  . TRANSTHORACIC ECHOCARDIOGRAM  03/29/2008   EF 67%, mild mitral and tricuspid valve regurg.    Family History  Problem Relation Age of Onset  . Diabetes Mother   . Kidney failure Mother   . Heart disease Mother   .  Prostate cancer Father   . Alzheimer's disease Father   . Brain cancer Sister   . Lung cancer Brother   . Heart disease Sister   . Tremor Daughter   . Alopecia Daughter    Social History:  reports that he quit smoking about 17 years ago. His smoking use included cigarettes. He has a 10.00 pack-year smoking history. He has never used smokeless tobacco. He reports previous alcohol use of about 4.0 standard drinks of alcohol per week. He reports that he does not use drugs.  Allergies:   Allergies  Allergen Reactions  . Penicillins     Childhood allergy    (Not in a hospital admission)   No results found for this or any previous visit (from the past 48 hour(s)). No results found.  Review of Systems  Musculoskeletal: Positive for arthralgias.  All other systems reviewed and are negative.   There were no vitals taken for this visit. Physical Exam Constitutional:      Appearance: Normal appearance.  HENT:     Head: Normocephalic and atraumatic.  Eyes:     Extraocular Movements: Extraocular movements intact.     Pupils: Pupils are equal, round, and reactive to light.  Cardiovascular:     Rate and Rhythm: Normal rate.     Pulses: Normal pulses.  Pulmonary:     Effort: Pulmonary effort is normal. No respiratory distress.  Abdominal:     General: Abdomen is flat. There is no distension.  Musculoskeletal:     Left shoulder: Tenderness present. Decreased range of motion. Decreased strength.     Cervical back: Normal range of motion and neck supple.  Skin:    General: Skin is warm and dry.     Findings: No erythema or rash.  Neurological:     General: No focal deficit present.     Mental Status: He is alert and oriented to person, place, and time.  Psychiatric:        Mood and Affect: Mood normal.        Behavior: Behavior normal.      Assessment/Plan He is a candidate for arthroscopy, acromioplasty, debridement, distal clavicle, probable open rotator cuff repair.  Risks and benefits are discussed in detail.  He had the opposite shoulder done in 2020.  That shoulder had a very large tear, but the risk profile is similar as before.  He will have clearance needed by Dr. Shelia Media and Dr. Claiborne Billings, as well as notification of Dr. Towanda Malkin office that he will be having surgery at some point in the future.  Followup pending surgery date.  Chriss Czar, PA-C 04/17/2021, 9:34 PM

## 2021-04-19 DIAGNOSIS — M4714 Other spondylosis with myelopathy, thoracic region: Secondary | ICD-10-CM | POA: Diagnosis not present

## 2021-04-19 DIAGNOSIS — M4712 Other spondylosis with myelopathy, cervical region: Secondary | ICD-10-CM | POA: Diagnosis not present

## 2021-04-19 DIAGNOSIS — M5106 Intervertebral disc disorders with myelopathy, lumbar region: Secondary | ICD-10-CM | POA: Diagnosis not present

## 2021-04-19 DIAGNOSIS — G894 Chronic pain syndrome: Secondary | ICD-10-CM | POA: Diagnosis not present

## 2021-04-22 ENCOUNTER — Other Ambulatory Visit (HOSPITAL_COMMUNITY)
Admission: RE | Admit: 2021-04-22 | Discharge: 2021-04-22 | Disposition: A | Discharge status: Home / Self Care | Payer: BC Managed Care – PPO | Source: Ambulatory Visit | Attending: Orthopedic Surgery | Admitting: Orthopedic Surgery

## 2021-04-22 DIAGNOSIS — Z01812 Encounter for preprocedural laboratory examination: Secondary | ICD-10-CM | POA: Insufficient documentation

## 2021-04-22 DIAGNOSIS — Z20822 Contact with and (suspected) exposure to covid-19: Secondary | ICD-10-CM | POA: Insufficient documentation

## 2021-04-22 NOTE — Anesthesia Preprocedure Evaluation (Addendum)
Anesthesia Evaluation  Patient identified by MRN, date of birth, ID band Patient awake    Reviewed: Allergy & Precautions, NPO status , Patient's Chart, lab work & pertinent test results  Airway Mallampati: II  TM Distance: >3 FB Neck ROM: Limited    Dental  (+) Teeth Intact, Dental Advisory Given, Chipped,    Pulmonary sleep apnea and Continuous Positive Airway Pressure Ventilation , former smoker,  Quit smoking 2005, 2ppd x 30 ppd    Pulmonary exam normal breath sounds clear to auscultation       Cardiovascular hypertension, Pt. on medications + CAD  Normal cardiovascular exam+ dysrhythmias (xarelto, amio- last dose 4/30) Atrial Fibrillation + Valvular Problems/Murmurs (mild- mod MR) MR  Rhythm:Regular Rate:Normal  Echo 2015: - Left ventricle: The cavity size was normal. Wall thickness  was increased in a pattern of mild LVH. Systolic function  was normal. The estimated ejection fraction was in the  range of 50% to 55%.  - Mitral valve: Mild to moderate regurgitation.  - Left atrium: The atrium was mildly dilated.  - Right atrium: The atrium was mildly dilated.     Neuro/Psych PSYCHIATRIC DISORDERS Anxiety Depression    GI/Hepatic Neg liver ROS, GERD  Medicated and Controlled,  Endo/Other  negative endocrine ROS  Renal/GU negative Renal ROS   Prostate ca    Musculoskeletal  (+) Arthritis , Rheumatoid disorders,  L shoulder rotator cuff tear- had right shoulder done in 2020 has some chronic pain issues- oxy 5mg  BID   Abdominal (+) + obese,   Peds  Hematology negative hematology ROS (+)   Anesthesia Other Findings   Reproductive/Obstetrics negative OB ROS                            Anesthesia Physical Anesthesia Plan  ASA: III  Anesthesia Plan: General and Regional   Post-op Pain Management: GA combined w/ Regional for post-op pain   Induction: Intravenous  PONV Risk  Score and Plan: Ondansetron, Dexamethasone, Midazolam and Treatment may vary due to age or medical condition  Airway Management Planned: Oral ETT and Video Laryngoscope Planned  Additional Equipment: None  Intra-op Plan:   Post-operative Plan: Extubation in OR  Informed Consent: I have reviewed the patients History and Physical, chart, labs and discussed the procedure including the risks, benefits and alternatives for the proposed anesthesia with the patient or authorized representative who has indicated his/her understanding and acceptance.     Dental advisory given  Plan Discussed with: CRNA  Anesthesia Plan Comments: (Required glidescope for last shoulder surgery )       Anesthesia Quick Evaluation

## 2021-04-23 LAB — SARS CORONAVIRUS 2 (TAT 6-24 HRS): SARS Coronavirus 2: NEGATIVE

## 2021-04-24 ENCOUNTER — Encounter (HOSPITAL_BASED_OUTPATIENT_CLINIC_OR_DEPARTMENT_OTHER)
Admission: RE | Disposition: A | Discharge status: Home / Self Care | Payer: Self-pay | Source: Ambulatory Visit | Attending: Orthopedic Surgery

## 2021-04-24 ENCOUNTER — Ambulatory Visit (HOSPITAL_BASED_OUTPATIENT_CLINIC_OR_DEPARTMENT_OTHER): Payer: BC Managed Care – PPO | Admitting: Anesthesiology

## 2021-04-24 ENCOUNTER — Other Ambulatory Visit: Payer: Self-pay

## 2021-04-24 ENCOUNTER — Encounter (HOSPITAL_BASED_OUTPATIENT_CLINIC_OR_DEPARTMENT_OTHER): Payer: Self-pay | Admitting: Orthopedic Surgery

## 2021-04-24 ENCOUNTER — Ambulatory Visit (HOSPITAL_BASED_OUTPATIENT_CLINIC_OR_DEPARTMENT_OTHER)
Admission: RE | Admit: 2021-04-24 | Discharge: 2021-04-25 | Disposition: A | Discharge status: Home / Self Care | Payer: BC Managed Care – PPO | Source: Ambulatory Visit | Attending: Orthopedic Surgery | Admitting: Orthopedic Surgery

## 2021-04-24 DIAGNOSIS — S46012A Strain of muscle(s) and tendon(s) of the rotator cuff of left shoulder, initial encounter: Secondary | ICD-10-CM | POA: Diagnosis not present

## 2021-04-24 DIAGNOSIS — G8918 Other acute postprocedural pain: Secondary | ICD-10-CM | POA: Diagnosis not present

## 2021-04-24 DIAGNOSIS — S43492A Other sprain of left shoulder joint, initial encounter: Secondary | ICD-10-CM | POA: Diagnosis not present

## 2021-04-24 DIAGNOSIS — Z87891 Personal history of nicotine dependence: Secondary | ICD-10-CM | POA: Diagnosis not present

## 2021-04-24 DIAGNOSIS — M7582 Other shoulder lesions, left shoulder: Secondary | ICD-10-CM | POA: Diagnosis not present

## 2021-04-24 DIAGNOSIS — Z8546 Personal history of malignant neoplasm of prostate: Secondary | ICD-10-CM | POA: Insufficient documentation

## 2021-04-24 DIAGNOSIS — G8929 Other chronic pain: Secondary | ICD-10-CM | POA: Insufficient documentation

## 2021-04-24 DIAGNOSIS — Z9889 Other specified postprocedural states: Secondary | ICD-10-CM

## 2021-04-24 DIAGNOSIS — X58XXXA Exposure to other specified factors, initial encounter: Secondary | ICD-10-CM | POA: Diagnosis not present

## 2021-04-24 DIAGNOSIS — S46112A Strain of muscle, fascia and tendon of long head of biceps, left arm, initial encounter: Secondary | ICD-10-CM | POA: Diagnosis not present

## 2021-04-24 DIAGNOSIS — M75122 Complete rotator cuff tear or rupture of left shoulder, not specified as traumatic: Secondary | ICD-10-CM | POA: Insufficient documentation

## 2021-04-24 DIAGNOSIS — M19012 Primary osteoarthritis, left shoulder: Secondary | ICD-10-CM | POA: Diagnosis not present

## 2021-04-24 DIAGNOSIS — M7542 Impingement syndrome of left shoulder: Secondary | ICD-10-CM | POA: Diagnosis not present

## 2021-04-24 DIAGNOSIS — Z79899 Other long term (current) drug therapy: Secondary | ICD-10-CM | POA: Insufficient documentation

## 2021-04-24 DIAGNOSIS — M24112 Other articular cartilage disorders, left shoulder: Secondary | ICD-10-CM | POA: Diagnosis not present

## 2021-04-24 DIAGNOSIS — S43432A Superior glenoid labrum lesion of left shoulder, initial encounter: Secondary | ICD-10-CM | POA: Diagnosis not present

## 2021-04-24 HISTORY — PX: SHOULDER ARTHROSCOPY WITH ROTATOR CUFF REPAIR AND SUBACROMIAL DECOMPRESSION: SHX5686

## 2021-04-24 HISTORY — PX: SHOULDER OPEN ROTATOR CUFF REPAIR: SHX2407

## 2021-04-24 SURGERY — SHOULDER ARTHROSCOPY WITH ROTATOR CUFF REPAIR AND SUBACROMIAL DECOMPRESSION
Anesthesia: Regional | Site: Shoulder | Laterality: Left

## 2021-04-24 MED ORDER — LACTATED RINGERS IV SOLN: INTRAVENOUS | Status: DC

## 2021-04-24 MED ORDER — ONDANSETRON HCL 4 MG/2ML IJ SOLN
INTRAMUSCULAR | Status: DC | PRN
  Administered 2021-04-24: 4 mg via INTRAVENOUS

## 2021-04-24 MED ORDER — SUCCINYLCHOLINE CHLORIDE 200 MG/10ML IV SOSY
PREFILLED_SYRINGE | INTRAVENOUS | Status: AC
  Filled 2021-04-24: qty 10

## 2021-04-24 MED ORDER — DOCUSATE SODIUM 100 MG PO CAPS
100.0000 mg | ORAL_CAPSULE | Freq: Two times a day (BID) | ORAL | Status: DC
  Filled 2021-04-24: qty 1

## 2021-04-24 MED ORDER — AMIODARONE HCL 100 MG PO TABS
100.0000 mg | ORAL_TABLET | Freq: Every day | ORAL | Status: DC
  Filled 2021-04-24: qty 1

## 2021-04-24 MED ORDER — PHENYLEPHRINE 40 MCG/ML (10ML) SYRINGE FOR IV PUSH (FOR BLOOD PRESSURE SUPPORT)
PREFILLED_SYRINGE | INTRAVENOUS | Status: DC | PRN
  Administered 2021-04-24: 120 ug via INTRAVENOUS

## 2021-04-24 MED ORDER — SUGAMMADEX SODIUM 200 MG/2ML IV SOLN
INTRAVENOUS | Status: DC | PRN
  Administered 2021-04-24: 200 mg via INTRAVENOUS

## 2021-04-24 MED ORDER — ACETAMINOPHEN 500 MG PO TABS
ORAL_TABLET | ORAL | Status: AC
  Filled 2021-04-24: qty 2

## 2021-04-24 MED ORDER — HYDROMORPHONE HCL 1 MG/ML IJ SOLN
INTRAMUSCULAR | Status: AC
  Filled 2021-04-24: qty 0.5

## 2021-04-24 MED ORDER — BUPIVACAINE LIPOSOME 1.3 % IJ SUSP
INTRAMUSCULAR | Status: DC | PRN
  Administered 2021-04-24: 10 mL via PERINEURAL

## 2021-04-24 MED ORDER — METOCLOPRAMIDE HCL 5 MG/ML IJ SOLN: 5.0000 mg | Freq: Three times a day (TID) | INTRAMUSCULAR | Status: DC | PRN

## 2021-04-24 MED ORDER — OXYCODONE HCL 5 MG PO TABS: 5.0000 mg | ORAL_TABLET | Freq: Once | ORAL | Status: DC | PRN

## 2021-04-24 MED ORDER — DEXAMETHASONE SODIUM PHOSPHATE 10 MG/ML IJ SOLN
INTRAMUSCULAR | Status: DC | PRN
  Administered 2021-04-24: 10 mg via INTRAVENOUS

## 2021-04-24 MED ORDER — LOSARTAN POTASSIUM 50 MG PO TABS
50.0000 mg | ORAL_TABLET | Freq: Two times a day (BID) | ORAL | Status: DC
  Administered 2021-04-24: 50 mg via ORAL
  Filled 2021-04-24: qty 1

## 2021-04-24 MED ORDER — PROPOFOL 500 MG/50ML IV EMUL
INTRAVENOUS | Status: AC
  Filled 2021-04-24: qty 50

## 2021-04-24 MED ORDER — SERTRALINE HCL 100 MG PO TABS
100.0000 mg | ORAL_TABLET | Freq: Every day | ORAL | Status: DC
  Filled 2021-04-24: qty 1

## 2021-04-24 MED ORDER — OXYCODONE HCL 5 MG PO TABS: ORAL_TABLET | ORAL | 0 refills | Status: AC

## 2021-04-24 MED ORDER — ROSUVASTATIN CALCIUM 20 MG PO TABS
20.0000 mg | ORAL_TABLET | Freq: Every day | ORAL | Status: DC
  Filled 2021-04-24: qty 1

## 2021-04-24 MED ORDER — CLINDAMYCIN PHOSPHATE 600 MG/50ML IV SOLN
INTRAVENOUS | Status: AC
  Filled 2021-04-24: qty 50

## 2021-04-24 MED ORDER — FENTANYL CITRATE (PF) 100 MCG/2ML IJ SOLN
100.0000 ug | Freq: Once | INTRAMUSCULAR | Status: AC
  Administered 2021-04-24: 100 ug via INTRAVENOUS

## 2021-04-24 MED ORDER — SODIUM CHLORIDE 0.9 % IV SOLN: INTRAVENOUS | Status: DC

## 2021-04-24 MED ORDER — ONDANSETRON HCL 4 MG/2ML IJ SOLN
4.0000 mg | Freq: Once | INTRAMUSCULAR | Status: DC | PRN
Start: 2021-04-24 — End: 2021-04-24

## 2021-04-24 MED ORDER — FLEET ENEMA 7-19 GM/118ML RE ENEM
1.0000 | ENEMA | Freq: Once | RECTAL | Status: DC | PRN
  Filled 2021-04-24: qty 1

## 2021-04-24 MED ORDER — CLINDAMYCIN PHOSPHATE 900 MG/50ML IV SOLN
INTRAVENOUS | Status: AC
  Filled 2021-04-24: qty 50

## 2021-04-24 MED ORDER — PHENYLEPHRINE HCL-NACL 10-0.9 MG/250ML-% IV SOLN
INTRAVENOUS | Status: DC | PRN
  Administered 2021-04-24: 50 ug/min via INTRAVENOUS

## 2021-04-24 MED ORDER — SUCCINYLCHOLINE CHLORIDE 20 MG/ML IJ SOLN
INTRAMUSCULAR | Status: DC | PRN
  Administered 2021-04-24: 120 mg via INTRAVENOUS

## 2021-04-24 MED ORDER — PROPOFOL 10 MG/ML IV BOLUS
INTRAVENOUS | Status: DC | PRN
  Administered 2021-04-24: 200 mg via INTRAVENOUS

## 2021-04-24 MED ORDER — MIDAZOLAM HCL 2 MG/2ML IJ SOLN
2.0000 mg | Freq: Once | INTRAMUSCULAR | Status: AC
  Administered 2021-04-24: 2 mg via INTRAVENOUS

## 2021-04-24 MED ORDER — SENNOSIDES-DOCUSATE SODIUM 8.6-50 MG PO TABS: 1.0000 | ORAL_TABLET | Freq: Every evening | ORAL | Status: DC | PRN

## 2021-04-24 MED ORDER — BUPIVACAINE HCL (PF) 0.5 % IJ SOLN
INTRAMUSCULAR | Status: DC | PRN
  Administered 2021-04-24: 15 mL via PERINEURAL

## 2021-04-24 MED ORDER — METOCLOPRAMIDE HCL 5 MG PO TABS
5.0000 mg | ORAL_TABLET | Freq: Three times a day (TID) | ORAL | Status: DC | PRN
Start: 2021-04-24 — End: 2021-04-25

## 2021-04-24 MED ORDER — CLINDAMYCIN PHOSPHATE 900 MG/50ML IV SOLN
900.0000 mg | INTRAVENOUS | Status: AC
  Administered 2021-04-24: 900 mg via INTRAVENOUS

## 2021-04-24 MED ORDER — EZETIMIBE 10 MG PO TABS
10.0000 mg | ORAL_TABLET | Freq: Every day | ORAL | Status: DC
  Filled 2021-04-24: qty 1

## 2021-04-24 MED ORDER — HYDROMORPHONE HCL 1 MG/ML IJ SOLN
0.5000 mg | INTRAMUSCULAR | Status: DC | PRN
Start: 2021-04-24 — End: 2021-04-25

## 2021-04-24 MED ORDER — FENTANYL CITRATE (PF) 100 MCG/2ML IJ SOLN
INTRAMUSCULAR | Status: DC | PRN
  Administered 2021-04-24 (×2): 50 ug via INTRAVENOUS

## 2021-04-24 MED ORDER — LIDOCAINE HCL (CARDIAC) PF 100 MG/5ML IV SOSY
PREFILLED_SYRINGE | INTRAVENOUS | Status: DC | PRN
  Administered 2021-04-24: 20 mg via INTRAVENOUS

## 2021-04-24 MED ORDER — DEXAMETHASONE SODIUM PHOSPHATE 10 MG/ML IJ SOLN
INTRAMUSCULAR | Status: AC
  Filled 2021-04-24: qty 1

## 2021-04-24 MED ORDER — OXYCODONE HCL 5 MG/5ML PO SOLN: 5.0000 mg | Freq: Once | ORAL | Status: DC | PRN

## 2021-04-24 MED ORDER — CLINDAMYCIN PHOSPHATE 600 MG/50ML IV SOLN
600.0000 mg | Freq: Four times a day (QID) | INTRAVENOUS | Status: AC
  Administered 2021-04-24 – 2021-04-25 (×3): 600 mg via INTRAVENOUS

## 2021-04-24 MED ORDER — PHENYLEPHRINE HCL (PRESSORS) 10 MG/ML IV SOLN
INTRAVENOUS | Status: AC
  Filled 2021-04-24: qty 1

## 2021-04-24 MED ORDER — HYDROMORPHONE HCL 1 MG/ML IJ SOLN
0.2500 mg | INTRAMUSCULAR | Status: DC | PRN
  Administered 2021-04-24 (×2): 0.25 mg via INTRAVENOUS

## 2021-04-24 MED ORDER — PANTOPRAZOLE SODIUM 20 MG PO TBEC
20.0000 mg | DELAYED_RELEASE_TABLET | Freq: Every day | ORAL | Status: DC
  Filled 2021-04-24: qty 1

## 2021-04-24 MED ORDER — LIDOCAINE 2% (20 MG/ML) 5 ML SYRINGE
INTRAMUSCULAR | Status: AC
  Filled 2021-04-24: qty 5

## 2021-04-24 MED ORDER — ONDANSETRON HCL 4 MG/2ML IJ SOLN
INTRAMUSCULAR | Status: AC
  Filled 2021-04-24: qty 2

## 2021-04-24 MED ORDER — OXYCODONE HCL 5 MG PO TABS
5.0000 mg | ORAL_TABLET | ORAL | Status: DC | PRN
  Administered 2021-04-24 (×2): 5 mg via ORAL
  Administered 2021-04-24 – 2021-04-25 (×2): 10 mg via ORAL
  Filled 2021-04-24 (×2): qty 2
  Filled 2021-04-24 (×2): qty 1

## 2021-04-24 MED ORDER — ACETAMINOPHEN 500 MG PO TABS
1000.0000 mg | ORAL_TABLET | Freq: Once | ORAL | Status: AC
  Administered 2021-04-24: 1000 mg via ORAL

## 2021-04-24 MED ORDER — FENTANYL CITRATE (PF) 100 MCG/2ML IJ SOLN
INTRAMUSCULAR | Status: AC
  Filled 2021-04-24: qty 2

## 2021-04-24 MED ORDER — SORBITOL 70 % SOLN: 30.0000 mL | Freq: Every day | Status: DC | PRN

## 2021-04-24 MED ORDER — TIZANIDINE HCL 4 MG PO TABS: 4.0000 mg | ORAL_TABLET | Freq: Four times a day (QID) | ORAL | 0 refills | Status: DC | PRN

## 2021-04-24 MED ORDER — ROCURONIUM BROMIDE 100 MG/10ML IV SOLN
INTRAVENOUS | Status: DC | PRN
  Administered 2021-04-24: 50 mg via INTRAVENOUS

## 2021-04-24 MED ORDER — CELECOXIB 200 MG PO CAPS: 200.0000 mg | ORAL_CAPSULE | Freq: Every day | ORAL | Status: DC

## 2021-04-24 MED ORDER — MIDAZOLAM HCL 2 MG/2ML IJ SOLN
INTRAMUSCULAR | Status: AC
  Filled 2021-04-24: qty 2

## 2021-04-24 MED ORDER — ACETAMINOPHEN 500 MG PO TABS
1000.0000 mg | ORAL_TABLET | Freq: Four times a day (QID) | ORAL | Status: DC
  Administered 2021-04-24 – 2021-04-25 (×3): 1000 mg via ORAL
  Filled 2021-04-24 (×3): qty 2

## 2021-04-24 MED ORDER — ROCURONIUM BROMIDE 10 MG/ML (PF) SYRINGE
PREFILLED_SYRINGE | INTRAVENOUS | Status: AC
  Filled 2021-04-24: qty 10

## 2021-04-24 MED ORDER — ACETAMINOPHEN 325 MG PO TABS: 325.0000 mg | ORAL_TABLET | Freq: Four times a day (QID) | ORAL | Status: DC | PRN

## 2021-04-24 SURGICAL SUPPLY — 78 items
AID PSTN UNV HD RSTRNT DISP (MISCELLANEOUS) ×1
ANCH SUT 2 19.1 W/FIBERTAPE (Anchor) ×2 IMPLANT
ANCHOR SL BIO 4.75 W/FIBERTAPE (Anchor) ×2 IMPLANT
APL SKNCLS STERI-STRIP NONHPOA (GAUZE/BANDAGES/DRESSINGS)
BENZOIN TINCTURE PRP APPL 2/3 (GAUZE/BANDAGES/DRESSINGS) IMPLANT
BLADE AVERAGE 25X9 (BLADE) ×1 IMPLANT
BLADE SURG 15 STRL LF DISP TIS (BLADE) IMPLANT
BLADE SURG 15 STRL SS (BLADE) ×2
BUR EGG 3PK/BX (BURR) ×1 IMPLANT
BURR OVAL 8 FLU 4.0X13 (MISCELLANEOUS) IMPLANT
CANNULA 5.75X71 LONG (CANNULA) IMPLANT
CANNULA SHOULDER 7CM (CANNULA) ×2 IMPLANT
CANNULA TWIST IN 8.25X7CM (CANNULA) IMPLANT
CLEANER CAUTERY TIP 5X5 PAD (MISCELLANEOUS) IMPLANT
COVER WAND RF STERILE (DRAPES) IMPLANT
DECANTER SPIKE VIAL GLASS SM (MISCELLANEOUS) IMPLANT
DISSECTOR  3.8MM X 13CM (MISCELLANEOUS) ×2
DISSECTOR 3.8MM X 13CM (MISCELLANEOUS) IMPLANT
DISSECTOR 4.0MM X 13CM (MISCELLANEOUS) IMPLANT
DRAPE STERI 35X30 U-POUCH (DRAPES) ×2 IMPLANT
DRAPE SURG 17X23 STRL (DRAPES) ×2 IMPLANT
DRAPE U-SHAPE 76X120 STRL (DRAPES) ×4 IMPLANT
DRESSING MEPILEX FLEX 4X4 (GAUZE/BANDAGES/DRESSINGS) IMPLANT
DRSG EMULSION OIL 3X3 NADH (GAUZE/BANDAGES/DRESSINGS) ×2 IMPLANT
DRSG MEPILEX BORDER 4X8 (GAUZE/BANDAGES/DRESSINGS) IMPLANT
DRSG MEPILEX FLEX 4X4 (GAUZE/BANDAGES/DRESSINGS)
DRSG PAD ABDOMINAL 8X10 ST (GAUZE/BANDAGES/DRESSINGS) ×2 IMPLANT
DURAPREP 26ML APPLICATOR (WOUND CARE) ×2 IMPLANT
ELECT REM PT RETURN 9FT ADLT (ELECTROSURGICAL) ×2
ELECTRODE REM PT RTRN 9FT ADLT (ELECTROSURGICAL) ×1 IMPLANT
GAUZE SPONGE 4X4 12PLY STRL (GAUZE/BANDAGES/DRESSINGS) ×2 IMPLANT
GLOVE SRG 8 PF TXTR STRL LF DI (GLOVE) ×2 IMPLANT
GLOVE SURG ENC MOIS LTX SZ7.5 (GLOVE) ×4 IMPLANT
GLOVE SURG ORTHO LTX SZ8 (GLOVE) ×4 IMPLANT
GLOVE SURG UNDER POLY LF SZ8 (GLOVE) ×4
GOWN STRL REUS W/ TWL LRG LVL3 (GOWN DISPOSABLE) ×1 IMPLANT
GOWN STRL REUS W/ TWL XL LVL3 (GOWN DISPOSABLE) ×1 IMPLANT
GOWN STRL REUS W/TWL LRG LVL3 (GOWN DISPOSABLE)
GOWN STRL REUS W/TWL XL LVL3 (GOWN DISPOSABLE) ×8 IMPLANT
MANIFOLD NEPTUNE II (INSTRUMENTS) ×2 IMPLANT
NDL 1/2 CIR CATGUT .05X1.09 (NEEDLE) IMPLANT
NDL SCORPION MULTI FIRE (NEEDLE) IMPLANT
NEEDLE 1/2 CIR CATGUT .05X1.09 (NEEDLE) ×2 IMPLANT
NEEDLE SCORPION MULTI FIRE (NEEDLE) ×2 IMPLANT
NS IRRIG 1000ML POUR BTL (IV SOLUTION) ×2 IMPLANT
PACK ARTHROSCOPY DSU (CUSTOM PROCEDURE TRAY) ×2 IMPLANT
PACK BASIN DAY SURGERY FS (CUSTOM PROCEDURE TRAY) ×2 IMPLANT
PAD CLEANER CAUTERY TIP 5X5 (MISCELLANEOUS) ×1
PAD ORTHO SHOULDER 7X19 LRG (SOFTGOODS) ×1 IMPLANT
PENCIL SMOKE EVACUATOR (MISCELLANEOUS) IMPLANT
PORT APPOLLO RF 90DEGREE MULTI (SURGICAL WAND) IMPLANT
RESTRAINT HEAD UNIVERSAL NS (MISCELLANEOUS) ×2 IMPLANT
SLEEVE SCD COMPRESS KNEE MED (STOCKING) ×2 IMPLANT
SLING ARM FOAM STRAP LRG (SOFTGOODS) IMPLANT
SLING ULTRA II MEDIUM (SOFTGOODS) IMPLANT
SLING ULTRA II SMALL (SOFTGOODS) IMPLANT
SPONGE LAP 4X18 RFD (DISPOSABLE) ×1 IMPLANT
STRIP CLOSURE SKIN 1/2X4 (GAUZE/BANDAGES/DRESSINGS) IMPLANT
SUCTION FRAZIER HANDLE 10FR (MISCELLANEOUS)
SUCTION TUBE FRAZIER 10FR DISP (MISCELLANEOUS) IMPLANT
SUT BONE WAX W31G (SUTURE) ×1 IMPLANT
SUT ETHILON 3 0 PS 1 (SUTURE) ×2 IMPLANT
SUT FIBERWIRE #2 38 T-5 BLUE (SUTURE)
SUT MNCRL AB 3-0 PS2 18 (SUTURE) ×1 IMPLANT
SUT TICRON 1 T 12 (SUTURE) ×1 IMPLANT
SUT TIGER TAPE 7 IN WHITE (SUTURE) ×1 IMPLANT
SUT VIC AB 0 CT1 27 (SUTURE)
SUT VIC AB 0 CT1 27XBRD ANBCTR (SUTURE) IMPLANT
SUT VIC AB 1 CT1 27 (SUTURE)
SUT VIC AB 1 CT1 27XBRD ANBCTR (SUTURE) IMPLANT
SUT VIC AB 2-0 SH 27 (SUTURE) ×2
SUT VIC AB 2-0 SH 27XBRD (SUTURE) IMPLANT
SUTURE FIBERWR #2 38 T-5 BLUE (SUTURE) IMPLANT
TAPE FIBER 2MM 7IN #2 BLUE (SUTURE) IMPLANT
TOWEL GREEN STERILE FF (TOWEL DISPOSABLE) ×2 IMPLANT
TUBING ARTHROSCOPY IRRIG 16FT (MISCELLANEOUS) ×2 IMPLANT
WATER STERILE IRR 1000ML POUR (IV SOLUTION) ×2 IMPLANT
YANKAUER SUCT BULB TIP NO VENT (SUCTIONS) ×1 IMPLANT

## 2021-04-24 NOTE — Brief Op Note (Signed)
04/24/2021  10:22 AM  PATIENT:  Nathan Mcclure  70 y.o. male  PRE-OPERATIVE DIAGNOSIS:  LEFT SHOULDER ROTATOR CUFF TEAR  POST-OPERATIVE DIAGNOSIS:  LEFT SHOULDER ROTATOR CUFF TEAR  PROCEDURE:  Procedure(s): SHOULDER ARTHROSCOPY WITH  SUBACROMIAL DECOMPRESSION (Left) OPEN ROTATOR CUFF REPAIR SHOULDER DISTAL CLAVICLE RESECTION (Left)  SURGEON:  Surgeon(s) and Role:    Earlie Server, MD - Primary  PHYSICIAN ASSISTANT: Chriss Czar, PA-C  ASSISTANTS:    ANESTHESIA:   regional and general  EBL:  75 mL   BLOOD ADMINISTERED:none  DRAINS: none   LOCAL MEDICATIONS USED:  NONE  SPECIMEN:  No Specimen  DISPOSITION OF SPECIMEN:  N/A  COUNTS:  YES  TOURNIQUET:  * No tourniquets in log *  DICTATION: .Other Dictation: Dictation Number unknown  PLAN OF CARE: Admit for overnight observation  PATIENT DISPOSITION:  PACU - hemodynamically stable.   Delay start of Pharmacological VTE agent (>24hrs) due to surgical blood loss or risk of bleeding: yes

## 2021-04-24 NOTE — Progress Notes (Signed)
Assisted Dr. Doroteo Glassman with left, ultrasound guided, interscalene  block. Side rails up, monitors on throughout procedure. See vital signs in flow sheet. Tolerated Procedure well.

## 2021-04-24 NOTE — Anesthesia Procedure Notes (Signed)
Anesthesia Regional Block: Interscalene brachial plexus block   Pre-Anesthetic Checklist: ,, timeout performed, Correct Patient, Correct Site, Correct Laterality, Correct Procedure, Correct Position, site marked, Risks and benefits discussed,  Surgical consent,  Pre-op evaluation,  At surgeon's request and post-op pain management  Laterality: Left  Prep: Maximum Sterile Barrier Precautions used, chloraprep       Needles:  Injection technique: Single-shot  Needle Type: Echogenic Stimulator Needle     Needle Length: 9cm  Needle Gauge: 22     Additional Needles:   Procedures:,,,, ultrasound used (permanent image in chart),,,,  Narrative:  Start time: 04/24/2021 7:00 AM End time: 04/24/2021 7:05 AM Injection made incrementally with aspirations every 5 mL.  Performed by: Personally  Anesthesiologist: Pervis Hocking, DO  Additional Notes: Monitors applied. No increased pain on injection. No increased resistance to injection. Injection made in 5cc increments. Good needle visualization. Patient tolerated procedure well.

## 2021-04-24 NOTE — Transfer of Care (Signed)
Immediate Anesthesia Transfer of Care Note  Patient: Nathan Mcclure  Procedure(s) Performed: SHOULDER ARTHROSCOPY WITH  SUBACROMIAL DECOMPRESSION (Left Shoulder) OPEN ROTATOR CUFF REPAIR SHOULDER DISTAL CLAVICLE RESECTION (Left Shoulder)  Patient Location: PACU  Anesthesia Type:GA combined with regional for post-op pain  Level of Consciousness: awake, alert  and oriented  Airway & Oxygen Therapy: Patient Spontanous Breathing and Patient connected to face mask oxygen  Post-op Assessment: Report given to RN and Post -op Vital signs reviewed and stable  Post vital signs: Reviewed and stable  Last Vitals:  Vitals Value Taken Time  BP 144/78 04/24/21 1018  Temp    Pulse 71 04/24/21 1021  Resp 10 04/24/21 1021  SpO2 99 % 04/24/21 1021  Vitals shown include unvalidated device data.  Last Pain:  Vitals:   04/24/21 0656  TempSrc: Oral  PainSc: 4       Patients Stated Pain Goal: 5 (76/54/65 0354)  Complications: No complications documented.

## 2021-04-24 NOTE — Anesthesia Postprocedure Evaluation (Signed)
Anesthesia Post Note  Patient: Nathan Mcclure  Procedure(s) Performed: SHOULDER ARTHROSCOPY WITH  SUBACROMIAL DECOMPRESSION (Left Shoulder) OPEN ROTATOR CUFF REPAIR SHOULDER DISTAL CLAVICLE RESECTION (Left Shoulder)     Patient location during evaluation: PACU Anesthesia Type: Regional and General Level of consciousness: awake and alert, oriented and patient cooperative Pain management: pain level controlled Vital Signs Assessment: post-procedure vital signs reviewed and stable Respiratory status: spontaneous breathing, nonlabored ventilation and respiratory function stable Cardiovascular status: blood pressure returned to baseline and stable Postop Assessment: no apparent nausea or vomiting Anesthetic complications: no   No complications documented.  Last Vitals:  Vitals:   04/24/21 1115 04/24/21 1130  BP: (!) 142/78 136/78  Pulse: 70 70  Resp: 18 15  Temp:    SpO2: 95% 96%    Last Pain:  Vitals:   04/24/21 1110  TempSrc:   PainSc: [place]

## 2021-04-24 NOTE — Discharge Instructions (Signed)
Diet: As you were doing prior to hospitalization   Activity: Increase activity slowly as tolerated  No lifting or driving for 6 weeks   Shower: May shower without a dressing on post op day #3, NO SOAKING in tub   Dressing: You may change your dressing on post op day #3.  Then change the dressing daily with sterile 4"x4"s gauze dressing   Weight Bearing: nonweight bearing operative arm, remain in sling except pendulum exercises and showering (keep arm close to body)  To prevent constipation: you may use a stool softener such as -  Colace ( over the counter) 100 mg by mouth twice a day  Drink plenty of fluids ( prune juice may be helpful) and high fiber foods  Miralax ( over the counter) for constipation as needed.   Precautions: If you experience chest pain or shortness of breath - call 911 immediately For transfer to the hospital emergency department!!  If you develop a fever greater that 101 F, purulent drainage from wound, increased redness or drainage from wound, or calf pain -- Call the office   Follow- Up Appointment: Please call for an appointment to be seen in 1 week or as previously scheduled  [place] - [phone]  Regional Anesthesia Blocks  1. Numbness or the inability to move the "blocked" extremity may last from 3-48 hours after placement. The length of time depends on the medication injected and your individual response to the medication. If the numbness is not going away after 48 hours, call your surgeon.  2. The extremity that is blocked will need to be protected until the numbness is gone and the  Strength has returned. Because you cannot feel it, you will need to take extra care to avoid injury. Because it may be weak, you may have difficulty moving it or using it. You may not know what position it is in without looking at it while the block is in effect.  3. For blocks in the legs and feet, returning to weight bearing and walking needs to be done carefully. You  will need to wait until the numbness is entirely gone and the strength has returned. You should be able to move your leg and foot normally before you try and bear weight or walk. You will need someone to be with you when you first try to ensure you do not fall and possibly risk injury.  4. Bruising and tenderness at the needle site are common side effects and will resolve in a few days.  5. Persistent numbness or new problems with movement should be communicated to the surgeon or the [place] [phone] [place] [phone]).  Information for Discharge Teaching: EXPAREL (bupivacaine liposome injectable suspension)   Your surgeon or anesthesiologist gave you EXPAREL(bupivacaine) to help control your pain after surgery.   EXPAREL is a local anesthetic that provides pain relief by numbing the tissue around the surgical site.  EXPAREL is designed to release pain medication over time and can control pain for up to 72 hours.  Depending on how you respond to EXPAREL, you may require less pain medication during your recovery.  Possible side effects:  Temporary loss of sensation or ability to move in the area where bupivacaine was injected.  Nausea, vomiting, constipation  Rarely, numbness and tingling in your mouth or lips, lightheadedness, or anxiety may occur.  Call your doctor right away if you think you may be experiencing any of these sensations, or if you have other  questions regarding possible side effects.  Follow all other discharge instructions given to you by your surgeon or nurse. Eat a healthy diet and drink plenty of water or other fluids.  If you return to the hospital for any reason within 96 hours following the administration of EXPAREL, it is important for health care providers to know that you have received this anesthetic. A teal colored band has been placed on your arm with the date, time and amount of EXPAREL you have received in order  to alert and inform your health care providers. Please leave this armband in place for the full 96 hours following administration, and then you may remove the band.

## 2021-04-24 NOTE — Interval H&P Note (Signed)
History and Physical Interval Note:  04/24/2021 8:34 AM  Nathan Mcclure  has presented today for surgery, with the diagnosis of LEFT SHOULDER [name].  The various methods of treatment have been discussed with the patient and family. After consideration of risks, benefits and other options for treatment, the patient has consented to  Procedure(s): SHOULDER ARTHROSCOPY WITH ROTATOR CUFF REPAIR AND SUBACROMIAL DECOMPRESSION (Left) as a surgical intervention.  The patient's history has been reviewed, patient examined, no change in status, stable for surgery.  I have reviewed the patient's chart and labs.  Questions were answered to the patient's satisfaction.     Yvette Rack

## 2021-04-24 NOTE — Anesthesia Procedure Notes (Signed)
Procedure Name: Intubation Date/Time: 04/24/2021 8:43 AM Performed by: [place] Christyl Osentoski C, CRNA Pre-anesthesia Checklist: Patient identified, Emergency Drugs available, Suction available and Patient being monitored Patient Re-evaluated:Patient Re-evaluated prior to induction Oxygen Delivery Method: Circle system utilized Preoxygenation: Pre-oxygenation with 100% oxygen Induction Type: IV induction Ventilation: Mask ventilation without difficulty Laryngoscope Size: Glidescope, Mac and 3 Grade View: Grade I Tube type: Oral Laser Tube: Cuffed inflated with minimal occlusive pressure - saline Number of attempts: 1 Airway Equipment and Method: Oral airway,  Video-laryngoscopy and Rigid stylet Placement Confirmation: ETT inserted through vocal cords under direct vision,  positive ETCO2 and breath sounds checked- equal and bilateral Secured at: 23 cm Tube secured with: Tape Dental Injury: Teeth and Oropharynx as per pre-operative assessment  Difficulty Due To: Difficulty was anticipated and Difficult Airway- due to reduced neck mobility

## 2021-04-25 ENCOUNTER — Encounter (HOSPITAL_BASED_OUTPATIENT_CLINIC_OR_DEPARTMENT_OTHER): Payer: Self-pay | Admitting: Orthopedic Surgery

## 2021-04-25 DIAGNOSIS — Z87891 Personal history of nicotine dependence: Secondary | ICD-10-CM | POA: Diagnosis not present

## 2021-04-25 DIAGNOSIS — M7542 Impingement syndrome of left shoulder: Secondary | ICD-10-CM | POA: Diagnosis not present

## 2021-04-25 DIAGNOSIS — Z79899 Other long term (current) drug therapy: Secondary | ICD-10-CM | POA: Diagnosis not present

## 2021-04-25 DIAGNOSIS — S46112A Strain of muscle, fascia and tendon of long head of biceps, left arm, initial encounter: Secondary | ICD-10-CM | POA: Diagnosis not present

## 2021-04-25 DIAGNOSIS — Z8546 Personal history of malignant neoplasm of prostate: Secondary | ICD-10-CM | POA: Diagnosis not present

## 2021-04-25 DIAGNOSIS — M19012 Primary osteoarthritis, left shoulder: Secondary | ICD-10-CM | POA: Diagnosis not present

## 2021-04-25 DIAGNOSIS — G8929 Other chronic pain: Secondary | ICD-10-CM | POA: Diagnosis not present

## 2021-04-25 DIAGNOSIS — M75122 Complete rotator cuff tear or rupture of left shoulder, not specified as traumatic: Secondary | ICD-10-CM | POA: Diagnosis not present

## 2021-04-25 DIAGNOSIS — S43492A Other sprain of left shoulder joint, initial encounter: Secondary | ICD-10-CM | POA: Diagnosis not present

## 2021-04-25 DIAGNOSIS — X58XXXA Exposure to other specified factors, initial encounter: Secondary | ICD-10-CM | POA: Diagnosis not present

## 2021-04-25 DIAGNOSIS — Z9889 Other specified postprocedural states: Secondary | ICD-10-CM | POA: Diagnosis not present

## 2021-04-25 MED ORDER — CLINDAMYCIN PHOSPHATE 600 MG/50ML IV SOLN
INTRAVENOUS | Status: AC
  Filled 2021-04-25: qty 50

## 2021-04-25 NOTE — Op Note (Signed)
NAME: Nathan Mcclure, Nathan Mcclure MEDICAL RECORD NO: [id number] ACCOUNT NO: [id number] DATE OF BIRTH: 1951-11-27 FACILITY: [facility] LOCATION: [facility] PHYSICIAN: W D. Valeta Harms., MD  Operative Report   DATE OF PROCEDURE: 04/24/2021   PREOPERATIVE DIAGNOSIS:   1.  Complete supraspinatus tendon tear. 2.  Subacromial impingement. 3.  Acromioclavicular arthritis. 4.  Degenerative tearing anterior, superior, inferior labrum. 5.  Partial biceps tendon tear.   POSTOPERATIVE DIAGNOSIS:   1.  Complete supraspinatus tendon tear. 2.  Subacromial impingement. 3.  Acromioclavicular arthritis. 4.  Degenerative tearing anterior, superior, inferior labrum. 5.  Partial biceps tendon tear.  OPERATIONS:   1.  Open rotator cuff repair, acromioplasty (chronic). 2.  Open distal clavicle excision. 3.  Arthroscopic debridement of labrum and biceps tendon.  SURGEON:  W D. Valeta Harms., MD.    Terrence DupontMarjo Bicker.    BLOOD LOSS:  Minimal.   General with a nerve block.  DESCRIPTION OF PROCEDURE:  Statham chair position, posterior and anterior portals were created.  Complete tear of the supraspinatus was noted intraarticularly, significant tearing anterior, superior and posterior, anterior labrum was debrided, but a 15-20%  biceps tendon lesion was noted a partial tear debrided.  Intraarticular debridement was noted of the structures as well as the undersurface of the cuff tear.  We then converted the surgery to an open procedure with an incision, bisecting the acromial AC  interval.  Distal clavicle was extremely arthritic.  We excised about 1 cm of the clavicle, followed by performing an open acromioplasty, resecting about 5-6 mm of bone.  Deltoid was split, 2-3 cm distal to the tip of the acromion.  Bursectomy carried  out revealing the cuff tear, edges of the cuff were freshened with a 15 blade.  The tuberosity burred and mobilization was carried out of the cuff, which was only slightly retracted.  We elected  to fix this with the medial, lateral row technique with an Arthrex SwiveLock on the medial side through the tuberosity.  Four strands of FiberTape were passed through the cuff with an additional medial stitch as well.  We then passed this on  the lateral row technique with a SwiveLock inserted into the tuberosity laterally using an additional stitch for a total of 4 FiberTapes and 2 ancillary stitches to create essentially a watertight repair.  Wound was irrigated.  Closure of the deltoid  was affected with #1 Ti-Cron, 2-0 Vicryl and Monocryl in the skin.  Placed in a Mili pillow splint, taken to recovery room in stable condition.     Elián.Darby D: 04/24/2021 10:05:57 am T: 04/25/2021 2:28:00 am  JOB: [id number]/ [id number]

## 2021-04-25 NOTE — Addendum Note (Signed)
Addendum  created 04/25/21 1237 by Mohamad Bruso, Ernesta Amble, CRNA   Charge Capture section accepted

## 2021-04-30 ENCOUNTER — Other Ambulatory Visit: Payer: Self-pay

## 2021-04-30 MED ORDER — LOSARTAN POTASSIUM 50 MG PO TABS: 1.0000 | ORAL_TABLET | Freq: Two times a day (BID) | ORAL | 1 refills | Status: DC

## 2021-05-02 DIAGNOSIS — M25512 Pain in left shoulder: Secondary | ICD-10-CM | POA: Diagnosis not present

## 2021-05-23 DIAGNOSIS — M25522 Pain in left elbow: Secondary | ICD-10-CM | POA: Diagnosis not present

## 2021-05-27 DIAGNOSIS — M25612 Stiffness of left shoulder, not elsewhere classified: Secondary | ICD-10-CM | POA: Diagnosis not present

## 2021-05-27 DIAGNOSIS — M6281 Muscle weakness (generalized): Secondary | ICD-10-CM | POA: Diagnosis not present

## 2021-05-27 DIAGNOSIS — M25561 Pain in right knee: Secondary | ICD-10-CM | POA: Diagnosis not present

## 2021-05-27 DIAGNOSIS — M75102 Unspecified rotator cuff tear or rupture of left shoulder, not specified as traumatic: Secondary | ICD-10-CM | POA: Diagnosis not present

## 2021-05-27 DIAGNOSIS — M25512 Pain in left shoulder: Secondary | ICD-10-CM | POA: Diagnosis not present

## 2021-05-31 DIAGNOSIS — M25512 Pain in left shoulder: Secondary | ICD-10-CM | POA: Diagnosis not present

## 2021-05-31 DIAGNOSIS — M25612 Stiffness of left shoulder, not elsewhere classified: Secondary | ICD-10-CM | POA: Diagnosis not present

## 2021-05-31 DIAGNOSIS — M75102 Unspecified rotator cuff tear or rupture of left shoulder, not specified as traumatic: Secondary | ICD-10-CM | POA: Diagnosis not present

## 2021-05-31 DIAGNOSIS — M6281 Muscle weakness (generalized): Secondary | ICD-10-CM | POA: Diagnosis not present

## 2021-06-04 DIAGNOSIS — M25512 Pain in left shoulder: Secondary | ICD-10-CM | POA: Diagnosis not present

## 2021-06-04 DIAGNOSIS — M059 Rheumatoid arthritis with rheumatoid factor, unspecified: Secondary | ICD-10-CM | POA: Diagnosis not present

## 2021-06-04 DIAGNOSIS — M150 Primary generalized (osteo)arthritis: Secondary | ICD-10-CM | POA: Diagnosis not present

## 2021-06-04 DIAGNOSIS — M25612 Stiffness of left shoulder, not elsewhere classified: Secondary | ICD-10-CM | POA: Diagnosis not present

## 2021-06-04 DIAGNOSIS — M6281 Muscle weakness (generalized): Secondary | ICD-10-CM | POA: Diagnosis not present

## 2021-06-04 DIAGNOSIS — Z79899 Other long term (current) drug therapy: Secondary | ICD-10-CM | POA: Diagnosis not present

## 2021-06-04 DIAGNOSIS — M2550 Pain in unspecified joint: Secondary | ICD-10-CM | POA: Diagnosis not present

## 2021-06-04 DIAGNOSIS — M75102 Unspecified rotator cuff tear or rupture of left shoulder, not specified as traumatic: Secondary | ICD-10-CM | POA: Diagnosis not present

## 2021-06-06 DIAGNOSIS — M25612 Stiffness of left shoulder, not elsewhere classified: Secondary | ICD-10-CM | POA: Diagnosis not present

## 2021-06-06 DIAGNOSIS — M25512 Pain in left shoulder: Secondary | ICD-10-CM | POA: Diagnosis not present

## 2021-06-06 DIAGNOSIS — M75102 Unspecified rotator cuff tear or rupture of left shoulder, not specified as traumatic: Secondary | ICD-10-CM | POA: Diagnosis not present

## 2021-06-06 DIAGNOSIS — M6281 Muscle weakness (generalized): Secondary | ICD-10-CM | POA: Diagnosis not present

## 2021-06-08 ENCOUNTER — Other Ambulatory Visit: Payer: Self-pay | Admitting: Cardiovascular Disease

## 2021-06-10 NOTE — Telephone Encounter (Signed)
Prescription refill request for Xarelto received.  Indication:atrial fib Last office visit:12/21 Weight:98.2 kg Age:70 Scr:0.9 CrCl:107.6 ml/min  Prescription refilled

## 2021-06-11 DIAGNOSIS — M25612 Stiffness of left shoulder, not elsewhere classified: Secondary | ICD-10-CM | POA: Diagnosis not present

## 2021-06-11 DIAGNOSIS — M6281 Muscle weakness (generalized): Secondary | ICD-10-CM | POA: Diagnosis not present

## 2021-06-11 DIAGNOSIS — M25512 Pain in left shoulder: Secondary | ICD-10-CM | POA: Diagnosis not present

## 2021-06-11 DIAGNOSIS — M75102 Unspecified rotator cuff tear or rupture of left shoulder, not specified as traumatic: Secondary | ICD-10-CM | POA: Diagnosis not present

## 2021-06-17 DIAGNOSIS — M25612 Stiffness of left shoulder, not elsewhere classified: Secondary | ICD-10-CM | POA: Diagnosis not present

## 2021-06-17 DIAGNOSIS — M6281 Muscle weakness (generalized): Secondary | ICD-10-CM | POA: Diagnosis not present

## 2021-06-17 DIAGNOSIS — M75102 Unspecified rotator cuff tear or rupture of left shoulder, not specified as traumatic: Secondary | ICD-10-CM | POA: Diagnosis not present

## 2021-06-17 DIAGNOSIS — M25512 Pain in left shoulder: Secondary | ICD-10-CM | POA: Diagnosis not present

## 2021-06-21 DIAGNOSIS — M25512 Pain in left shoulder: Secondary | ICD-10-CM | POA: Diagnosis not present

## 2021-06-21 DIAGNOSIS — M75102 Unspecified rotator cuff tear or rupture of left shoulder, not specified as traumatic: Secondary | ICD-10-CM | POA: Diagnosis not present

## 2021-06-21 DIAGNOSIS — M25612 Stiffness of left shoulder, not elsewhere classified: Secondary | ICD-10-CM | POA: Diagnosis not present

## 2021-06-21 DIAGNOSIS — M6281 Muscle weakness (generalized): Secondary | ICD-10-CM | POA: Diagnosis not present

## 2021-06-28 ENCOUNTER — Other Ambulatory Visit: Payer: Self-pay | Admitting: Orthopedic Surgery

## 2021-06-28 DIAGNOSIS — M25522 Pain in left elbow: Secondary | ICD-10-CM | POA: Diagnosis not present

## 2021-06-28 DIAGNOSIS — M25512 Pain in left shoulder: Secondary | ICD-10-CM

## 2021-07-02 DIAGNOSIS — M75102 Unspecified rotator cuff tear or rupture of left shoulder, not specified as traumatic: Secondary | ICD-10-CM | POA: Diagnosis not present

## 2021-07-02 DIAGNOSIS — M25512 Pain in left shoulder: Secondary | ICD-10-CM | POA: Diagnosis not present

## 2021-07-02 DIAGNOSIS — M25612 Stiffness of left shoulder, not elsewhere classified: Secondary | ICD-10-CM | POA: Diagnosis not present

## 2021-07-02 DIAGNOSIS — M6281 Muscle weakness (generalized): Secondary | ICD-10-CM | POA: Diagnosis not present

## 2021-07-03 DIAGNOSIS — R3121 Asymptomatic microscopic hematuria: Secondary | ICD-10-CM | POA: Diagnosis not present

## 2021-07-03 DIAGNOSIS — R3914 Feeling of incomplete bladder emptying: Secondary | ICD-10-CM | POA: Diagnosis not present

## 2021-07-03 DIAGNOSIS — N401 Enlarged prostate with lower urinary tract symptoms: Secondary | ICD-10-CM | POA: Diagnosis not present

## 2021-07-03 DIAGNOSIS — R3912 Poor urinary stream: Secondary | ICD-10-CM | POA: Diagnosis not present

## 2021-07-04 DIAGNOSIS — M75102 Unspecified rotator cuff tear or rupture of left shoulder, not specified as traumatic: Secondary | ICD-10-CM | POA: Diagnosis not present

## 2021-07-04 DIAGNOSIS — M25612 Stiffness of left shoulder, not elsewhere classified: Secondary | ICD-10-CM | POA: Diagnosis not present

## 2021-07-04 DIAGNOSIS — M6281 Muscle weakness (generalized): Secondary | ICD-10-CM | POA: Diagnosis not present

## 2021-07-04 DIAGNOSIS — M25512 Pain in left shoulder: Secondary | ICD-10-CM | POA: Diagnosis not present

## 2021-07-08 DIAGNOSIS — M75102 Unspecified rotator cuff tear or rupture of left shoulder, not specified as traumatic: Secondary | ICD-10-CM | POA: Diagnosis not present

## 2021-07-08 DIAGNOSIS — M25512 Pain in left shoulder: Secondary | ICD-10-CM | POA: Diagnosis not present

## 2021-07-08 DIAGNOSIS — M6281 Muscle weakness (generalized): Secondary | ICD-10-CM | POA: Diagnosis not present

## 2021-07-08 DIAGNOSIS — M25612 Stiffness of left shoulder, not elsewhere classified: Secondary | ICD-10-CM | POA: Diagnosis not present

## 2021-07-11 ENCOUNTER — Other Ambulatory Visit: Payer: Self-pay | Admitting: Cardiovascular Disease

## 2021-07-18 DIAGNOSIS — M25512 Pain in left shoulder: Secondary | ICD-10-CM | POA: Diagnosis not present

## 2021-07-25 DIAGNOSIS — M4714 Other spondylosis with myelopathy, thoracic region: Secondary | ICD-10-CM | POA: Diagnosis not present

## 2021-07-25 DIAGNOSIS — M4712 Other spondylosis with myelopathy, cervical region: Secondary | ICD-10-CM | POA: Diagnosis not present

## 2021-07-25 DIAGNOSIS — G894 Chronic pain syndrome: Secondary | ICD-10-CM | POA: Diagnosis not present

## 2021-07-25 DIAGNOSIS — M5106 Intervertebral disc disorders with myelopathy, lumbar region: Secondary | ICD-10-CM | POA: Diagnosis not present

## 2021-07-25 DIAGNOSIS — Z79899 Other long term (current) drug therapy: Secondary | ICD-10-CM | POA: Diagnosis not present

## 2021-07-31 ENCOUNTER — Other Ambulatory Visit: Payer: Self-pay | Admitting: Orthopaedic Surgery

## 2021-07-31 DIAGNOSIS — M542 Cervicalgia: Secondary | ICD-10-CM

## 2021-07-31 DIAGNOSIS — M546 Pain in thoracic spine: Secondary | ICD-10-CM

## 2021-08-27 DIAGNOSIS — Z0000 Encounter for general adult medical examination without abnormal findings: Secondary | ICD-10-CM | POA: Diagnosis not present

## 2021-08-27 DIAGNOSIS — M069 Rheumatoid arthritis, unspecified: Secondary | ICD-10-CM | POA: Diagnosis not present

## 2021-08-27 DIAGNOSIS — I10 Essential (primary) hypertension: Secondary | ICD-10-CM | POA: Diagnosis not present

## 2021-08-27 DIAGNOSIS — I4891 Unspecified atrial fibrillation: Secondary | ICD-10-CM | POA: Diagnosis not present

## 2021-08-27 DIAGNOSIS — F339 Major depressive disorder, recurrent, unspecified: Secondary | ICD-10-CM | POA: Diagnosis not present

## 2021-08-29 ENCOUNTER — Ambulatory Visit: Payer: BC Managed Care – PPO | Admitting: Cardiovascular Disease

## 2021-09-04 ENCOUNTER — Ambulatory Visit
Admission: RE | Admit: 2021-09-04 | Discharge: 2021-09-04 | Disposition: A | Discharge status: Home / Self Care | Payer: BC Managed Care – PPO | Source: Ambulatory Visit | Attending: Orthopaedic Surgery | Admitting: Orthopaedic Surgery

## 2021-09-04 ENCOUNTER — Other Ambulatory Visit: Payer: Self-pay

## 2021-09-04 DIAGNOSIS — Z79899 Other long term (current) drug therapy: Secondary | ICD-10-CM | POA: Diagnosis not present

## 2021-09-04 DIAGNOSIS — E7800 Pure hypercholesterolemia, unspecified: Secondary | ICD-10-CM | POA: Diagnosis not present

## 2021-09-04 DIAGNOSIS — Z131 Encounter for screening for diabetes mellitus: Secondary | ICD-10-CM | POA: Diagnosis not present

## 2021-09-04 DIAGNOSIS — M150 Primary generalized (osteo)arthritis: Secondary | ICD-10-CM | POA: Diagnosis not present

## 2021-09-04 DIAGNOSIS — M059 Rheumatoid arthritis with rheumatoid factor, unspecified: Secondary | ICD-10-CM | POA: Diagnosis not present

## 2021-09-04 DIAGNOSIS — M2550 Pain in unspecified joint: Secondary | ICD-10-CM | POA: Diagnosis not present

## 2021-09-04 DIAGNOSIS — Z125 Encounter for screening for malignant neoplasm of prostate: Secondary | ICD-10-CM | POA: Diagnosis not present

## 2021-09-04 DIAGNOSIS — M542 Cervicalgia: Secondary | ICD-10-CM | POA: Diagnosis not present

## 2021-09-04 DIAGNOSIS — M47812 Spondylosis without myelopathy or radiculopathy, cervical region: Secondary | ICD-10-CM | POA: Diagnosis not present

## 2021-09-04 DIAGNOSIS — M47814 Spondylosis without myelopathy or radiculopathy, thoracic region: Secondary | ICD-10-CM | POA: Diagnosis not present

## 2021-09-04 DIAGNOSIS — M4802 Spinal stenosis, cervical region: Secondary | ICD-10-CM | POA: Diagnosis not present

## 2021-09-04 DIAGNOSIS — M546 Pain in thoracic spine: Secondary | ICD-10-CM

## 2021-09-17 ENCOUNTER — Other Ambulatory Visit: Payer: Self-pay | Admitting: *Deleted

## 2021-09-17 MED ORDER — EZETIMIBE 10 MG PO TABS: 10.0000 mg | ORAL_TABLET | Freq: Every day | ORAL | 1 refills | Status: DC

## 2021-09-19 ENCOUNTER — Other Ambulatory Visit: Payer: Self-pay

## 2021-10-02 DIAGNOSIS — Z8546 Personal history of malignant neoplasm of prostate: Secondary | ICD-10-CM | POA: Diagnosis not present

## 2021-10-02 DIAGNOSIS — N401 Enlarged prostate with lower urinary tract symptoms: Secondary | ICD-10-CM | POA: Diagnosis not present

## 2021-10-02 DIAGNOSIS — R3915 Urgency of urination: Secondary | ICD-10-CM | POA: Diagnosis not present

## 2021-10-02 DIAGNOSIS — R3914 Feeling of incomplete bladder emptying: Secondary | ICD-10-CM | POA: Diagnosis not present

## 2021-10-04 ENCOUNTER — Other Ambulatory Visit: Payer: Self-pay | Admitting: Cardiovascular Disease

## 2021-10-10 DIAGNOSIS — M4714 Other spondylosis with myelopathy, thoracic region: Secondary | ICD-10-CM | POA: Diagnosis not present

## 2021-10-10 DIAGNOSIS — M791 Myalgia, unspecified site: Secondary | ICD-10-CM | POA: Diagnosis not present

## 2021-10-10 DIAGNOSIS — M4712 Other spondylosis with myelopathy, cervical region: Secondary | ICD-10-CM | POA: Diagnosis not present

## 2021-10-10 DIAGNOSIS — M5106 Intervertebral disc disorders with myelopathy, lumbar region: Secondary | ICD-10-CM | POA: Diagnosis not present

## 2021-10-10 DIAGNOSIS — G894 Chronic pain syndrome: Secondary | ICD-10-CM | POA: Diagnosis not present

## 2021-10-18 ENCOUNTER — Ambulatory Visit: Payer: BC Managed Care – PPO | Admitting: Cardiovascular Disease

## 2021-10-23 IMAGING — MR MR THORACIC SPINE W/O CM
4 of 5 series · 16 of 48 positions shown · non-contrast
Comparison: None.

CLINICAL DATA: Back pain.  No history of injury or cancer.

EXAM:
MRI THORACIC SPINE WITHOUT CONTRAST
TECHNIQUE: Multiplanar, multisequence MR imaging of the thoracic spine was
performed. No intravenous contrast was administered.

[Series 4: T2 · sagittal · 3.0mm · 0.55mm/px · 7 of 21 slices shown (1 of 2)]
[im 1/21]
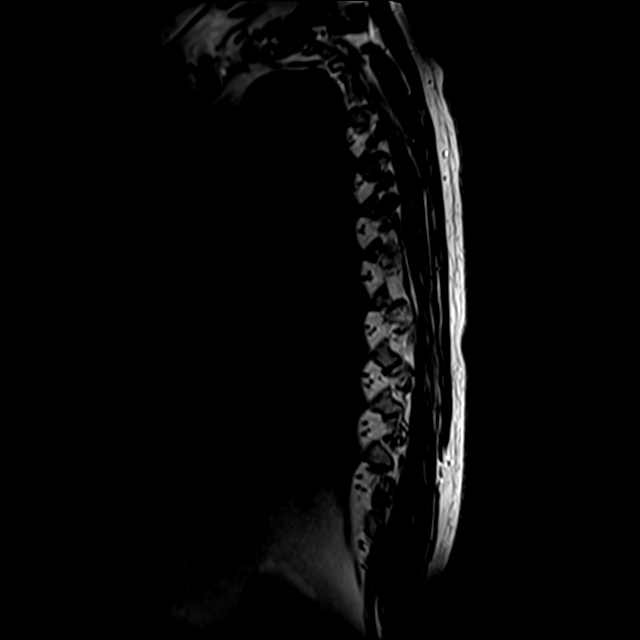
[im 3/21]
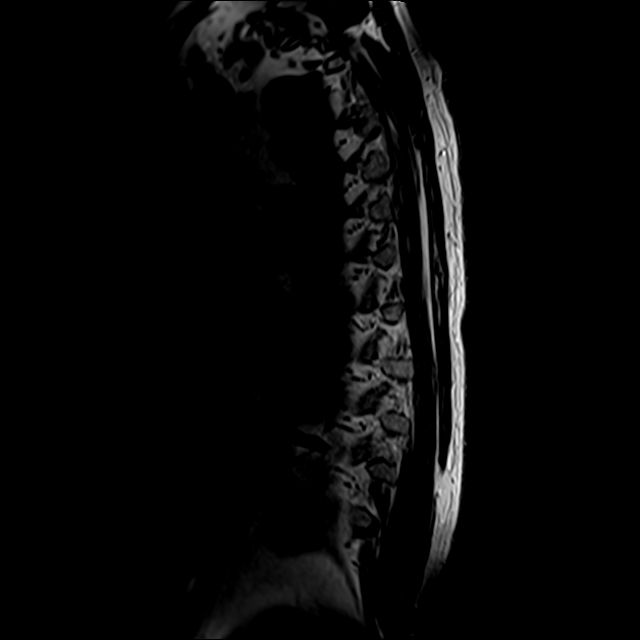
[im 6/21]
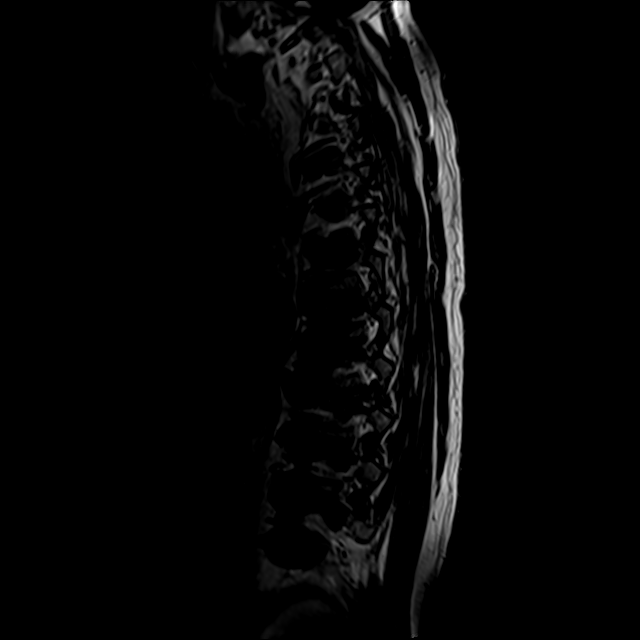
[im 9/21]
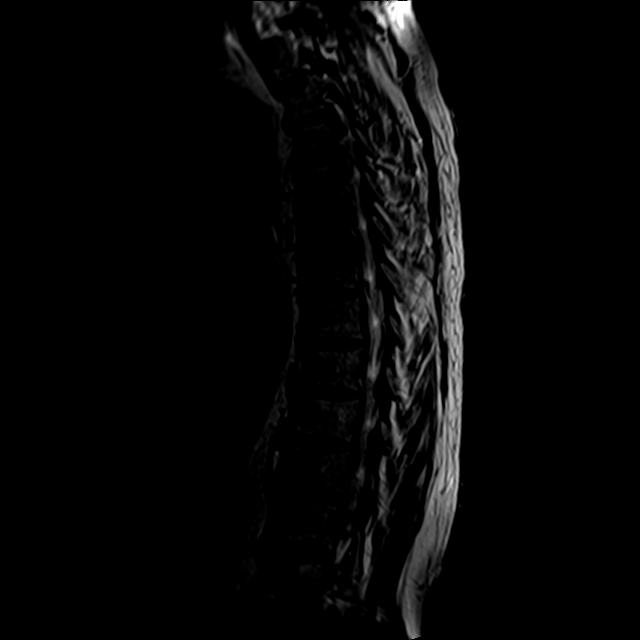
[im 12/21]
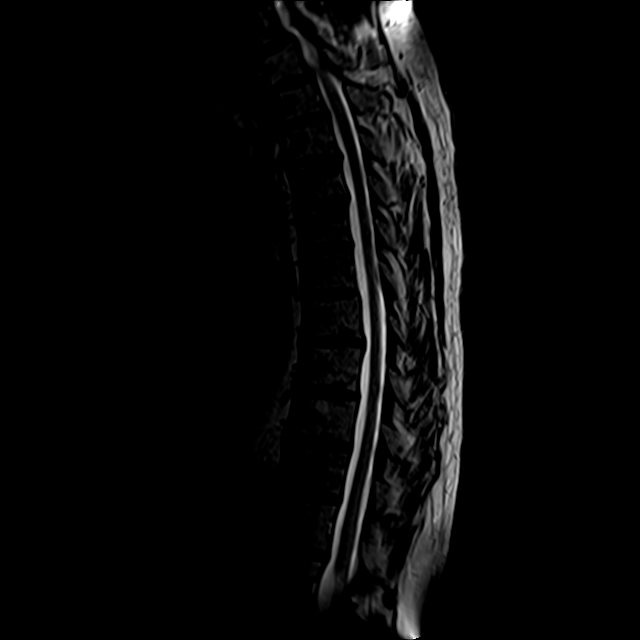
[im 15/21]
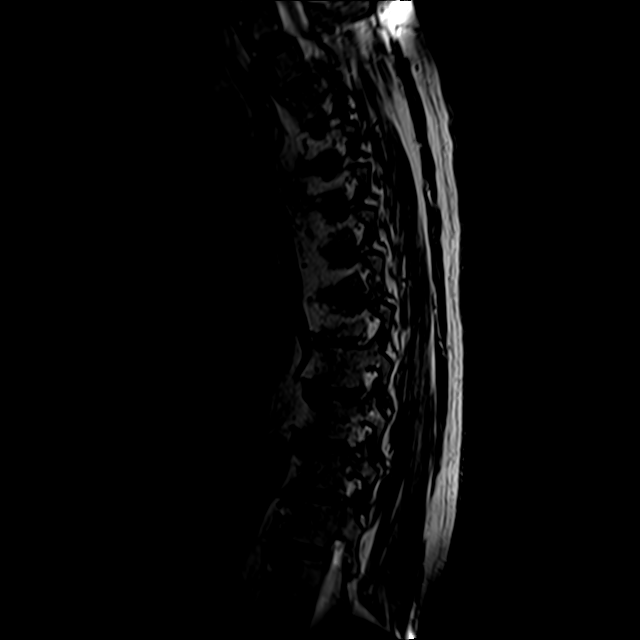
[im 18/21]
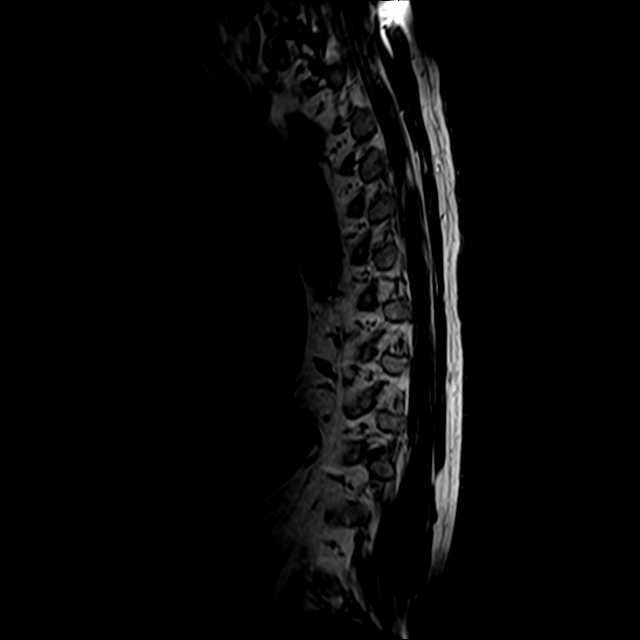

[Series 5: STIR · sagittal · 3.0mm · 1.09mm/px · 3 of 21 slices shown]
[im 4/21]
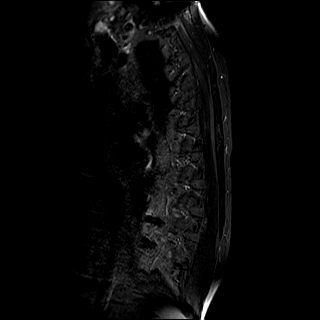
[im 11/21]
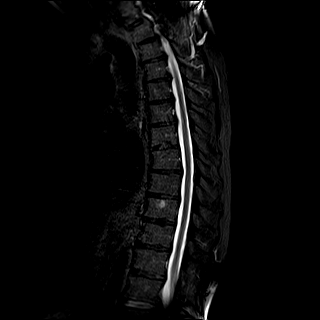
[im 17/21]
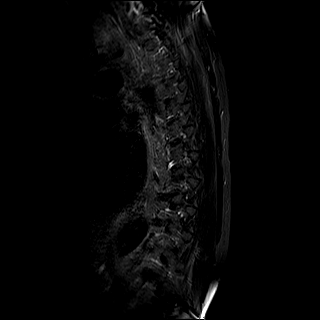

[Series 6: T1 · sagittal · 3.0mm · 0.55mm/px · 3 of 21 slices shown]
[im 4/21]
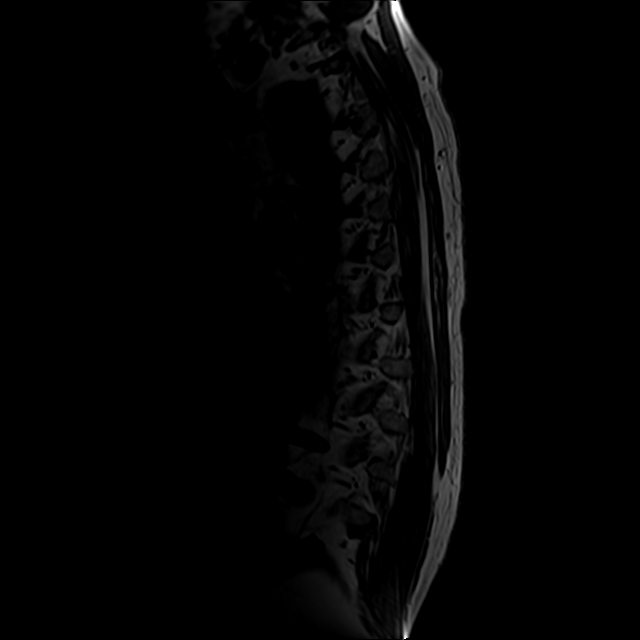
[im 11/21]
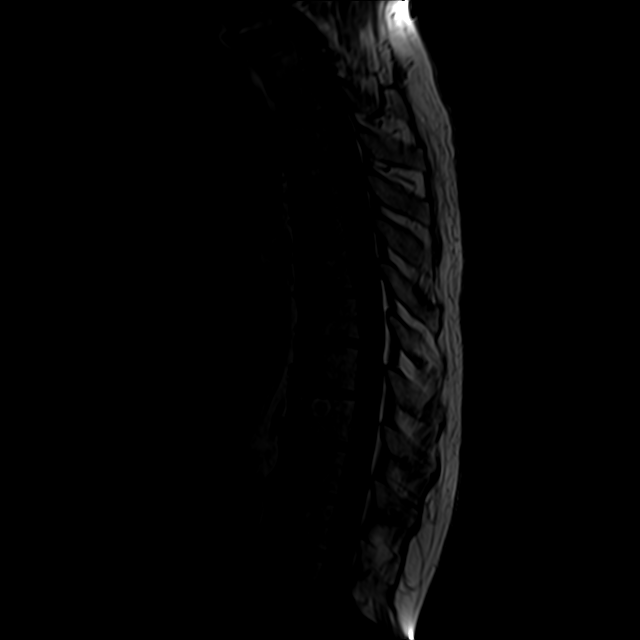
[im 17/21]
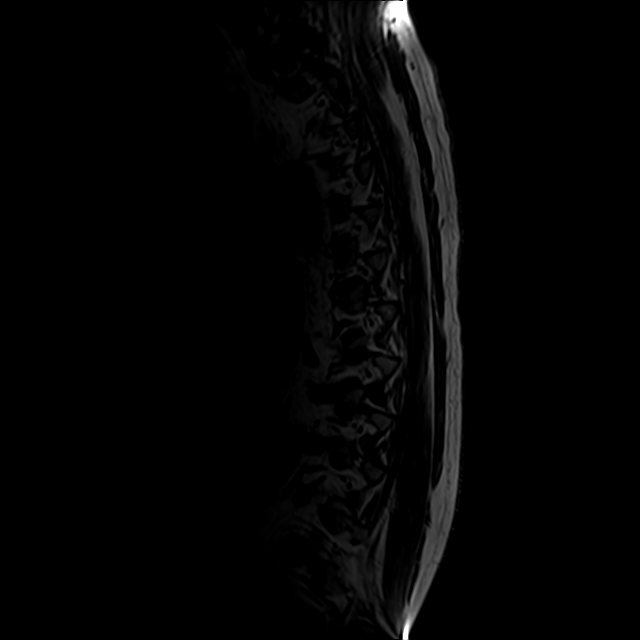

[Series 7: T2 · axial · 4.0mm · 0.39mm/px · z∈[-307,-131]mm · 3 of 36 slices shown (2 of 2)]
[im 6/36]
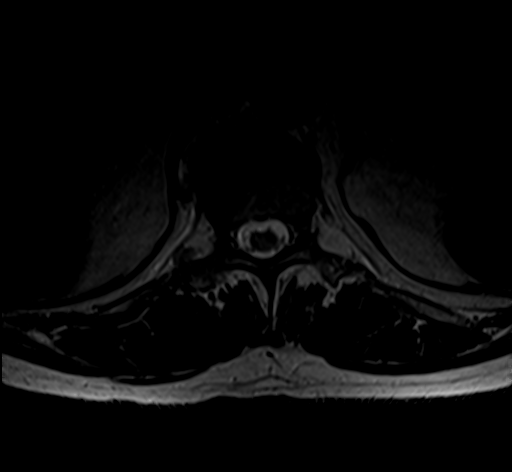
[im 18/36]
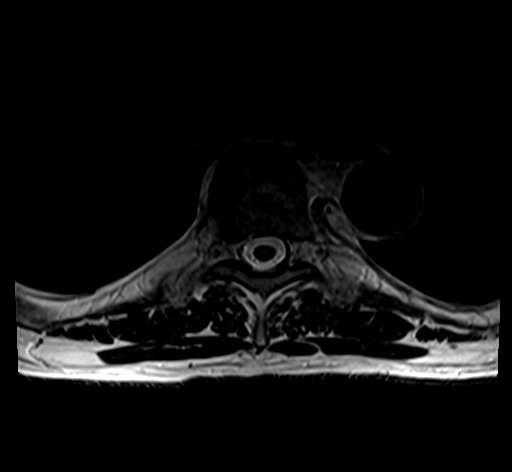
[im 30/36]
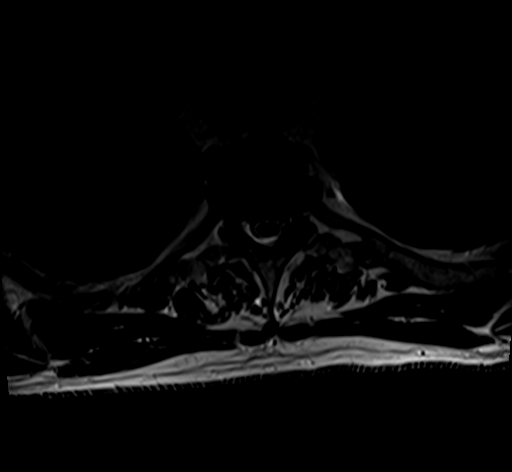

[16 of 48 positions shown; findings below may reference images not displayed]

FINDINGS: Alignment: 3 mm anterolisthesis C7-T1. Mild anterolisthesis T1-2.
Mild retrolisthesis T4-5.

Vertebrae: Negative for fracture or mass. Small hemangioma T9
vertebral body.

Cord:  Normal signal and morphology.

Paraspinal and other soft tissues: Negative for paraspinous mass,
adenopathy, or fluid collection.

Disc levels:

Multilevel degenerative change in the cervical spine. There is disc
degeneration and mild spurring at multiple levels. There is a small
to moderate right-sided disc protrusion at T5-6. No significant
stenosis or neural impingement identified.
IMPRESSION: Multilevel disc degeneration and spurring throughout the thoracic
spine. Small to moderate right-sided disc protrusion T5-6. No
significant neural impingement.

## 2021-12-05 ENCOUNTER — Other Ambulatory Visit: Payer: Self-pay

## 2021-12-05 MED ORDER — TAMSULOSIN HCL 0.4 MG PO CAPS: 0.4000 mg | ORAL_CAPSULE | Freq: Every day | ORAL | 3 refills | Status: AC

## 2021-12-10 ENCOUNTER — Other Ambulatory Visit: Payer: Self-pay | Admitting: Cardiovascular Disease

## 2021-12-27 DIAGNOSIS — G894 Chronic pain syndrome: Secondary | ICD-10-CM | POA: Diagnosis not present

## 2021-12-27 DIAGNOSIS — M4712 Other spondylosis with myelopathy, cervical region: Secondary | ICD-10-CM | POA: Diagnosis not present

## 2021-12-27 DIAGNOSIS — M5106 Intervertebral disc disorders with myelopathy, lumbar region: Secondary | ICD-10-CM | POA: Diagnosis not present

## 2021-12-27 DIAGNOSIS — M4714 Other spondylosis with myelopathy, thoracic region: Secondary | ICD-10-CM | POA: Diagnosis not present

## 2022-02-04 DIAGNOSIS — L918 Other hypertrophic disorders of the skin: Secondary | ICD-10-CM | POA: Diagnosis not present

## 2022-02-04 DIAGNOSIS — L570 Actinic keratosis: Secondary | ICD-10-CM | POA: Diagnosis not present

## 2022-02-04 DIAGNOSIS — L309 Dermatitis, unspecified: Secondary | ICD-10-CM | POA: Diagnosis not present

## 2022-02-04 DIAGNOSIS — D485 Neoplasm of uncertain behavior of skin: Secondary | ICD-10-CM | POA: Diagnosis not present

## 2022-02-04 DIAGNOSIS — Z23 Encounter for immunization: Secondary | ICD-10-CM | POA: Diagnosis not present

## 2022-02-24 DIAGNOSIS — M25512 Pain in left shoulder: Secondary | ICD-10-CM | POA: Diagnosis not present

## 2022-02-24 DIAGNOSIS — M25562 Pain in left knee: Secondary | ICD-10-CM | POA: Diagnosis not present

## 2022-02-25 ENCOUNTER — Telehealth: Payer: Self-pay | Admitting: Cardiovascular Disease

## 2022-02-25 DIAGNOSIS — Z0100 Encounter for examination of eyes and vision without abnormal findings: Secondary | ICD-10-CM | POA: Diagnosis not present

## 2022-02-25 DIAGNOSIS — H5200 Hypermetropia, unspecified eye: Secondary | ICD-10-CM | POA: Diagnosis not present

## 2022-02-25 DIAGNOSIS — E7800 Pure hypercholesterolemia, unspecified: Secondary | ICD-10-CM | POA: Diagnosis not present

## 2022-02-25 NOTE — Telephone Encounter (Signed)
Spoke to patient he stated he feels like heart has been out of rhythm for the past 3 days.He gets sob.Rate 108.Stated he took a whole Amiodarone 200 mg and rate has slowed down.Appointment scheduled with Afib clinic tomorrow 3/8 at 11:00 am.Parking code 1102. Advised I will make Dr.Kelly aware. ?

## 2022-02-25 NOTE — Telephone Encounter (Signed)
?  Per MyChart scheduling message: ? ?Patient c/o Palpitations:  High priority if patient c/o lightheadedness, shortness of breath, or chest pain ? ?How long have you had palpitations/irregular HR/ Afib? Are you having the symptoms now?  ? ?Are you currently experiencing lightheadedness, SOB or CP?  ? ?Do you have a history of afib (atrial fibrillation) or irregular heart rhythm?  ? ?Have you checked your BP or HR? (document readings if available):  ? ?Are you experiencing any other symptoms?  ? ? ?Patient response: ? ?Been 3 or 4 days, sob headache  have had 2 cardio version done   ?

## 2022-02-26 ENCOUNTER — Other Ambulatory Visit: Payer: Self-pay

## 2022-02-26 ENCOUNTER — Encounter (HOSPITAL_COMMUNITY): Payer: Self-pay | Admitting: Nurse Practitioner

## 2022-02-26 ENCOUNTER — Ambulatory Visit (HOSPITAL_COMMUNITY)
Admission: RE | Admit: 2022-02-26 | Discharge: 2022-02-26 | Disposition: A | Discharge status: Home / Self Care | Payer: Medicare HMO | Source: Ambulatory Visit | Attending: Nurse Practitioner | Admitting: Nurse Practitioner

## 2022-02-26 VITALS — BP 146/84 | HR 95 | Ht 73.0 in | Wt 235.2 lb

## 2022-02-26 DIAGNOSIS — I4891 Unspecified atrial fibrillation: Secondary | ICD-10-CM | POA: Diagnosis not present

## 2022-02-26 DIAGNOSIS — I480 Paroxysmal atrial fibrillation: Secondary | ICD-10-CM | POA: Diagnosis not present

## 2022-02-26 DIAGNOSIS — Z7901 Long term (current) use of anticoagulants: Secondary | ICD-10-CM | POA: Insufficient documentation

## 2022-02-26 DIAGNOSIS — Z91199 Patient's noncompliance with other medical treatment and regimen due to unspecified reason: Secondary | ICD-10-CM | POA: Insufficient documentation

## 2022-02-26 DIAGNOSIS — Z79899 Other long term (current) drug therapy: Secondary | ICD-10-CM | POA: Insufficient documentation

## 2022-02-26 DIAGNOSIS — R9431 Abnormal electrocardiogram [ECG] [EKG]: Secondary | ICD-10-CM | POA: Insufficient documentation

## 2022-02-26 DIAGNOSIS — D6869 Other thrombophilia: Secondary | ICD-10-CM

## 2022-02-26 MED ORDER — AMIODARONE HCL 200 MG PO TABS: ORAL_TABLET | ORAL | 0 refills | Status: DC

## 2022-02-26 NOTE — Telephone Encounter (Signed)
Acknowledged.

## 2022-02-26 NOTE — Progress Notes (Signed)
Primary Care Physician: Deland Pretty, MD Referring Physician: Dr. Brendolyn Patty is a 71 y.o. male with a h/o afib that had been quiet on amiodarone for years. One year ago because of abstinence of afib, amio was reduced to 100 mg daily. He recently returned to afib in the last 1-2 days. He took a whole amio 200 mg tab this am. He is rate controlled. No recent illness or change in lifestyle. He is not using cpap. No alcohol, 2-3 cups of coffee daily. No tobacco use but he has started drinking 2 energy drinks a day. He is on xarelto 20 mg daily.   Today, he denies symptoms of palpitations, chest pain, shortness of breath, orthopnea, PND, lower extremity edema, dizziness, presyncope, syncope, or neurologic sequela. The patient is tolerating medications without difficulties and is otherwise without complaint today.   Past Medical History:  Diagnosis Date   Anxiety    Arthritis    Atrial fibrillation (HCC)    Benign essential tremor    no meds taken   Coronary artery disease    history of Atrial Fib.   Depression    GERD (gastroesophageal reflux disease)    History of kidney stones    Hyperlipidemia    Hypertension    Low testosterone    Palpitations    Prostate cancer (HCC)    Rheumatoid arthritis ([place])    Sleep apnea    has CPAP but not worn in 6 months   Past Surgical History:  Procedure Laterality Date   BACK SURGERY  2010   lower   CARDIAC CATHETERIZATION  05/14/2004   Contiued medical therapy   CARDIOVASCULAR STRESS TEST  09/19/2009   Observed defect is consistent with diaphragmatic attentuation. EKG negative for ischemia.   CARDIOVERSION N/A 11/20/2014   Procedure: CARDIOVERSION;  Surgeon: [place] Sine, MD;  Location: [facility] ENDOSCOPY;  Service: Cardiovascular;  Laterality: N/A;   CARDIOVERSION N/A 02/19/2016   Procedure: CARDIOVERSION;  Surgeon: [place] Sine, MD;  Location: [facility] ENDOSCOPY;  Service: Cardiovascular;  Laterality: N/A;   CERVICAL FUSION  2005    CYSTOSCOPY N/A 10/25/2020   Procedure: CYSTOSCOPY;  Surgeon: Irine Seal, MD;  Location: [facility];  Service: Urology;  Laterality: N/A;   EXTRACORPOREAL SHOCK WAVE LITHOTRIPSY Right 05/09/2019   Procedure: EXTRACORPOREAL SHOCK WAVE LITHOTRIPSY (ESWL);  Surgeon: Festus Aloe, MD;  Location: [facility];  Service: Urology;  Laterality: Right;   [place], 2007   stent insertion, stone basket    PROSTATE BIOPSY  2021   RADIOACTIVE SEED IMPLANT N/A 10/25/2020   Procedure: RADIOACTIVE SEED IMPLANT/BRACHYTHERAPY IMPLANT;  Surgeon: Irine Seal, MD;  Location: [facility];  Service: Urology;  Laterality: N/A;   RESECTION DISTAL CLAVICAL Right 07/06/2019   Procedure: OPEN DISTAL CLAVICLE EXCISION;  Surgeon: [place]ie Server, MD;  Location: [place];  Service: Orthopedics;  Laterality: Right;   SHOULDER ARTHROSCOPY WITH ROTATOR CUFF REPAIR AND SUBACROMIAL DECOMPRESSION Right 07/06/2019   Procedure: SHOULDER ARTHROSCOPY WITH OPEN ROTATOR CUFF REPAIR, SUBACROMIAL DECOMPRESSION WITH EXTENSIVE DEBRIDEMENT;  Surgeon: Earlie Server, MD;  Location: [place];  Service: Orthopedics;  Laterality: Right;   SHOULDER ARTHROSCOPY WITH ROTATOR CUFF REPAIR AND SUBACROMIAL DECOMPRESSION Left 04/24/2021   Procedure: SHOULDER ARTHROSCOPY WITH  SUBACROMIAL DECOMPRESSION;  Surgeon: Earlie Server, MD;  Location: [place];  Service: Orthopedics;  Laterality: Left;   SHOULDER OPEN ROTATOR CUFF REPAIR Left 04/24/2021   Procedure: OPEN ROTATOR CUFF  REPAIR SHOULDER DISTAL CLAVICLE RESECTION;  Surgeon: Earlie Server, MD;  Location: [place];  Service: Orthopedics;  Laterality: Left;   SPACE OAR INSTILLATION N/A 10/25/2020   Procedure: SPACE OAR INSTILLATION;  Surgeon: Irine Seal, MD;  Location: [facility];  Service: Urology;  Laterality: N/A;   TONSILLECTOMY     as a child   TRANSTHORACIC ECHOCARDIOGRAM   03/29/2008   EF 67%, mild mitral and tricuspid valve regurg.    Current Outpatient Medications  Medication Sig Dispense Refill   celecoxib (CELEBREX) 200 MG capsule Take 200 mg by mouth daily.     ezetimibe (ZETIA) 10 MG tablet Take 1 tablet (10 mg total) by mouth daily. 90 tablet 1   fexofenadine (ALLEGRA) 180 MG tablet Take 180 mg by mouth at bedtime.      fluticasone (FLONASE) 50 MCG/ACT nasal spray Place into both nostrils as needed.     Glucos-Chondroit-Hyaluron-MSM (GLUCOSAMINE CHONDROITIN JOINT PO) Take 2 tablets by mouth 2 (two) times daily.     lansoprazole (PREVACID) 15 MG capsule Take 15 mg by mouth at bedtime.     losartan (COZAAR) 50 MG tablet TAKE 1 TABLET TWICE A DAY 180 tablet 3   oxyCODONE (OXY IR/ROXICODONE) 5 MG immediate release tablet Take one tab po q4-6hrs prn pain, may need 1-2 first couple days 40 tablet 0   rosuvastatin (CRESTOR) 20 MG tablet TAKE 1 TABLET DAILY 30 tablet 0   sertraline (ZOLOFT) 100 MG tablet Take 100 mg by mouth daily.     tamsulosin (FLOMAX) 0.4 MG CAPS capsule Take 1 capsule (0.4 mg total) by mouth daily. 90 capsule 3   XARELTO 20 MG TABS tablet TAKE 1 TABLET DAILY WITH SUPPER 90 tablet 3   amiodarone (PACERONE) 200 MG tablet Take 1 tablet by mouth twice a day for the next month then reduce to 1 tablet daily 60 tablet 0   Multiple Vitamin (MULTIVITAMIN) tablet Take 1 tablet by mouth daily. (Patient not taking: Reported on 02/26/2022)     No current facility-administered medications for this encounter.    Allergies  Allergen Reactions   Penicillins     Childhood allergy    Social History   Socioeconomic History   Marital status: Widowed    Spouse name: Not on file   Number of children: Not on file   Years of education: Not on file   Highest education level: Not on file  Occupational History   Occupation: retired    Comment: [employer]  Tobacco Use   Smoking status: Former    Packs/day: 0.25    Years: 40.00    Pack years: 10.00     Types: Cigarettes    Quit date: 01/25/2004    Years since quitting: 18.1   Smokeless tobacco: Never  Vaping Use   Vaping Use: Never used  Substance and Sexual Activity   Alcohol use: Not Currently    Alcohol/week: 4.0 standard drinks    Types: 4 Standard drinks or equivalent per week    Comment: couple times a month   Drug use: No   Sexual activity: Not Currently  Other Topics Concern   Not on file  Social History Narrative   One of nine siblings (6 girls and 3 boys). Lost wife to cancer of the sinus approximately ten years ago (2011).    Social Determinants of Health   Financial Resource Strain: Not on file  Food Insecurity: Not on file  Transportation Needs: Not on file  Physical  Activity: Not on file  Stress: Not on file  Social Connections: Not on file  Intimate Partner Violence: Not on file    Family History  Problem Relation Age of Onset   Diabetes Mother    Kidney failure Mother    Heart disease Mother    Prostate cancer Father    Alzheimer's disease Father    Brain cancer Sister    Lung cancer Brother    Heart disease Sister    Tremor Daughter    Alopecia Daughter     ROS- All systems are reviewed and negative except as per the HPI above  Physical Exam: Vitals:   02/26/22 1107  BP: (!) 146/84  Pulse: 95  Weight: 106.7 kg  Height: '6\' 1"'$  (1.854 m)   Wt Readings from Last 3 Encounters:  02/26/22 106.7 kg  04/24/21 98.2 kg  03/22/21 98.9 kg    Labs: Lab Results  Component Value Date   NA 144 10/22/2020   K 4.0 10/22/2020   CL 109 10/22/2020   CO2 25 10/22/2020   GLUCOSE 104 (H) 10/22/2020   BUN 23 10/22/2020   CREATININE 0.93 10/22/2020   CALCIUM 8.8 (L) 10/22/2020   MG 1.7 12/10/2015   Lab Results  Component Value Date   INR 1.0 10/22/2020   Lab Results  Component Value Date   CHOL 258 (H) 10/22/2020   HDL 45 10/22/2020   LDLCALC 196 (H) 10/22/2020   TRIG 84 10/22/2020     GEN- The patient is well appearing, alert and oriented x  3 today.   Head- normocephalic, atraumatic Eyes-  Sclera clear, conjunctiva pink Ears- hearing intact Oropharynx- clear Neck- supple, no JVP Lymph- no cervical lymphadenopathy Lungs- Clear to ausculation bilaterally, normal work of breathing Heart- irregular rate and rhythm, no murmurs, rubs or gallops, PMI not laterally displaced GI- soft, NT, ND, + BS Extremities- no clubbing, cyanosis, or edema MS- no significant deformity or atrophy Skin- no rash or lesion Psych- euthymic mood, full affect Neuro- strength and sensation are intact  EKG-afib at 95 bpm, qrs int 100 ms, qtc 444 ms     Assessment and Plan:  1. Afib Recent return to afib Rate controlled Encouraged to use cpap Avoid energy drinks  Increase amiodarone to 200 mg bid   2. CHA2DS2VASc  score of 2 Continue xarelto 20 mg daily without any missed doses   I will see back in 10 days If still out of rhythm will cardiovert Consideration to update echo and refer for ablation to forego need for amiodarone in the future if echo is  favorable   Nathan Mcclure, [facility] [address] [place], [place] [address] [phone]

## 2022-02-26 NOTE — Patient Instructions (Signed)
Please use CPAP machine and stop energy drinks ? ?Increase amiodarone to '200mg'$  twice a day  ?

## 2022-03-05 DIAGNOSIS — Z23 Encounter for immunization: Secondary | ICD-10-CM | POA: Diagnosis not present

## 2022-03-05 DIAGNOSIS — E7800 Pure hypercholesterolemia, unspecified: Secondary | ICD-10-CM | POA: Diagnosis not present

## 2022-03-05 DIAGNOSIS — I10 Essential (primary) hypertension: Secondary | ICD-10-CM | POA: Diagnosis not present

## 2022-03-05 DIAGNOSIS — I4891 Unspecified atrial fibrillation: Secondary | ICD-10-CM | POA: Diagnosis not present

## 2022-03-05 DIAGNOSIS — Z7901 Long term (current) use of anticoagulants: Secondary | ICD-10-CM | POA: Diagnosis not present

## 2022-03-05 DIAGNOSIS — R739 Hyperglycemia, unspecified: Secondary | ICD-10-CM | POA: Diagnosis not present

## 2022-03-05 DIAGNOSIS — E7801 Familial hypercholesterolemia: Secondary | ICD-10-CM | POA: Diagnosis not present

## 2022-03-06 ENCOUNTER — Encounter (HOSPITAL_COMMUNITY): Payer: Self-pay | Admitting: Nurse Practitioner

## 2022-03-06 ENCOUNTER — Ambulatory Visit (HOSPITAL_COMMUNITY)
Admission: RE | Admit: 2022-03-06 | Discharge: 2022-03-06 | Disposition: A | Discharge status: Home / Self Care | Payer: Medicare HMO | Source: Ambulatory Visit | Attending: Nurse Practitioner | Admitting: Nurse Practitioner

## 2022-03-06 ENCOUNTER — Other Ambulatory Visit: Payer: Self-pay

## 2022-03-06 VITALS — BP 132/84 | HR 83 | Ht 73.0 in | Wt 228.8 lb

## 2022-03-06 DIAGNOSIS — I4891 Unspecified atrial fibrillation: Secondary | ICD-10-CM | POA: Diagnosis not present

## 2022-03-06 DIAGNOSIS — R9431 Abnormal electrocardiogram [ECG] [EKG]: Secondary | ICD-10-CM | POA: Diagnosis not present

## 2022-03-06 DIAGNOSIS — Z9989 Dependence on other enabling machines and devices: Secondary | ICD-10-CM | POA: Diagnosis not present

## 2022-03-06 DIAGNOSIS — Z79899 Other long term (current) drug therapy: Secondary | ICD-10-CM | POA: Insufficient documentation

## 2022-03-06 DIAGNOSIS — Z8249 Family history of ischemic heart disease and other diseases of the circulatory system: Secondary | ICD-10-CM | POA: Insufficient documentation

## 2022-03-06 DIAGNOSIS — D6869 Other thrombophilia: Secondary | ICD-10-CM | POA: Diagnosis not present

## 2022-03-06 DIAGNOSIS — Z7901 Long term (current) use of anticoagulants: Secondary | ICD-10-CM | POA: Insufficient documentation

## 2022-03-06 DIAGNOSIS — I4819 Other persistent atrial fibrillation: Secondary | ICD-10-CM | POA: Diagnosis not present

## 2022-03-06 NOTE — Progress Notes (Signed)
? ?Primary Care Physician: Deland Pretty, MD ?Referring Physician: Dr. Claiborne Billings ? ? ?Nathan Mcclure is a 72 y.o. male with a h/o afib that had been quiet on amiodarone for years. One year ago because of abstinence of afib, amio was reduced to 100 mg daily. He recently returned to afib in the last 1-2 days. He took a whole amio 200 mg tab this am. He is rate controlled. No recent illness or change in lifestyle. He is not using cpap. No alcohol, 2-3 cups of coffee daily. No tobacco use but he has started drinking 2 energy drinks a day. He is on xarelto 20 mg daily.  ? ?F/u in afib clinic. He continues to be in afib rate controlled. He is reloading on amio 200 mg 2x a day and we discussed pursing cardioversion in a couple of weeks, he is in agreement. He is using cpap now and has stopped energy drinks. No missed xarelto.  ? ?Today, he denies symptoms of palpitations, chest pain, shortness of breath, orthopnea, PND, lower extremity edema, dizziness, presyncope, syncope, or neurologic sequela. The patient is tolerating medications without difficulties and is otherwise without complaint today.  ? ?Past Medical History:  ?Diagnosis Date  ? Anxiety   ? Arthritis   ? Atrial fibrillation ([place])   ? Benign essential tremor   ? no meds taken  ? Coronary artery disease   ? history of Atrial Fib.  ? Depression   ? GERD (gastroesophageal reflux disease)   ? History of kidney stones   ? Hyperlipidemia   ? Hypertension   ? Low testosterone   ? Palpitations   ? Prostate cancer ([place])   ? Rheumatoid arthritis ([place])   ? Sleep apnea   ? has CPAP but not worn in 6 months  ? ?Past Surgical History:  ?Procedure Laterality Date  ? BACK SURGERY  2010  ? lower  ? CARDIAC CATHETERIZATION  05/14/2004  ? Contiued medical therapy  ? CARDIOVASCULAR STRESS TEST  09/19/2009  ? Observed defect is consistent with diaphragmatic attentuation. EKG negative for ischemia.  ? CARDIOVERSION N/A 11/20/2014  ? Procedure: CARDIOVERSION;  Surgeon: [place] Sine,  MD;  Location: [place];  Service: Cardiovascular;  Laterality: N/A;  ? CARDIOVERSION N/A 02/19/2016  ? Procedure: CARDIOVERSION;  Surgeon: [place] Sine, MD;  Location: [place];  Service: Cardiovascular;  Laterality: N/A;  ? CERVICAL FUSION  2005  ? CYSTOSCOPY N/A 10/25/2020  ? Procedure: CYSTOSCOPY;  Surgeon: Irine Seal, MD;  Location: [facility];  Service: Urology;  Laterality: N/A;  ? EXTRACORPOREAL SHOCK WAVE LITHOTRIPSY Right 05/09/2019  ? Procedure: EXTRACORPOREAL SHOCK WAVE LITHOTRIPSY (ESWL);  Surgeon: Festus Aloe, MD;  Location: [facility];  Service: Urology;  Laterality: Right;  ? [place], 2007  ? stent insertion, stone basket   ? PROSTATE BIOPSY  2021  ? RADIOACTIVE SEED IMPLANT N/A 10/25/2020  ? Procedure: RADIOACTIVE SEED IMPLANT/BRACHYTHERAPY IMPLANT;  Surgeon: Irine Seal, MD;  Location: [facility];  Service: Urology;  Laterality: N/A;  ? RESECTION DISTAL CLAVICAL Right 07/06/2019  ? Procedure: OPEN DISTAL CLAVICLE EXCISION;  Surgeon: Earlie Server, MD;  Location: [place];  Service: Orthopedics;  Laterality: Right;  ? SHOULDER ARTHROSCOPY WITH ROTATOR CUFF REPAIR AND SUBACROMIAL DECOMPRESSION Right 07/06/2019  ? Procedure: SHOULDER ARTHROSCOPY WITH OPEN ROTATOR CUFF REPAIR, SUBACROMIAL DECOMPRESSION WITH EXTENSIVE DEBRIDEMENT;  Surgeon: Earlie Server, MD;  Location: [place];  Service: Orthopedics;  Laterality: Right;  ? SHOULDER  ARTHROSCOPY WITH ROTATOR CUFF REPAIR AND SUBACROMIAL DECOMPRESSION Left 04/24/2021  ? Procedure: SHOULDER ARTHROSCOPY WITH  SUBACROMIAL DECOMPRESSION;  Surgeon: Earlie Server, MD;  Location: [place];  Service: Orthopedics;  Laterality: Left;  ? SHOULDER OPEN ROTATOR CUFF REPAIR Left 04/24/2021  ? Procedure: OPEN ROTATOR CUFF REPAIR SHOULDER DISTAL CLAVICLE RESECTION;  Surgeon: Earlie Server, MD;  Location: [place];  Service: Orthopedics;   Laterality: Left;  ? SPACE OAR INSTILLATION N/A 10/25/2020  ? Procedure: SPACE OAR INSTILLATION;  Surgeon: Irine Seal, MD;  Location: [facility];  Service: Urology;  Laterality: N/A;  ? TONSILLECTOMY    ? as a child  ? TRANSTHORACIC ECHOCARDIOGRAM  03/29/2008  ? EF 67%, mild mitral and tricuspid valve regurg.  ? ? ?Current Outpatient Medications  ?Medication Sig Dispense Refill  ? amiodarone (PACERONE) 200 MG tablet Take 1 tablet by mouth twice a day for the next month then reduce to 1 tablet daily 60 tablet 0  ? clobetasol cream (TEMOVATE) 0.05 %     ? ezetimibe (ZETIA) 10 MG tablet Take 1 tablet (10 mg total) by mouth daily. 90 tablet 1  ? fexofenadine (ALLEGRA) 180 MG tablet Take 180 mg by mouth at bedtime.     ? fluticasone (FLONASE) 50 MCG/ACT nasal spray Place into both nostrils as needed.    ? lansoprazole (PREVACID) 15 MG capsule Take 15 mg by mouth at bedtime.    ? losartan (COZAAR) 50 MG tablet TAKE 1 TABLET TWICE A DAY 180 tablet 3  ? oxyCODONE (OXY IR/ROXICODONE) 5 MG immediate release tablet Take one tab po q4-6hrs prn pain, may need 1-2 first couple days 40 tablet 0  ? rosuvastatin (CRESTOR) 20 MG tablet TAKE 1 TABLET DAILY 30 tablet 0  ? sertraline (ZOLOFT) 100 MG tablet Take 100 mg by mouth daily.    ? tamsulosin (FLOMAX) 0.4 MG CAPS capsule Take 1 capsule (0.4 mg total) by mouth daily. 90 capsule 3  ? XARELTO 20 MG TABS tablet TAKE 1 TABLET DAILY WITH SUPPER 90 tablet 3  ? ?No current facility-administered medications for this encounter.  ? ? ?Allergies  ?Allergen Reactions  ? Penicillins   ?  Childhood allergy  ? ? ?Social History  ? ?Socioeconomic History  ? Marital status: Widowed  ?  Spouse name: Not on file  ? Number of children: Not on file  ? Years of education: Not on file  ? Highest education level: Not on file  ?Occupational History  ? Occupation: retired  ?  Comment: [employer]  ?Tobacco Use  ? Smoking status: Former  ?  Packs/day: 0.25  ?  Years: 40.00  ?  Pack years: 10.00  ?   Types: Cigarettes  ?  Quit date: 01/25/2004  ?  Years since quitting: 18.1  ? Smokeless tobacco: Never  ?Vaping Use  ? Vaping Use: Never used  ?Substance and Sexual Activity  ? Alcohol use: Not Currently  ?  Alcohol/week: 4.0 standard drinks  ?  Types: 4 Standard drinks or equivalent per week  ?  Comment: couple times a month  ? Drug use: No  ? Sexual activity: Not Currently  ?Other Topics Concern  ? Not on file  ?Social History Narrative  ? One of nine siblings (6 girls and 3 boys). Lost wife to cancer of the sinus approximately ten years ago (2011).   ? ?Social Determinants of Health  ? ?Financial Resource Strain: Not on file  ?Food Insecurity: Not on file  ?  Transportation Needs: Not on file  ?Physical Activity: Not on file  ?Stress: Not on file  ?Social Connections: Not on file  ?Intimate Partner Violence: Not on file  ? ? ?Family History  ?Problem Relation Age of Onset  ? Diabetes Mother   ? Kidney failure Mother   ? Heart disease Mother   ? Prostate cancer Father   ? Alzheimer's disease Father   ? Brain cancer Sister   ? Lung cancer Brother   ? Heart disease Sister   ? Tremor Daughter   ? Alopecia Daughter   ? ? ?ROS- All systems are reviewed and negative except as per the HPI above ? ?Physical Exam: ?Vitals:  ? 03/06/22 0916  ?BP: 132/84  ?Pulse: 83  ?Weight: 103.8 kg  ?Height: '6\' 1"'$  (1.854 m)  ? ?Wt Readings from Last 3 Encounters:  ?03/06/22 103.8 kg  ?02/26/22 106.7 kg  ?04/24/21 98.2 kg  ? ? ?Labs: ?Lab Results  ?Component Value Date  ? NA 144 10/22/2020  ? K 4.0 10/22/2020  ? CL 109 10/22/2020  ? CO2 25 10/22/2020  ? GLUCOSE 104 (H) 10/22/2020  ? BUN 23 10/22/2020  ? CREATININE 0.93 10/22/2020  ? CALCIUM 8.8 (L) 10/22/2020  ? MG 1.7 12/10/2015  ? ?Lab Results  ?Component Value Date  ? INR 1.0 10/22/2020  ? ?Lab Results  ?Component Value Date  ? CHOL 258 (H) 10/22/2020  ? HDL 45 10/22/2020  ? LDLCALC 196 (H) 10/22/2020  ? TRIG 84 10/22/2020  ? ? ? ?GEN- The patient is well appearing, alert and oriented x 3  today.   ?Head- normocephalic, atraumatic ?Eyes-  Sclera clear, conjunctiva pink ?Ears- hearing intact ?Oropharynx- clear ?Neck- supple, no JVP ?Lymph- no cervical lymphadenopathy ?Lungs- Clear to ausculatio

## 2022-03-06 NOTE — Patient Instructions (Signed)
Day of cardioversion reduce amiodarone to once a day ? ?Cardioversion scheduled for Wednesday, April 5th ? - come to clinic for labs at [place] at the [employer] and go to admitting at 10am ? - Do not eat or drink anything after midnight the night prior to your procedure. ? - Take all your morning medication (except diabetic medications) with a sip of water prior to arrival. ? - You will not be able to drive home after your procedure. ? - Do NOT miss any doses of your blood thinner - if you should miss a dose please notify our office immediately. ? - If you feel as if you go back into normal rhythm prior to scheduled cardioversion, please notify our office immediately. If your procedure is canceled in the cardioversion suite you will be charged a cancellation fee. ? ?

## 2022-03-06 NOTE — H&P (View-Only) (Signed)
? ?Primary Care Physician: Deland Pretty, MD ?Referring Physician: Dr. Claiborne Billings ? ? ?Nathan Mcclure is a 71 y.o. male with a h/o afib that had been quiet on amiodarone for years. One year ago because of abstinence of afib, amio was reduced to 100 mg daily. He recently returned to afib in the last 1-2 days. He took a whole amio 200 mg tab this am. He is rate controlled. No recent illness or change in lifestyle. He is not using cpap. No alcohol, 2-3 cups of coffee daily. No tobacco use but he has started drinking 2 energy drinks a day. He is on xarelto 20 mg daily.  ? ?F/u in afib clinic. He continues to be in afib rate controlled. He is reloading on amio 200 mg 2x a day and we discussed pursing cardioversion in a couple of weeks, he is in agreement. He is using cpap now and has stopped energy drinks. No missed xarelto.  ? ?Today, he denies symptoms of palpitations, chest pain, shortness of breath, orthopnea, PND, lower extremity edema, dizziness, presyncope, syncope, or neurologic sequela. The patient is tolerating medications without difficulties and is otherwise without complaint today.  ? ?Past Medical History:  ?Diagnosis Date  ? Anxiety   ? Arthritis   ? Atrial fibrillation ([place])   ? Benign essential tremor   ? no meds taken  ? Coronary artery disease   ? history of Atrial Fib.  ? Depression   ? GERD (gastroesophageal reflux disease)   ? History of kidney stones   ? Hyperlipidemia   ? Hypertension   ? Low testosterone   ? Palpitations   ? Prostate cancer ([place])   ? Rheumatoid arthritis ([place])   ? Sleep apnea   ? has CPAP but not worn in 6 months  ? ?Past Surgical History:  ?Procedure Laterality Date  ? BACK SURGERY  2010  ? lower  ? CARDIAC CATHETERIZATION  05/14/2004  ? Contiued medical therapy  ? CARDIOVASCULAR STRESS TEST  09/19/2009  ? Observed defect is consistent with diaphragmatic attentuation. EKG negative for ischemia.  ? CARDIOVERSION N/A 11/20/2014  ? Procedure: CARDIOVERSION;  Surgeon: Troy Sine,  MD;  Location: [place];  Service: Cardiovascular;  Laterality: N/A;  ? CARDIOVERSION N/A 02/19/2016  ? Procedure: CARDIOVERSION;  Surgeon: Troy Sine, MD;  Location: [place];  Service: Cardiovascular;  Laterality: N/A;  ? CERVICAL FUSION  2005  ? CYSTOSCOPY N/A 10/25/2020  ? Procedure: CYSTOSCOPY;  Surgeon: Irine Seal, MD;  Location: [facility];  Service: Urology;  Laterality: N/A;  ? EXTRACORPOREAL SHOCK WAVE LITHOTRIPSY Right 05/09/2019  ? Procedure: EXTRACORPOREAL SHOCK WAVE LITHOTRIPSY (ESWL);  Surgeon: Festus Aloe, MD;  Location: [facility];  Service: Urology;  Laterality: Right;  ? [place], 2007  ? stent insertion, stone basket   ? PROSTATE BIOPSY  2021  ? RADIOACTIVE SEED IMPLANT N/A 10/25/2020  ? Procedure: RADIOACTIVE SEED IMPLANT/BRACHYTHERAPY IMPLANT;  Surgeon: Irine Seal, MD;  Location: [facility];  Service: Urology;  Laterality: N/A;  ? RESECTION DISTAL CLAVICAL Right 07/06/2019  ? Procedure: OPEN DISTAL CLAVICLE EXCISION;  Surgeon: Earlie Server, MD;  Location: [place];  Service: Orthopedics;  Laterality: Right;  ? SHOULDER ARTHROSCOPY WITH ROTATOR CUFF REPAIR AND SUBACROMIAL DECOMPRESSION Right 07/06/2019  ? Procedure: SHOULDER ARTHROSCOPY WITH OPEN ROTATOR CUFF REPAIR, SUBACROMIAL DECOMPRESSION WITH EXTENSIVE DEBRIDEMENT;  Surgeon: Earlie Server, MD;  Location: [place];  Service: Orthopedics;  Laterality: Right;  ? SHOULDER  ARTHROSCOPY WITH ROTATOR CUFF REPAIR AND SUBACROMIAL DECOMPRESSION Left 04/24/2021  ? Procedure: SHOULDER ARTHROSCOPY WITH  SUBACROMIAL DECOMPRESSION;  Surgeon: Earlie Server, MD;  Location: [place];  Service: Orthopedics;  Laterality: Left;  ? SHOULDER OPEN ROTATOR CUFF REPAIR Left 04/24/2021  ? Procedure: OPEN ROTATOR CUFF REPAIR SHOULDER DISTAL CLAVICLE RESECTION;  Surgeon: Earlie Server, MD;  Location: [place];  Service: Orthopedics;   Laterality: Left;  ? SPACE OAR INSTILLATION N/A 10/25/2020  ? Procedure: SPACE OAR INSTILLATION;  Surgeon: Irine Seal, MD;  Location: [facility];  Service: Urology;  Laterality: N/A;  ? TONSILLECTOMY    ? as a child  ? TRANSTHORACIC ECHOCARDIOGRAM  03/29/2008  ? EF 67%, mild mitral and tricuspid valve regurg.  ? ? ?Current Outpatient Medications  ?Medication Sig Dispense Refill  ? amiodarone (PACERONE) 200 MG tablet Take 1 tablet by mouth twice a day for the next month then reduce to 1 tablet daily 60 tablet 0  ? clobetasol cream (TEMOVATE) 0.05 %     ? ezetimibe (ZETIA) 10 MG tablet Take 1 tablet (10 mg total) by mouth daily. 90 tablet 1  ? fexofenadine (ALLEGRA) 180 MG tablet Take 180 mg by mouth at bedtime.     ? fluticasone (FLONASE) 50 MCG/ACT nasal spray Place into both nostrils as needed.    ? lansoprazole (PREVACID) 15 MG capsule Take 15 mg by mouth at bedtime.    ? losartan (COZAAR) 50 MG tablet TAKE 1 TABLET TWICE A DAY 180 tablet 3  ? oxyCODONE (OXY IR/ROXICODONE) 5 MG immediate release tablet Take one tab po q4-6hrs prn pain, may need 1-2 first couple days 40 tablet 0  ? rosuvastatin (CRESTOR) 20 MG tablet TAKE 1 TABLET DAILY 30 tablet 0  ? sertraline (ZOLOFT) 100 MG tablet Take 100 mg by mouth daily.    ? tamsulosin (FLOMAX) 0.4 MG CAPS capsule Take 1 capsule (0.4 mg total) by mouth daily. 90 capsule 3  ? XARELTO 20 MG TABS tablet TAKE 1 TABLET DAILY WITH SUPPER 90 tablet 3  ? ?No current facility-administered medications for this encounter.  ? ? ?Allergies  ?Allergen Reactions  ? Penicillins   ?  Childhood allergy  ? ? ?Social History  ? ?Socioeconomic History  ? Marital status: Widowed  ?  Spouse name: Not on file  ? Number of children: Not on file  ? Years of education: Not on file  ? Highest education level: Not on file  ?Occupational History  ? Occupation: retired  ?  Comment: [employer]  ?Tobacco Use  ? Smoking status: Former  ?  Packs/day: 0.25  ?  Years: 40.00  ?  Pack years: 10.00  ?   Types: Cigarettes  ?  Quit date: 01/25/2004  ?  Years since quitting: 18.1  ? Smokeless tobacco: Never  ?Vaping Use  ? Vaping Use: Never used  ?Substance and Sexual Activity  ? Alcohol use: Not Currently  ?  Alcohol/week: 4.0 standard drinks  ?  Types: 4 Standard drinks or equivalent per week  ?  Comment: couple times a month  ? Drug use: No  ? Sexual activity: Not Currently  ?Other Topics Concern  ? Not on file  ?Social History Narrative  ? One of nine siblings (6 girls and 3 boys). Lost wife to cancer of the sinus approximately ten years ago (2011).   ? ?Social Determinants of Health  ? ?Financial Resource Strain: Not on file  ?Food Insecurity: Not on file  ?  Transportation Needs: Not on file  ?Physical Activity: Not on file  ?Stress: Not on file  ?Social Connections: Not on file  ?Intimate Partner Violence: Not on file  ? ? ?Family History  ?Problem Relation Age of Onset  ? Diabetes Mother   ? Kidney failure Mother   ? Heart disease Mother   ? Prostate cancer Father   ? Alzheimer's disease Father   ? Brain cancer Sister   ? Lung cancer Brother   ? Heart disease Sister   ? Tremor Daughter   ? Alopecia Daughter   ? ? ?ROS- All systems are reviewed and negative except as per the HPI above ? ?Physical Exam: ?Vitals:  ? 03/06/22 0916  ?BP: 132/84  ?Pulse: 83  ?Weight: 103.8 kg  ?Height: '6\' 1"'$  (1.854 m)  ? ?Wt Readings from Last 3 Encounters:  ?03/06/22 103.8 kg  ?02/26/22 106.7 kg  ?04/24/21 98.2 kg  ? ? ?Labs: ?Lab Results  ?Component Value Date  ? NA 144 10/22/2020  ? K 4.0 10/22/2020  ? CL 109 10/22/2020  ? CO2 25 10/22/2020  ? GLUCOSE 104 (H) 10/22/2020  ? BUN 23 10/22/2020  ? CREATININE 0.93 10/22/2020  ? CALCIUM 8.8 (L) 10/22/2020  ? MG 1.7 12/10/2015  ? ?Lab Results  ?Component Value Date  ? INR 1.0 10/22/2020  ? ?Lab Results  ?Component Value Date  ? CHOL 258 (H) 10/22/2020  ? HDL 45 10/22/2020  ? LDLCALC 196 (H) 10/22/2020  ? TRIG 84 10/22/2020  ? ? ? ?GEN- The patient is well appearing, alert and oriented x 3  today.   ?Head- normocephalic, atraumatic ?Eyes-  Sclera clear, conjunctiva pink ?Ears- hearing intact ?Oropharynx- clear ?Neck- supple, no JVP ?Lymph- no cervical lymphadenopathy ?Lungs- Clear to ausculatio

## 2022-03-18 ENCOUNTER — Encounter (HOSPITAL_COMMUNITY): Payer: Self-pay | Admitting: Internal Medicine

## 2022-03-18 NOTE — Progress Notes (Signed)
Attempted to obtain medical history via telephone, unable to reach at this time. I left a voicemail to return pre surgical testing department's phone call.  

## 2022-03-19 ENCOUNTER — Other Ambulatory Visit: Payer: Self-pay

## 2022-03-19 MED ORDER — EZETIMIBE 10 MG PO TABS: 10.0000 mg | ORAL_TABLET | Freq: Every day | ORAL | 0 refills | Status: DC

## 2022-03-20 ENCOUNTER — Telehealth: Payer: Self-pay | Admitting: Cardiovascular Disease

## 2022-03-20 MED ORDER — AMIODARONE HCL 200 MG PO TABS: ORAL_TABLET | ORAL | 0 refills | Status: DC

## 2022-03-20 MED ORDER — ROSUVASTATIN CALCIUM 20 MG PO TABS: 20.0000 mg | ORAL_TABLET | Freq: Every day | ORAL | 0 refills | Status: DC

## 2022-03-20 MED ORDER — LOSARTAN POTASSIUM 50 MG PO TABS: 50.0000 mg | ORAL_TABLET | Freq: Two times a day (BID) | ORAL | 0 refills | Status: DC

## 2022-03-20 NOTE — Telephone Encounter (Signed)
Refills sent to [facility]. ?

## 2022-03-20 NOTE — Telephone Encounter (Signed)
?*  STAT* If patient is at the pharmacy, call can be transferred to refill team. ? ? ?1. Which medications need to be refilled? (please list name of each medication and dose if known)  ? ?amiodarone (PACERONE) 200 MG tablet 30 ds ? ?rosuvastatin (CRESTOR) 20 MG tablet   30ds ? ?losartan (COZAAR) 50 MG tablet  ?90 ds ? ?2. Which pharmacy/location (including street and city if local pharmacy) is medication to be sent to? ? ?[place], [place] ?P: [phone] ?F: [phone] ? ?3. Do they need a 30 day or 90 day supply? Listed above ? ?

## 2022-03-25 ENCOUNTER — Other Ambulatory Visit (HOSPITAL_COMMUNITY): Payer: Self-pay | Admitting: Nurse Practitioner

## 2022-03-26 ENCOUNTER — Encounter (HOSPITAL_COMMUNITY)
Admission: RE | Disposition: A | Discharge status: Home / Self Care | Payer: Self-pay | Source: Ambulatory Visit | Attending: Internal Medicine

## 2022-03-26 ENCOUNTER — Ambulatory Visit (HOSPITAL_COMMUNITY)
Admission: RE | Admit: 2022-03-26 | Discharge: 2022-03-26 | Disposition: A | Discharge status: Home / Self Care | Payer: Medicare HMO | Source: Ambulatory Visit | Attending: Nurse Practitioner | Admitting: Nurse Practitioner

## 2022-03-26 ENCOUNTER — Ambulatory Visit (HOSPITAL_BASED_OUTPATIENT_CLINIC_OR_DEPARTMENT_OTHER): Payer: Medicare HMO | Admitting: Anesthesiology

## 2022-03-26 ENCOUNTER — Encounter (HOSPITAL_COMMUNITY): Payer: Self-pay | Admitting: Internal Medicine

## 2022-03-26 ENCOUNTER — Ambulatory Visit (HOSPITAL_COMMUNITY): Payer: Medicare HMO | Admitting: Anesthesiology

## 2022-03-26 ENCOUNTER — Ambulatory Visit (HOSPITAL_COMMUNITY)
Admission: RE | Admit: 2022-03-26 | Discharge: 2022-03-26 | Disposition: A | Discharge status: Home / Self Care | Payer: Medicare HMO | Source: Ambulatory Visit | Attending: Internal Medicine | Admitting: Internal Medicine

## 2022-03-26 DIAGNOSIS — I2510 Atherosclerotic heart disease of native coronary artery without angina pectoris: Secondary | ICD-10-CM | POA: Diagnosis not present

## 2022-03-26 DIAGNOSIS — Z9989 Dependence on other enabling machines and devices: Secondary | ICD-10-CM

## 2022-03-26 DIAGNOSIS — I4891 Unspecified atrial fibrillation: Secondary | ICD-10-CM | POA: Diagnosis not present

## 2022-03-26 DIAGNOSIS — M069 Rheumatoid arthritis, unspecified: Secondary | ICD-10-CM | POA: Insufficient documentation

## 2022-03-26 DIAGNOSIS — G4730 Sleep apnea, unspecified: Secondary | ICD-10-CM | POA: Diagnosis not present

## 2022-03-26 DIAGNOSIS — Z79899 Other long term (current) drug therapy: Secondary | ICD-10-CM | POA: Diagnosis not present

## 2022-03-26 DIAGNOSIS — I10 Essential (primary) hypertension: Secondary | ICD-10-CM

## 2022-03-26 DIAGNOSIS — G4733 Obstructive sleep apnea (adult) (pediatric): Secondary | ICD-10-CM | POA: Diagnosis not present

## 2022-03-26 DIAGNOSIS — Z87891 Personal history of nicotine dependence: Secondary | ICD-10-CM | POA: Diagnosis not present

## 2022-03-26 DIAGNOSIS — Z7901 Long term (current) use of anticoagulants: Secondary | ICD-10-CM | POA: Insufficient documentation

## 2022-03-26 DIAGNOSIS — E785 Hyperlipidemia, unspecified: Secondary | ICD-10-CM | POA: Insufficient documentation

## 2022-03-26 HISTORY — PX: CARDIOVERSION: SHX1299

## 2022-03-26 LAB — BASIC METABOLIC PANEL
Anion gap: 7 (ref 5–15)
BUN: 19 mg/dL (ref 8–23)
CO2: 26 mmol/L (ref 22–32)
Calcium: 8.9 mg/dL (ref 8.9–10.3)
Chloride: 110 mmol/L (ref 98–111)
Creatinine, Ser: 1.17 mg/dL (ref 0.61–1.24)
GFR, Estimated: >60 mL/min (ref >60)
Glucose, Bld: 109 mg/dL — ABNORMAL HIGH (ref 70–99)
Potassium: 4.3 mmol/L (ref 3.5–5.1)
Sodium: 143 mmol/L (ref 135–145)

## 2022-03-26 LAB — CBC
HCT: 41.9 % (ref 39.0–52.0)
Hemoglobin: 12.9 g/dL — ABNORMAL LOW (ref 13.0–17.0)
MCH: 28.9 pg (ref 26.0–34.0)
MCHC: 30.8 g/dL (ref 30.0–36.0)
MCV: 93.7 fL (ref 80.0–100.0)
Platelets: 265 10*3/uL (ref 150–400)
RBC: 4.47 MIL/uL (ref 4.22–5.81)
RDW: 13.6 % (ref 11.5–15.5)
WBC: 7.6 10*3/uL (ref 4.0–10.5)
nRBC: 0 % (ref 0.0–0.2)

## 2022-03-26 SURGERY — CARDIOVERSION
Anesthesia: General

## 2022-03-26 MED ORDER — PROPOFOL 10 MG/ML IV BOLUS
INTRAVENOUS | Status: DC | PRN
  Administered 2022-03-26: 100 mg via INTRAVENOUS

## 2022-03-26 MED ORDER — SODIUM CHLORIDE 0.9 % IV SOLN: INTRAVENOUS | Status: DC

## 2022-03-26 MED ORDER — LIDOCAINE 2% (20 MG/ML) 5 ML SYRINGE
INTRAMUSCULAR | Status: DC | PRN
  Administered 2022-03-26: 40 mg via INTRAVENOUS

## 2022-03-26 NOTE — Anesthesia Preprocedure Evaluation (Signed)
Anesthesia Evaluation  ?Patient identified by MRN, date of birth, ID band ?Patient awake ? ? ? ?Reviewed: ?Allergy & Precautions, NPO status , Patient's Chart, lab work & pertinent test results ? ?History of Anesthesia Complications ?Negative for: history of anesthetic complications ? ?Airway ?Mallampati: I ? ?TM Distance: >3 FB ?Neck ROM: Full ? ? ? Dental ? ?(+) Dental Advisory Given ?  ?Pulmonary ?sleep apnea and Continuous Positive Airway Pressure Ventilation , former smoker,  ?  ?breath sounds clear to auscultation ? ? ? ? ? ? Cardiovascular ?hypertension, + dysrhythmias Atrial Fibrillation  ?Rhythm:Irregular Rate:Normal ? ?'15 ECHO: EF 50-55%.  ?Mitral valve: Mild to moderate regurgitation ? ?  ?Neuro/Psych ?Anxiety Depression   ? GI/Hepatic ?Neg liver ROS, GERD  Medicated and Controlled,  ?Endo/Other  ?negative endocrine ROS ? Renal/GU ?negative Renal ROS  ? ?  ?Musculoskeletal ? ? Abdominal ?  ?Peds ? Hematology ?xarelto   ?Anesthesia Other Findings ? ? Reproductive/Obstetrics ? ?  ? ? ? ? ? ? ? ? ? ? ? ? ? ?  ?  ? ? ? ? ? ? ? ? ?Anesthesia Physical ?Anesthesia Plan ? ?ASA: 3 ? ?Anesthesia Plan: General  ? ?Post-op Pain Management: Minimal or no pain anticipated  ? ?Induction: Intravenous ? ?PONV Risk Score and Plan: 2 and Treatment may vary due to age or medical condition ? ?Airway Management Planned: Natural Airway and Mask ? ?Additional Equipment: None ? ?Intra-op Plan:  ? ?Post-operative Plan:  ? ?Informed Consent: I have reviewed the patients History and Physical, chart, labs and discussed the procedure including the risks, benefits and alternatives for the proposed anesthesia with the patient or authorized representative who has indicated his/her understanding and acceptance.  ? ? ? ?Dental advisory given ? ?Plan Discussed with: CRNA and Surgeon ? ?Anesthesia Plan Comments:   ? ? ? ? ? ? ?Anesthesia Quick Evaluation ? ?

## 2022-03-26 NOTE — CV Procedure (Signed)
Procedure: Electrical Cardioversion ?Indications:  Atrial Fibrillation ? ?Procedure Details: ? ?Consent: Risks of procedure as well as the alternatives and risks of each were explained to the (patient/caregiver).  Consent for procedure obtained. ? ?Time Out: Verified patient identification, verified procedure, site/side was marked, verified correct patient position, special equipment/implants available, medications/allergies/relevent history reviewed, required imaging and test results available. PERFORMED. ? ?Patient placed on cardiac monitor, pulse oximetry, supplemental oxygen as necessary.  ?Sedation given:  Propofol ?Pacer pads placed anterior and posterior chest. ? ?Cardioverted 1 time(s).  ?Cardioversion with synchronized biphasic 200J shock. ? ?Evaluation: ?Findings: Post procedure EKG shows: NSR ?Complications: None ?Patient did tolerate procedure well. ? ?Time Spent Directly with the Patient: ? ?20 minutes  ? ?Phineas Inches E ?03/26/2022, 11:06 AM ? ?

## 2022-03-26 NOTE — Anesthesia Postprocedure Evaluation (Signed)
Anesthesia Post Note ? ?Patient: Nathan Mcclure ? ?Procedure(s) Performed: CARDIOVERSION ? ?  ? ?Patient location during evaluation: Endoscopy ?Anesthesia Type: General ?Level of consciousness: awake and alert, oriented and patient cooperative ?Pain management: pain level controlled ?Vital Signs Assessment: post-procedure vital signs reviewed and stable ?Respiratory status: spontaneous breathing, nonlabored ventilation and respiratory function stable ?Cardiovascular status: blood pressure returned to baseline and stable ?Postop Assessment: no apparent nausea or vomiting and able to ambulate ?Anesthetic complications: no ? ? ?No notable events documented. ? ?Last Vitals:  ?Vitals:  ? 03/26/22 1119 03/26/22 1129  ?BP: 125/69 131/69  ?Pulse: 67 64  ?Resp: 19 15  ?Temp:    ?SpO2: 97% 95%  ?  ?Last Pain:  ?Vitals:  ? 03/26/22 1129  ?TempSrc:   ?PainSc: 0-No pain  ? ? ?  ?  ?  ?  ?  ?  ? ?Dayelin Balducci,E. Chevis Weisensel ? ? ? ? ?

## 2022-03-26 NOTE — Interval H&P Note (Signed)
History and Physical Interval Note: ? ?03/26/2022 ?10:28 AM ? ?Nathan Mcclure  has presented today for surgery, with the diagnosis of AFIB.  The various methods of treatment have been discussed with the patient and family. After consideration of risks, benefits and other options for treatment, the patient has consented to  Procedure(s): ?CARDIOVERSION (N/A) as a surgical intervention.  The patient's history has been reviewed, patient examined, no change in status, stable for surgery.  I have reviewed the patient's chart and labs.  Questions were answered to the patient's satisfaction.   ? ? ?Phineas Inches E ? ? ?

## 2022-03-26 NOTE — Transfer of Care (Signed)
Immediate Anesthesia Transfer of Care Note ? ?Patient: Nathan Mcclure ? ?Procedure(s) Performed: CARDIOVERSION ? ?Patient Location: PACU ? ?Anesthesia Type:MAC ? ?Level of Consciousness: drowsy ? ?Airway & Oxygen Therapy: Patient Spontanous Breathing ? ?Post-op Assessment: Report given to RN and Post -op Vital signs reviewed and stable ? ?Post vital signs: Reviewed and stable ? ?Last Vitals:  ?Vitals Value Taken Time  ?BP    ?Temp    ?Pulse    ?Resp    ?SpO2    ? ? ?Last Pain:  ?Vitals:  ? 03/26/22 0900  ?TempSrc: Temporal  ?PainSc: 0-No pain  ?   ? ?  ? ?Complications: No notable events documented. ?

## 2022-03-26 NOTE — Discharge Instructions (Signed)

## 2022-03-27 ENCOUNTER — Encounter (HOSPITAL_COMMUNITY): Payer: Self-pay | Admitting: Internal Medicine

## 2022-03-31 DIAGNOSIS — M1712 Unilateral primary osteoarthritis, left knee: Secondary | ICD-10-CM | POA: Diagnosis not present

## 2022-04-03 ENCOUNTER — Encounter (HOSPITAL_COMMUNITY): Payer: Self-pay

## 2022-04-03 ENCOUNTER — Ambulatory Visit (HOSPITAL_COMMUNITY): Payer: Medicare HMO | Admitting: Nurse Practitioner

## 2022-04-07 DIAGNOSIS — M1712 Unilateral primary osteoarthritis, left knee: Secondary | ICD-10-CM | POA: Diagnosis not present

## 2022-04-07 DIAGNOSIS — Z23 Encounter for immunization: Secondary | ICD-10-CM | POA: Diagnosis not present

## 2022-04-10 ENCOUNTER — Ambulatory Visit (HOSPITAL_COMMUNITY)
Admission: RE | Admit: 2022-04-10 | Discharge: 2022-04-10 | Disposition: A | Discharge status: Home / Self Care | Payer: Medicare HMO | Source: Ambulatory Visit | Attending: Nurse Practitioner | Admitting: Nurse Practitioner

## 2022-04-10 ENCOUNTER — Encounter (HOSPITAL_COMMUNITY): Payer: Self-pay | Admitting: Nurse Practitioner

## 2022-04-10 VITALS — BP 164/84 | HR 48 | Ht 73.0 in | Wt 230.0 lb

## 2022-04-10 DIAGNOSIS — D6869 Other thrombophilia: Secondary | ICD-10-CM | POA: Diagnosis not present

## 2022-04-10 DIAGNOSIS — Z7901 Long term (current) use of anticoagulants: Secondary | ICD-10-CM | POA: Diagnosis not present

## 2022-04-10 DIAGNOSIS — I480 Paroxysmal atrial fibrillation: Secondary | ICD-10-CM | POA: Diagnosis not present

## 2022-04-10 MED ORDER — LOSARTAN POTASSIUM 50 MG PO TABS: 50.0000 mg | ORAL_TABLET | Freq: Two times a day (BID) | ORAL | Status: DC

## 2022-04-10 MED ORDER — AMIODARONE HCL 200 MG PO TABS: ORAL_TABLET | ORAL | Status: DC

## 2022-04-10 NOTE — Progress Notes (Signed)
? ?Primary Care Physician: Deland Pretty, MD ?Referring Physician: Dr. Claiborne Billings ? ? ?Nathan Mcclure is a 71 y.o. male with a h/o afib that had been quiet on amiodarone for years. One year ago because of abstinence of afib, amio was reduced to 100 mg daily. He recently returned to afib in the last 1-2 days. He took a whole amio 200 mg tab this am. He is rate controlled. No recent illness or change in lifestyle. He is not using cpap. No alcohol, 2-3 cups of coffee daily. No tobacco use but he has started drinking 2 energy drinks a day. He is on xarelto 20 mg daily.  ? ?F/u in afib clinic. He continues to be in afib rate controlled. He is reloading on amio 200 mg 2x a day and we discussed pursing cardioversion in a couple of weeks, he is in agreement. He is using cpap now and has stopped energy drinks. No missed xarelto.  ? ?F/u 04/10/22. He had a successful cardioversion and remains in a sinus brady at 48 bpm. He feels tired often, he is using cpap. I question if the amio/brady is making him feel sluggish. We discussed an ablation today as a means to stop amio and to allow HR to drift upward. He is not totally convinced he wants to go thru with it but wants to discuss with Dr. Claiborne Billings on appointment  6/6 and I will ask for appointment with EP for him to further discuss. Echo will need to be updated.  ? ?Today, he denies symptoms of palpitations, chest pain, shortness of breath, orthopnea, PND, lower extremity edema, dizziness, presyncope, syncope, or neurologic sequela. The patient is tolerating medications without difficulties and is otherwise without complaint today.  ? ?Past Medical History:  ?Diagnosis Date  ? Anxiety   ? Arthritis   ? Atrial fibrillation ([place])   ? Benign essential tremor   ? no meds taken  ? Coronary artery disease   ? history of Atrial Fib.  ? Depression   ? GERD (gastroesophageal reflux disease)   ? History of kidney stones   ? Hyperlipidemia   ? Hypertension   ? Low testosterone   ? Palpitations    ? Prostate cancer ([place])   ? Rheumatoid arthritis ([place])   ? Sleep apnea   ? has CPAP but not worn in 6 months  ? ?Past Surgical History:  ?Procedure Laterality Date  ? BACK SURGERY  2010  ? lower  ? CARDIAC CATHETERIZATION  05/14/2004  ? Contiued medical therapy  ? CARDIOVASCULAR STRESS TEST  09/19/2009  ? Observed defect is consistent with diaphragmatic attentuation. EKG negative for ischemia.  ? CARDIOVERSION N/A 11/20/2014  ? Procedure: CARDIOVERSION;  Surgeon: Troy Sine, MD;  Location: [place];  Service: Cardiovascular;  Laterality: N/A;  ? CARDIOVERSION N/A 02/19/2016  ? Procedure: CARDIOVERSION;  Surgeon: Troy Sine, MD;  Location: [place];  Service: Cardiovascular;  Laterality: N/A;  ? CARDIOVERSION N/A 03/26/2022  ? Procedure: CARDIOVERSION;  Surgeon: Janina Mayo, MD;  Location: [place];  Service: Cardiovascular;  Laterality: N/A;  ? CERVICAL FUSION  2005  ? CYSTOSCOPY N/A 10/25/2020  ? Procedure: CYSTOSCOPY;  Surgeon: Irine Seal, MD;  Location: [facility];  Service: Urology;  Laterality: N/A;  ? EXTRACORPOREAL SHOCK WAVE LITHOTRIPSY Right 05/09/2019  ? Procedure: EXTRACORPOREAL SHOCK WAVE LITHOTRIPSY (ESWL);  Surgeon: Festus Aloe, MD;  Location: [facility];  Service: Urology;  Laterality: Right;  ? [place], 2007  ?  stent insertion, stone basket   ? PROSTATE BIOPSY  2021  ? RADIOACTIVE SEED IMPLANT N/A 10/25/2020  ? Procedure: RADIOACTIVE SEED IMPLANT/BRACHYTHERAPY IMPLANT;  Surgeon: Irine Seal, MD;  Location: [facility];  Service: Urology;  Laterality: N/A;  ? RESECTION DISTAL CLAVICAL Right 07/06/2019  ? Procedure: OPEN DISTAL CLAVICLE EXCISION;  Surgeon: Earlie Server, MD;  Location: [place];  Service: Orthopedics;  Laterality: Right;  ? SHOULDER ARTHROSCOPY WITH ROTATOR CUFF REPAIR AND SUBACROMIAL DECOMPRESSION Right 07/06/2019  ? Procedure: SHOULDER ARTHROSCOPY WITH OPEN ROTATOR CUFF REPAIR, SUBACROMIAL  DECOMPRESSION WITH EXTENSIVE DEBRIDEMENT;  Surgeon: Earlie Server, MD;  Location: [place];  Service: Orthopedics;  Laterality: Right;  ? SHOULDER ARTHROSCOPY WITH ROTATOR CUFF REPAIR AND SUBACROMIAL DECOMPRESSION Left 04/24/2021  ? Procedure: SHOULDER ARTHROSCOPY WITH  SUBACROMIAL DECOMPRESSION;  Surgeon: Earlie Server, MD;  Location: [place];  Service: Orthopedics;  Laterality: Left;  ? SHOULDER OPEN ROTATOR CUFF REPAIR Left 04/24/2021  ? Procedure: OPEN ROTATOR CUFF REPAIR SHOULDER DISTAL CLAVICLE RESECTION;  Surgeon: Earlie Server, MD;  Location: [place];  Service: Orthopedics;  Laterality: Left;  ? SPACE OAR INSTILLATION N/A 10/25/2020  ? Procedure: SPACE OAR INSTILLATION;  Surgeon: Irine Seal, MD;  Location: [facility];  Service: Urology;  Laterality: N/A;  ? TONSILLECTOMY    ? as a child  ? TRANSTHORACIC ECHOCARDIOGRAM  03/29/2008  ? EF 67%, mild mitral and tricuspid valve regurg.  ? ? ?Current Outpatient Medications  ?Medication Sig Dispense Refill  ? ezetimibe (ZETIA) 10 MG tablet Take 1 tablet (10 mg total) by mouth daily. MUST KEEP APPOINTMENT FOR FUTURE REFILLS. 90 tablet 0  ? fexofenadine (ALLEGRA) 180 MG tablet Take 180 mg by mouth at bedtime.     ? fluticasone (FLONASE) 50 MCG/ACT nasal spray Place 1 spray into both nostrils daily.    ? lansoprazole (PREVACID) 15 MG capsule Take 15 mg by mouth at bedtime.    ? oxyCODONE (OXY IR/ROXICODONE) 5 MG immediate release tablet Take one tab po q4-6hrs prn pain, may need 1-2 first couple days 40 tablet 0  ? rosuvastatin (CRESTOR) 20 MG tablet Take 1 tablet (20 mg total) by mouth daily. 30 tablet 0  ? sertraline (ZOLOFT) 100 MG tablet Take 100 mg by mouth daily.    ? tamsulosin (FLOMAX) 0.4 MG CAPS capsule Take 1 capsule (0.4 mg total) by mouth daily. 90 capsule 3  ? XARELTO 20 MG TABS tablet TAKE 1 TABLET DAILY WITH SUPPER 90 tablet 3  ? amiodarone (PACERONE) 200 MG tablet Taking one tablet by  mouth daily    ? losartan (COZAAR) 50 MG tablet Take 1 tablet (50 mg total) by mouth 2 (two) times daily.    ? ?No current facility-administered medications for this encounter.  ? ? ?Allergies  ?Allergen Reactions  ? Penicillins   ?  Childhood allergy  ? ? ?Social History  ? ?Socioeconomic History  ? Marital status: Widowed  ?  Spouse name: Not on file  ? Number of children: Not on file  ? Years of education: Not on file  ? Highest education level: Not on file  ?Occupational History  ? Occupation: retired  ?  Comment: [employer]  ?Tobacco Use  ? Smoking status: Former  ?  Packs/day: 0.25  ?  Years: 40.00  ?  Pack years: 10.00  ?  Types: Cigarettes  ?  Quit date: 01/25/2004  ?  Years since quitting: 18.2  ? Smokeless tobacco:  Never  ?Vaping Use  ? Vaping Use: Never used  ?Substance and Sexual Activity  ? Alcohol use: Not Currently  ?  Alcohol/week: 4.0 standard drinks  ?  Types: 4 Standard drinks or equivalent per week  ?  Comment: couple times a month  ? Drug use: No  ? Sexual activity: Not Currently  ?Other Topics Concern  ? Not on file  ?Social History Narrative  ? One of nine siblings (6 girls and 3 boys). Lost wife to cancer of the sinus approximately ten years ago (2011).   ? ?Social Determinants of Health  ? ?Financial Resource Strain: Not on file  ?Food Insecurity: Not on file  ?Transportation Needs: Not on file  ?Physical Activity: Not on file  ?Stress: Not on file  ?Social Connections: Not on file  ?Intimate Partner Violence: Not on file  ? ? ?Family History  ?Problem Relation Age of Onset  ? Diabetes Mother   ? Kidney failure Mother   ? Heart disease Mother   ? Prostate cancer Father   ? Alzheimer's disease Father   ? Brain cancer Sister   ? Lung cancer Brother   ? Heart disease Sister   ? Tremor Daughter   ? Alopecia Daughter   ? ? ?ROS- All systems are reviewed and negative except as per the HPI above ? ?Physical Exam: ?Vitals:  ? 04/10/22 1022  ?BP: (!) 164/84  ?Pulse: (!) 48  ?Weight: 104.3 kg  ?Height: 6'  1" (1.854 m)  ? ?Wt Readings from Last 3 Encounters:  ?04/10/22 104.3 kg  ?03/26/22 102.5 kg  ?03/06/22 103.8 kg  ? ? ?Labs: ?Lab Results  ?Component Value Date  ? NA 143 03/26/2022  ? K 4.3 03/26/2022  ? CL

## 2022-04-12 ENCOUNTER — Other Ambulatory Visit: Payer: Self-pay | Admitting: Cardiovascular Disease

## 2022-04-14 DIAGNOSIS — M4714 Other spondylosis with myelopathy, thoracic region: Secondary | ICD-10-CM | POA: Diagnosis not present

## 2022-04-14 DIAGNOSIS — M1712 Unilateral primary osteoarthritis, left knee: Secondary | ICD-10-CM | POA: Diagnosis not present

## 2022-04-14 DIAGNOSIS — M25571 Pain in right ankle and joints of right foot: Secondary | ICD-10-CM | POA: Diagnosis not present

## 2022-04-14 DIAGNOSIS — M4712 Other spondylosis with myelopathy, cervical region: Secondary | ICD-10-CM | POA: Diagnosis not present

## 2022-04-14 DIAGNOSIS — G894 Chronic pain syndrome: Secondary | ICD-10-CM | POA: Diagnosis not present

## 2022-04-16 DIAGNOSIS — M79671 Pain in right foot: Secondary | ICD-10-CM | POA: Diagnosis not present

## 2022-04-21 DIAGNOSIS — Z87442 Personal history of urinary calculi: Secondary | ICD-10-CM | POA: Diagnosis not present

## 2022-04-21 DIAGNOSIS — N401 Enlarged prostate with lower urinary tract symptoms: Secondary | ICD-10-CM | POA: Diagnosis not present

## 2022-04-21 DIAGNOSIS — R3121 Asymptomatic microscopic hematuria: Secondary | ICD-10-CM | POA: Diagnosis not present

## 2022-04-21 DIAGNOSIS — R3915 Urgency of urination: Secondary | ICD-10-CM | POA: Diagnosis not present

## 2022-04-21 DIAGNOSIS — Z8546 Personal history of malignant neoplasm of prostate: Secondary | ICD-10-CM | POA: Diagnosis not present

## 2022-04-24 ENCOUNTER — Other Ambulatory Visit: Payer: Self-pay

## 2022-04-28 ENCOUNTER — Encounter (HOSPITAL_COMMUNITY): Payer: Self-pay

## 2022-04-28 ENCOUNTER — Ambulatory Visit (HOSPITAL_COMMUNITY): Payer: Medicare HMO

## 2022-05-21 ENCOUNTER — Ambulatory Visit (HOSPITAL_COMMUNITY)
Admission: RE | Admit: 2022-05-21 | Discharge: 2022-05-21 | Disposition: A | Discharge status: Home / Self Care | Payer: Medicare HMO | Source: Ambulatory Visit | Attending: Nurse Practitioner | Admitting: Nurse Practitioner

## 2022-05-21 DIAGNOSIS — I480 Paroxysmal atrial fibrillation: Secondary | ICD-10-CM | POA: Diagnosis not present

## 2022-05-21 DIAGNOSIS — I2510 Atherosclerotic heart disease of native coronary artery without angina pectoris: Secondary | ICD-10-CM | POA: Diagnosis not present

## 2022-05-21 DIAGNOSIS — I77810 Thoracic aortic ectasia: Secondary | ICD-10-CM | POA: Diagnosis not present

## 2022-05-21 DIAGNOSIS — G4730 Sleep apnea, unspecified: Secondary | ICD-10-CM | POA: Insufficient documentation

## 2022-05-21 LAB — ECHOCARDIOGRAM COMPLETE
AR max vel: 3.2 cm2
AV Peak grad: 8.2 mmHg
Ao pk vel: 1.43 m/s
Area-P 1/2: 3.72 cm2
S' Lateral: 3.1 cm

## 2022-05-27 ENCOUNTER — Ambulatory Visit: Payer: Medicare HMO | Admitting: Cardiovascular Disease

## 2022-05-28 ENCOUNTER — Other Ambulatory Visit: Payer: Self-pay | Admitting: Cardiovascular Disease

## 2022-06-03 ENCOUNTER — Telehealth: Payer: Self-pay | Admitting: Cardiovascular Disease

## 2022-06-03 DIAGNOSIS — C44519 Basal cell carcinoma of skin of other part of trunk: Secondary | ICD-10-CM | POA: Diagnosis not present

## 2022-06-03 DIAGNOSIS — D0462 Carcinoma in situ of skin of left upper limb, including shoulder: Secondary | ICD-10-CM | POA: Diagnosis not present

## 2022-06-03 DIAGNOSIS — L570 Actinic keratosis: Secondary | ICD-10-CM | POA: Diagnosis not present

## 2022-06-03 DIAGNOSIS — D485 Neoplasm of uncertain behavior of skin: Secondary | ICD-10-CM | POA: Diagnosis not present

## 2022-06-03 NOTE — Telephone Encounter (Signed)
Pt would like to switch Providers From: Claiborne Billings at [facility]  To: Oval Linsey at [facility] number [phone]

## 2022-06-04 ENCOUNTER — Encounter: Payer: Self-pay | Admitting: Cardiovascular Disease

## 2022-06-06 NOTE — Telephone Encounter (Signed)
Ok by me

## 2022-06-09 NOTE — Telephone Encounter (Signed)
Informed patient provider switch had been approved. He states he will call back to schedule

## 2022-06-11 ENCOUNTER — Other Ambulatory Visit: Payer: Self-pay

## 2022-06-19 ENCOUNTER — Encounter: Payer: Self-pay | Admitting: Cardiology

## 2022-06-19 ENCOUNTER — Ambulatory Visit: Payer: Medicare HMO | Admitting: Cardiology

## 2022-06-19 VITALS — BP 136/80 | HR 43 | Ht 72.0 in | Wt 230.0 lb

## 2022-06-19 DIAGNOSIS — Z79899 Other long term (current) drug therapy: Secondary | ICD-10-CM

## 2022-06-19 DIAGNOSIS — I4819 Other persistent atrial fibrillation: Secondary | ICD-10-CM | POA: Diagnosis not present

## 2022-06-19 NOTE — Patient Instructions (Addendum)
Medication Instructions:  Your physician recommends that you continue on your current medications as directed. Please refer to the Current Medication list given to you today. *If you need a refill on your cardiac medications before your next appointment, please call your pharmacy*  Lab Work: None. If you have labs (blood work) drawn today and your tests are completely normal, you will receive your results only by: [place] (if you have MyChart) OR A paper copy in the mail If you have any lab test that is abnormal or we need to change your treatment, we will call you to review the results.  Testing/Procedures: Your physician has requested that you have cardiac CT. Cardiac computed tomography (CT) is a painless test that uses an x-ray machine to take clear, detailed pictures of your heart. For further information please visit [email or web address].[email or web address]. Please follow instruction sheet as given.   Your physician has recommended that you have an ablation. Catheter ablation is a medical procedure used to treat some cardiac arrhythmias (irregular heartbeats). During catheter ablation, a long, thin, flexible tube is put into a blood vessel in your groin (upper thigh), or neck. This tube is called an ablation catheter. It is then guided to your heart through the blood vessel. Radio frequency waves destroy small areas of heart tissue where abnormal heartbeats may cause an arrhythmia to start. Please see the instruction sheet given to you today.   Follow-Up: At [facility], you and your health needs are our priority.  As part of our continuing mission to provide you with exceptional heart care, we have created designated Provider Care Teams.  These Care Teams include your primary Cardiologist (physician) and Advanced Practice Providers (APPs -  Physician Assistants and Nurse Practitioners) who all work together to provide you with the care you need, when you need it.  Your physician wants you to  follow-up in: Ablation date picked is Oct 20 and pre op lab date is Sept 29. Nathan Kluver RN will call you with CT and ablation instructions.   We recommend signing up for the patient portal called "MyChart".  Sign up information is provided on this After Visit Summary.  MyChart is used to connect with patients for Virtual Visits (Telemedicine).  Patients are able to view lab/test results, encounter notes, upcoming appointments, etc.  Non-urgent messages can be sent to your provider as well.   To learn more about what you can do with MyChart, go to [email or web address].    Any Other Special Instructions Will Be Listed Below (If Applicable).  Cardiac Ablation Cardiac ablation is a procedure to destroy (ablate) some heart tissue that is sending bad signals. These bad signals cause problems in heart rhythm. The heart has many areas that make these signals. If there are problems in these areas, they can make the heart beat in a way that is not normal. Destroying some tissues can help make the heart rhythm normal. Tell your doctor about: Any allergies you have. All medicines you are taking. These include vitamins, herbs, eye drops, creams, and over-the-counter medicines. Any problems you or family members have had with medicines that make you fall asleep (anesthetics). Any blood disorders you have. Any surgeries you have had. Any medical conditions you have, such as kidney failure. Whether you are pregnant or may be pregnant. What are the risks? This is a safe procedure. But problems may occur, including: Infection. Bruising and bleeding. Bleeding into the chest. Stroke or blood clots. Damage to nearby areas of your body. Allergies to medicines  or dyes. The need for a pacemaker if the normal system is damaged. Failure of the procedure to treat the problem. What happens before the procedure? Medicines Ask your doctor about: Changing or stopping your normal medicines. This is important. Taking  aspirin and ibuprofen. Do not take these medicines unless your doctor tells you to take them. Taking other medicines, vitamins, herbs, and supplements. General instructions Follow instructions from your doctor about what you cannot eat or drink. Plan to have someone take you home from the hospital or clinic. If you will be going home right after the procedure, plan to have someone with you for 24 hours. Ask your doctor what steps will be taken to prevent infection. What happens during the procedure?  An IV tube will be put into one of your veins. You will be given a medicine to help you relax. The skin on your neck or groin will be numbed. A cut (incision) will be made in your neck or groin. A needle will be put through your cut and into a large vein. A tube (catheter) will be put into the needle. The tube will be moved to your heart. Dye may be put through the tube. This helps your doctor see your heart. Small devices (electrodes) on the tube will send out signals. A type of energy will be used to destroy some heart tissue. The tube will be taken out. Pressure will be held on your cut. This helps stop bleeding. A bandage will be put over your cut. The exact procedure may vary among doctors and hospitals. What happens after the procedure? You will be watched until you leave the hospital or clinic. This includes checking your heart rate, breathing rate, oxygen, and blood pressure. Your cut will be watched for bleeding. You will need to lie still for a few hours. Do not drive for 24 hours or as long as your doctor tells you. Summary Cardiac ablation is a procedure to destroy some heart tissue. This is done to treat heart rhythm problems. Tell your doctor about any medical conditions you may have. Tell him or her about all medicines you are taking to treat them. This is a safe procedure. But problems may occur. These include infection, bruising, bleeding, and damage to nearby areas of your  body. Follow what your doctor tells you about food and drink. You may also be told to change or stop some of your medicines. After the procedure, do not drive for 24 hours or as long as your doctor tells you. This information is not intended to replace advice given to you by your health care provider. Make sure you discuss any questions you have with your health care provider. Document Revised: 11/10/2019 Document Reviewed: 11/10/2019 Elsevier Patient Education  [place].

## 2022-06-19 NOTE — Progress Notes (Signed)
Electrophysiology Office Note:    Date:  06/19/2022   ID:  Nathan Mcclure, DOB September 20, 1951, MRN [id number]  PCP:  Deland Pretty, MD  [facility] Cardiologist:  None  [facility] Electrophysiologist:  Vickie Epley, MD   Referring MD: Sherran Needs, NP   Chief Complaint: New patient consult for Ablation  History of Present Illness:    Nathan Mcclure is a 71 y.o. male who presents for an evaluation for Afib ablation at the request of Roderic Palau, NP. Their medical history includes atrial fibrillation, CAD, GERD, hypertension, hyperlipidemia, nephrolithiasis, prostate cancer, rheumatoid arthritis and sleep apnea.  Previously it was noted his Afib had been quiet on amiodarone for years, so his amiodarone was reduced to 100 mg daily one year prior to reverting to Afib. He had a successful cardioversion 03/26/2022. On 04/10/2022 he saw Roderic Palau, NP and was in sinus bradycardia at 48 bpm. He frequently felt fatigued. He was referred to EP for further discussion/consideration of ablation.   Today, he confirms that he is able to feel a fluttering when he is in atrial fibrillation. He denies any palpitations since his cardioversion.   He states that he thinks he triggered his recent reversion to Afib as he had been taking high doses of ibuprofen for about a week.  They deny any chest pain, shortness of breath, or peripheral edema. No lightheadedness, headaches, syncope, orthopnea, or PND.     Past Medical History:  Diagnosis Date   Anxiety    Arthritis    Atrial fibrillation (HCC)    Benign essential tremor    no meds taken   Coronary artery disease    history of Atrial Fib.   Depression    GERD (gastroesophageal reflux disease)    History of kidney stones    Hyperlipidemia    Hypertension    Low testosterone    Palpitations    Prostate cancer (HCC)    Rheumatoid arthritis ([place])    Sleep apnea    has CPAP but not worn in 6 months    Past Surgical History:   Procedure Laterality Date   BACK SURGERY  2010   lower   CARDIAC CATHETERIZATION  05/14/2004   Contiued medical therapy   CARDIOVASCULAR STRESS TEST  09/19/2009   Observed defect is consistent with diaphragmatic attentuation. EKG negative for ischemia.   CARDIOVERSION N/A 11/20/2014   Procedure: CARDIOVERSION;  Surgeon: Troy Sine, MD;  Location: [facility] ENDOSCOPY;  Service: Cardiovascular;  Laterality: N/A;   CARDIOVERSION N/A 02/19/2016   Procedure: CARDIOVERSION;  Surgeon: Troy Sine, MD;  Location: [facility]COPY;  Service: Cardiovascular;  Laterality: N/A;   CARDIOVERSION N/A 03/26/2022   Procedure: CARDIOVERSION;  Surgeon: Janina Mayo, MD;  Location: [facility] ENDOSCOPY;  Service: Cardiovascular;  Laterality: N/A;   CERVICAL FUSION  2005   CYSTOSCOPY N/A 10/25/2020   Procedure: CYSTOSCOPY;  Surgeon: Irine Seal, MD;  Location: [facility];  Service: Urology;  Laterality: N/A;   EXTRACORPOREAL SHOCK WAVE LITHOTRIPSY Right 05/09/2019   Procedure: EXTRACORPOREAL SHOCK WAVE LITHOTRIPSY (ESWL);  Surgeon: Festus Aloe, MD;  Location: [facility];  Service: Urology;  Laterality: Right;   [place], 2007   stent insertion, stone basket    PROSTATE BIOPSY  2021   RADIOACTIVE SEED IMPLANT N/A 10/25/2020   Procedure: RADIOACTIVE SEED IMPLANT/BRACHYTHERAPY IMPLANT;  Surgeon: Irine Seal, MD;  Location: [facility];  Service: Urology;  Laterality: N/A;   RESECTION DISTAL CLAVICAL Right  07/06/2019   Procedure: OPEN DISTAL CLAVICLE EXCISION;  Surgeon: Earlie Server, MD;  Location: [place];  Service: Orthopedics;  Laterality: Right;   SHOULDER ARTHROSCOPY WITH ROTATOR CUFF REPAIR AND SUBACROMIAL DECOMPRESSION Right 07/06/2019   Procedure: SHOULDER ARTHROSCOPY WITH OPEN ROTATOR CUFF REPAIR, SUBACROMIAL DECOMPRESSION WITH EXTENSIVE DEBRIDEMENT;  Surgeon: Earlie Server, MD;  Location: [place];  Service: Orthopedics;  Laterality:  Right;   SHOULDER ARTHROSCOPY WITH ROTATOR CUFF REPAIR AND SUBACROMIAL DECOMPRESSION Left 04/24/2021   Procedure: SHOULDER ARTHROSCOPY WITH  SUBACROMIAL DECOMPRESSION;  Surgeon: Earlie Server, MD;  Location: [place];  Service: Orthopedics;  Laterality: Left;   SHOULDER OPEN ROTATOR CUFF REPAIR Left 04/24/2021   Procedure: OPEN ROTATOR CUFF REPAIR SHOULDER DISTAL CLAVICLE RESECTION;  Surgeon: Earlie Server, MD;  Location: [place];  Service: Orthopedics;  Laterality: Left;   SPACE OAR INSTILLATION N/A 10/25/2020   Procedure: SPACE OAR INSTILLATION;  Surgeon: Irine Seal, MD;  Location: [facility];  Service: Urology;  Laterality: N/A;   TONSILLECTOMY     as a child   TRANSTHORACIC ECHOCARDIOGRAM  03/29/2008   EF 67%, mild mitral and tricuspid valve regurg.    Current Medications: Current Meds  Medication Sig   amiodarone (PACERONE) 200 MG tablet TAKE 1 TABLET TWICE A DAY FOR THE NEXT MONTH THEN REDUCE TO 1 TABLET DAILY   ezetimibe (ZETIA) 10 MG tablet TAKE 1 TABLET (10 MG TOTAL) BY MOUTH DAILY. MUST KEEP APPOINTMENT FOR FUTURE REFILLS.   fexofenadine (ALLEGRA) 180 MG tablet Take 180 mg by mouth at bedtime.    lansoprazole (PREVACID) 15 MG capsule Take 15 mg by mouth at bedtime.   losartan (COZAAR) 50 MG tablet TAKE 1 TABLET TWICE DAILY   oxyCODONE (OXY IR/ROXICODONE) 5 MG immediate release tablet Take one tab po q4-6hrs prn pain, may need 1-2 first couple days   rosuvastatin (CRESTOR) 20 MG tablet Take 1 tablet (20 mg total) by mouth daily.   sertraline (ZOLOFT) 100 MG tablet Take 100 mg by mouth daily.   tamsulosin (FLOMAX) 0.4 MG CAPS capsule Take 1 capsule (0.4 mg total) by mouth daily.   XARELTO 20 MG TABS tablet TAKE 1 TABLET DAILY WITH SUPPER     Allergies:   Penicillins   Social History   Socioeconomic History   Marital status: Widowed    Spouse name: Not on file   Number of children: Not on file   Years of education: Not on  file   Highest education level: Not on file  Occupational History   Occupation: retired    Comment: [employer]  Tobacco Use   Smoking status: Former    Packs/day: 0.25    Years: 40.00    Total pack years: 10.00    Types: Cigarettes    Quit date: 01/25/2004    Years since quitting: 18.4   Smokeless tobacco: Never  Vaping Use   Vaping Use: Never used  Substance and Sexual Activity   Alcohol use: Not Currently    Alcohol/week: 4.0 standard drinks of alcohol    Types: 4 Standard drinks or equivalent per week    Comment: couple times a month   Drug use: No   Sexual activity: Not Currently  Other Topics Concern   Not on file  Social History Narrative   One of nine siblings (6 girls and 3 boys). Lost wife to cancer of the sinus approximately ten years ago (2011).    Social Determinants of Health  Financial Resource Strain: Not on file  Food Insecurity: Not on file  Transportation Needs: Not on file  Physical Activity: Not on file  Stress: Not on file  Social Connections: Not on file     Family History: The patient's family history includes Alopecia in his daughter; Alzheimer's disease in his father; Brain cancer in his sister; Diabetes in his mother; Heart disease in his mother and sister; Kidney failure in his mother; Lung cancer in his brother; Prostate cancer in his father; Tremor in his daughter.  ROS:   Please see the history of present illness.    All other systems reviewed and are negative.  EKGs/Labs/Other Studies Reviewed:    The following studies were reviewed today:  05/21/2022  Echocardiogram:  1. Left ventricular ejection fraction, by estimation, is 60 to 65%. The  left ventricle has normal function. The left ventricle has no regional  wall motion abnormalities. Left ventricular diastolic parameters were  normal.   2. Right ventricular systolic function is normal. The right ventricular  size is normal. Tricuspid regurgitation signal is inadequate for assessing   PA pressure.   3. The mitral valve is grossly normal. Trivial mitral valve  regurgitation. No evidence of mitral stenosis.   4. The aortic valve is tricuspid. Aortic valve regurgitation is not  visualized. No aortic stenosis is present.   5. There is mild dilatation of the ascending aorta, measuring 42 mm.   6. The inferior vena cava is normal in size with greater than 50%  respiratory variability, suggesting right atrial pressure of 3 mmHg.    EKG:   EKG is personally reviewed.  06/19/2022: Sinus bradycardia with a ventricular rate of 43 bpm   Recent Labs: 03/26/2022: BUN 19; Creatinine, Ser 1.17; Hemoglobin 12.9; Platelets 265; Potassium 4.3; Sodium 143   Recent Lipid Panel    Component Value Date/Time   CHOL 258 (H) 10/22/2020 1307   TRIG 84 10/22/2020 1307   HDL 45 10/22/2020 1307   CHOLHDL 5.7 10/22/2020 1307   VLDL 17 10/22/2020 1307   LDLCALC 196 (H) 10/22/2020 1307    Physical Exam:    VS:  BP 136/80 (BP Location: Left Arm, Patient Position: Sitting, Cuff Size: Normal)   Pulse (!) 43   Ht 6' (1.829 m)   Wt 230 lb (104.3 kg)   BMI 31.19 kg/m     Wt Readings from Last 3 Encounters:  06/19/22 230 lb (104.3 kg)  04/10/22 230 lb (104.3 kg)  03/26/22 226 lb (102.5 kg)     GEN: Well nourished, well developed in no acute distress HEENT: Normal NECK: No JVD; No carotid bruits LYMPHATICS: No lymphadenopathy CARDIAC: RRR, no murmurs, rubs, gallops RESPIRATORY:  Clear to auscultation without rales, wheezing or rhonchi  ABDOMEN: Soft, non-tender, non-distended MUSCULOSKELETAL:  No edema; No deformity  SKIN: Warm and dry NEUROLOGIC:  Alert and oriented x 3 PSYCHIATRIC:  Normal affect       ASSESSMENT:    1. Persistent atrial fibrillation ([place])   2. Encounter for long-term (current) use of high-risk medication    PLAN:    In order of problems listed above:   #Persistent atrial fibrillation Symptomatic.  Requiring amiodarone to maintain normal rhythm.  On  Xarelto for stroke prophylaxis.  We discussed management options for his atrial fibrillation including antiarrhythmic drugs and catheter ablation.  I do think a rhythm control strategy is indicated.  Given his young age, I think a strategy that avoids long-term exposure to amiodarone would be ideal.  I discussed the specifics of catheter ablation during today's appointment including the risks, benefits and likelihood of success.  We discussed the possibility of needing repeat ablation procedures.  He is to think about it and let us know how he would like to proceed.  He also has requested a referral to a general cardiologist.  I will put a referral in for Dr. Audie Box at the [facility] office.  Discussed treatment options today for his AF including antiarrhythmic drug therapy and ablation. Discussed risks, recovery and likelihood of success. Discussed potential need for repeat ablation procedures and antiarrhythmic drugs after an initial ablation. They wish to proceed with scheduling.  Risk, benefits, and alternatives to EP study and radiofrequency ablation for afib were also discussed in detail today. These risks include but are not limited to stroke, bleeding, vascular damage, tamponade, perforation, damage to the esophagus, lungs, and other structures, pulmonary vein stenosis, worsening renal function, and death. The patient understands these risks. If he elects to proceed, Cart[place], ICE, anesthesia are requested for the procedure.    Total time spent with patient today 60 minutes. This includes reviewing records, evaluating the patient and coordinating care.  Medication Adjustments/Labs and Tests Ordered: Current medicines are reviewed at length with the patient today.  Concerns regarding medicines are outlined above.  Orders Placed This Encounter  Procedures   Ambulatory referral to Cardiology   EKG 12-Lead   No orders of the defined types were placed in this encounter.   I,Mathew Stumpf,acting as  a Education administrator for Vickie Epley, MD.,have documented all relevant documentation on the behalf of Vickie Epley, MD,as directed by  Vickie Epley, MD while in the presence of Vickie Epley, MD.  I, Vickie Epley, MD, have reviewed all documentation for this visit. The documentation on 06/19/22 for the exam, diagnosis, procedures, and orders are all accurate and complete.   Signed, Hilton Cork. Quentin Ore, MD, [facility], [facility] 06/19/2022 11:42 AM    Electrophysiology [facility]

## 2022-06-23 NOTE — Progress Notes (Signed)
Called patient, it showed in his chart he was switching from [facility] to [facility]- both providers agreed to a switch.  However I contacted him to see which provider he would like to see. I can notify Dr.Hood's nurse to see if she can get him in with her first as I did not see an appointment scheduled.   Thank you!

## 2022-07-04 ENCOUNTER — Telehealth (HOSPITAL_BASED_OUTPATIENT_CLINIC_OR_DEPARTMENT_OTHER): Payer: Self-pay | Admitting: Cardiovascular Disease

## 2022-07-04 NOTE — Telephone Encounter (Signed)
Called patient to discuss scheduling New Patient appt with Dr. Mardella Layman switching providers from Dr. Claiborne Billings to Dr. Oval Linsey (has been approved by both providers)--He wil lcall on Monday

## 2022-07-04 NOTE — Telephone Encounter (Signed)
Patient is asking to change switch cardiac care from Dr. Claiborne Billings to Dr. Oval Linsey

## 2022-07-08 NOTE — Telephone Encounter (Signed)
Left message for patient to call and schedule visit with Dr. Oval Linsey ( changing providers from Dr. Claiborne Billings to Dr. Lindwood Coke by both providers

## 2022-07-11 ENCOUNTER — Telehealth (HOSPITAL_BASED_OUTPATIENT_CLINIC_OR_DEPARTMENT_OTHER): Payer: Self-pay | Admitting: Cardiovascular Disease

## 2022-07-11 NOTE — Telephone Encounter (Signed)
Called patient 07/08/22 and 07/11/22 to discuss scheduling the Transition of cardiac care from Dr. Claiborne Billings to Dr. Jorge Mandril answer and voice mail is full

## 2022-07-14 NOTE — Telephone Encounter (Signed)
Left message for patient to call and schedule Transfer of care appt with Dr. Hornitos---(transferring care from Dr. Claiborne Billings to Dr. Darolyn Rua in agreement)

## 2022-07-16 ENCOUNTER — Encounter (HOSPITAL_BASED_OUTPATIENT_CLINIC_OR_DEPARTMENT_OTHER): Payer: Self-pay | Admitting: Cardiovascular Disease

## 2022-07-16 NOTE — Telephone Encounter (Signed)
Left message for patient to call and schedule transition of care app with Dr. Oval Linsey (from Dr. Claiborne Billings)

## 2022-07-18 ENCOUNTER — Other Ambulatory Visit: Payer: Self-pay | Admitting: Cardiovascular Disease

## 2022-08-04 ENCOUNTER — Encounter (HOSPITAL_BASED_OUTPATIENT_CLINIC_OR_DEPARTMENT_OTHER): Payer: Self-pay | Admitting: *Deleted

## 2022-08-04 DIAGNOSIS — C44519 Basal cell carcinoma of skin of other part of trunk: Secondary | ICD-10-CM | POA: Insufficient documentation

## 2022-08-05 ENCOUNTER — Encounter (HOSPITAL_BASED_OUTPATIENT_CLINIC_OR_DEPARTMENT_OTHER): Payer: Self-pay | Admitting: Cardiovascular Disease

## 2022-08-05 ENCOUNTER — Ambulatory Visit (HOSPITAL_BASED_OUTPATIENT_CLINIC_OR_DEPARTMENT_OTHER): Payer: Medicare HMO | Admitting: Cardiovascular Disease

## 2022-08-05 DIAGNOSIS — G4733 Obstructive sleep apnea (adult) (pediatric): Secondary | ICD-10-CM

## 2022-08-05 DIAGNOSIS — E785 Hyperlipidemia, unspecified: Secondary | ICD-10-CM | POA: Diagnosis not present

## 2022-08-05 DIAGNOSIS — I4819 Other persistent atrial fibrillation: Secondary | ICD-10-CM

## 2022-08-05 DIAGNOSIS — I2510 Atherosclerotic heart disease of native coronary artery without angina pectoris: Secondary | ICD-10-CM

## 2022-08-05 NOTE — Progress Notes (Signed)
Cardiology Office Note:    Date:  08/05/2022   ID:  Nathan Mcclure, DOB 1951-03-21, MRN [id number]  PCP:  Deland Pretty, MD   [place] [facility] Cardiologist:  Skeet Latch, MD Electrophysiologist:  Vickie Epley, MD     Referring MD: Vickie Epley, MD   No chief complaint on file.   History of Present Illness:    Nathan Mcclure is a 71 y.o. male with a hx of persistent atrial fibrillation, non-obstructive CAD, OSA, hypertension, hyperlipidemia, and RA, here for follow-up. He was previously seen in our [place] office. He had a cath in 2005 that showed mild non-obstructive disease. He developed atrial fibrillation in 2015 and was started on metoprolol and Xarelto. Echo at that time revealed LVEF 50-55% with mild to moderate mitral regurgitation. He was started on propafenone. He was also found to have sleep apnea and started on CPAP, but then transitioned to BiPAP. He had a cardioversion for Afib in 2018. He was transitioned from propafenone to amiodarone. He has worked with Dr. Claiborne Billings for sleep medicine and the Afib clinic. He had another cardioversion 03/2022. He was seen in clinic later that month and was bradycardic to 48 bpm. He saw Dr. Quentin Ore 05/2022 and planned for ablation.  Today, he states he is feeling okay. He continues to contemplate proceeding with the ablation. He is wary of the fact that scar tissue is formed in his heart during the procedure. When he is in atrial fibrillation, he feels exhausted. He notes that while on amiodarone he feels just as exhausted. At this time, he states that he does not feel like he is in atrial fibrillation. Per his smart watch, his resting heart rate is in the 50's. In the mornings his heart rate is in the high 40's, and increases to the 70's while walking. For exercise he usually walks 10,000 to 12,000 steps a day, works around the house, and walks his 2 large dogs. While walking he develops aches and pains which he attributes  to his arthritis. Sometimes he is lightheaded if he stands up too quickly. In clinic today his blood pressure is 132/66; he did not take his antihypertensives this morning. Usually he takes his doses at 10 AM and 10 PM. Currently he is using a CPAP, but he does not believe it is working well. It has been 5 years since he last received an updated prescription for a CPAP. He denies any palpitations, chest pain, shortness of breath, or peripheral edema. No headaches, syncope, orthopnea, or PND.   Past Medical History:  Diagnosis Date   Anxiety    Arthritis    Atrial fibrillation (HCC)    Benign essential tremor    no meds taken   Coronary artery disease    history of Atrial Fib.   Depression    GERD (gastroesophageal reflux disease)    History of kidney stones    Hyperlipidemia    Hypertension    Low testosterone    Palpitations    Prostate cancer (HCC)    Rheumatoid arthritis ([place])    Sleep apnea    has CPAP but not worn in 6 months    Past Surgical History:  Procedure Laterality Date   BACK SURGERY  2010   lower   CARDIAC CATHETERIZATION  05/14/2004   Contiued medical therapy   CARDIOVASCULAR STRESS TEST  09/19/2009   Observed defect is consistent with diaphragmatic attentuation. EKG negative for ischemia.   CARDIOVERSION N/A 11/20/2014   Procedure:  CARDIOVERSION;  Surgeon: Troy Sine, MD;  Location: [place];  Service: Cardiovascular;  Laterality: N/A;   CARDIOVERSION N/A 02/19/2016   Procedure: CARDIOVERSION;  Surgeon: Troy Sine, MD;  Location: [facility] ENDOSCOPY;  Service: Cardiovascular;  Laterality: N/A;   CARDIOVERSION N/A 03/26/2022   Procedure: CARDIOVERSION;  Surgeon: Janina Mayo, MD;  Location: [facility] ENDOSCOPY;  Service: Cardiovascular;  Laterality: N/A;   CERVICAL FUSION  2005   CYSTOSCOPY N/A 10/25/2020   Procedure: CYSTOSCOPY;  Surgeon: Irine Seal, MD;  Location: [facility];  Service: Urology;  Laterality: N/A;   EXTRACORPOREAL SHOCK WAVE  LITHOTRIPSY Right 05/09/2019   Procedure: EXTRACORPOREAL SHOCK WAVE LITHOTRIPSY (ESWL);  Surgeon: Festus Aloe, MD;  Location: [facility];  Service: Urology;  Laterality: Right;   [place], 2007   stent insertion, stone basket    PROSTATE BIOPSY  2021   RADIOACTIVE SEED IMPLANT N/A 10/25/2020   Procedure: RADIOACTIVE SEED IMPLANT/BRACHYTHERAPY IMPLANT;  Surgeon: Irine Seal, MD;  Location: [facility];  Service: Urology;  Laterality: N/A;   RESECTION DISTAL CLAVICAL Right 07/06/2019   Procedure: OPEN DISTAL CLAVICLE EXCISION;  Surgeon: Earlie Server, MD;  Location: [place];  Service: Orthopedics;  Laterality: Right;   SHOULDER ARTHROSCOPY WITH ROTATOR CUFF REPAIR AND SUBACROMIAL DECOMPRESSION Right 07/06/2019   Procedure: SHOULDER ARTHROSCOPY WITH OPEN ROTATOR CUFF REPAIR, SUBACROMIAL DECOMPRESSION WITH EXTENSIVE DEBRIDEMENT;  Surgeon: Earlie Server, MD;  Location: [place];  Service: Orthopedics;  Laterality: Right;   SHOULDER ARTHROSCOPY WITH ROTATOR CUFF REPAIR AND SUBACROMIAL DECOMPRESSION Left 04/24/2021   Procedure: SHOULDER ARTHROSCOPY WITH  SUBACROMIAL DECOMPRESSION;  Surgeon: Earlie Server, MD;  Location: [place];  Service: Orthopedics;  Laterality: Left;   SHOULDER OPEN ROTATOR CUFF REPAIR Left 04/24/2021   Procedure: OPEN ROTATOR CUFF REPAIR SHOULDER DISTAL CLAVICLE RESECTION;  Surgeon: Earlie Server, MD;  Location: [place];  Service: Orthopedics;  Laterality: Left;   SPACE OAR INSTILLATION N/A 10/25/2020   Procedure: SPACE OAR INSTILLATION;  Surgeon: Irine Seal, MD;  Location: [facility];  Service: Urology;  Laterality: N/A;   TONSILLECTOMY     as a child   TRANSTHORACIC ECHOCARDIOGRAM  03/29/2008   EF 67%, mild mitral and tricuspid valve regurg.    Current Medications: Current Meds  Medication Sig   amiodarone (PACERONE) 200 MG tablet TAKE 1 TABLET TWICE A DAY  FOR THE NEXT MONTH THEN REDUCE TO 1 TABLET DAILY   ezetimibe (ZETIA) 10 MG tablet TAKE 1 TABLET (10 MG TOTAL) BY MOUTH DAILY. MUST KEEP APPOINTMENT FOR FUTURE REFILLS.   fexofenadine (ALLEGRA) 180 MG tablet Take 180 mg by mouth at bedtime.    lansoprazole (PREVACID) 15 MG capsule Take 15 mg by mouth at bedtime.   losartan (COZAAR) 50 MG tablet TAKE 1 TABLET TWICE DAILY   Multiple Vitamin (MULTIVITAMIN ADULT PO) Take by mouth daily.   oxyCODONE (OXY IR/ROXICODONE) 5 MG immediate release tablet Take one tab po q4-6hrs prn pain, may need 1-2 first couple days   rosuvastatin (CRESTOR) 20 MG tablet TAKE 1 TABLET EVERY DAY   sertraline (ZOLOFT) 100 MG tablet Take 100 mg by mouth daily.   tamsulosin (FLOMAX) 0.4 MG CAPS capsule Take 1 capsule (0.4 mg total) by mouth daily.   XARELTO 20 MG TABS tablet TAKE 1 TABLET DAILY WITH SUPPER     Allergies:   Penicillins   Social History   Socioeconomic History   Marital status: Widowed  Spouse name: Not on file   Number of children: Not on file   Years of education: Not on file   Highest education level: Not on file  Occupational History   Occupation: retired    Comment: [employer]  Tobacco Use   Smoking status: Former    Packs/day: 0.25    Years: 40.00    Total pack years: 10.00    Types: Cigarettes    Quit date: 01/25/2004    Years since quitting: 18.5   Smokeless tobacco: Never  Vaping Use   Vaping Use: Never used  Substance and Sexual Activity   Alcohol use: Not Currently    Alcohol/week: 4.0 standard drinks of alcohol    Types: 4 Standard drinks or equivalent per week    Comment: couple times a month   Drug use: No   Sexual activity: Not Currently  Other Topics Concern   Not on file  Social History Narrative   One of nine siblings (6 girls and 3 boys). Lost wife to cancer of the sinus approximately ten years ago (2011).    Social Determinants of Health   Financial Resource Strain: Not on file  Food Insecurity: Not on file   Transportation Needs: Not on file  Physical Activity: Not on file  Stress: Not on file  Social Connections: Not on file     Family History: The patient's family history includes Alopecia in his daughter; Alzheimer's disease in his father; Brain cancer in his sister; Diabetes in his mother; Heart disease in his mother and sister; Kidney failure in his mother; Lung cancer in his brother; Prostate cancer in his father; Tremor in his daughter.  ROS:   Please see the history of present illness.    (+) Fatigue (+) Arthralgias (+) Lightheadedness All other systems reviewed and are negative.  EKGs/Labs/Other Studies Reviewed:    The following studies were reviewed today:  Echo  05/21/2022:  1. Left ventricular ejection fraction, by estimation, is 60 to 65%. The  left ventricle has normal function. The left ventricle has no regional  wall motion abnormalities. Left ventricular diastolic parameters were  normal.   2. Right ventricular systolic function is normal. The right ventricular  size is normal. Tricuspid regurgitation signal is inadequate for assessing  PA pressure.   3. The mitral valve is grossly normal. Trivial mitral valve  regurgitation. No evidence of mitral stenosis.   4. The aortic valve is tricuspid. Aortic valve regurgitation is not  visualized. No aortic stenosis is present.   5. There is mild dilatation of the ascending aorta, measuring 42 mm.   6. The inferior vena cava is normal in size with greater than 50%  respiratory variability, suggesting right atrial pressure of 3 mmHg.   EKG:   EKG is personally reviewed. 08/05/2022: EKG was not ordered.   Recent Labs: 03/26/2022: BUN 19; Creatinine, Ser 1.17; Hemoglobin 12.9; Platelets 265; Potassium 4.3; Sodium 143   Recent Lipid Panel    Component Value Date/Time   CHOL 258 (H) 10/22/2020 1307   TRIG 84 10/22/2020 1307   HDL 45 10/22/2020 1307   CHOLHDL 5.7 10/22/2020 1307   VLDL 17 10/22/2020 1307   LDLCALC 196  (H) 10/22/2020 1307     Risk Assessment/Calculations:    CHA2DS2-VASc Score = 2   This indicates a 2.2% annual risk of stroke. The patient's score is based upon: CHF History: 0 HTN History: 1 Diabetes History: 0 Stroke History: 0 Vascular Disease History: 0 Age Score: 1 Gender  Score: 0          Physical Exam:    Wt Readings from Last 3 Encounters:  08/05/22 229 lb (103.9 kg)  06/19/22 230 lb (104.3 kg)  04/10/22 230 lb (104.3 kg)     VS:  BP 132/66   Pulse 62   Ht 6' (1.829 m)   Wt 229 lb (103.9 kg)   BMI 31.06 kg/m  , BMI Body mass index is 31.06 kg/m. GENERAL:  Well appearing HEENT: Pupils equal round and reactive, fundi not visualized, oral mucosa unremarkable NECK:  No jugular venous distention, waveform within normal limits, carotid upstroke brisk and symmetric, no bruits, no thyromegaly LUNGS:  Clear to auscultation bilaterally HEART:  Bradycardic.  Regular rhythm.  PMI not displaced or sustained,S1 and S2 within normal limits, no S3, no S4, no clicks, no rubs, no murmurs ABD:  Flat, positive bowel sounds normal in frequency in pitch, no bruits, no rebound, no guarding, no midline pulsatile mass, no hepatomegaly, no splenomegaly EXT:  2 plus pulses throughout, no edema, no cyanosis no clubbing SKIN:  No rashes no nodules NEURO:  Cranial nerves II through XII grossly intact, motor grossly intact throughout PSYCH:  Cognitively intact, oriented to person place and time   ASSESSMENT:    1. Persistent atrial fibrillation ([place])   2. Obstructive sleep apnea   3. Dyslipidemia   4. Coronary artery disease involving native coronary artery of native heart without angina pectoris    PLAN:    Persistent atrial fibrillation (HCC) Currently maintaining sinus rhythm on amiodarone.  He is symptomatic in atrial fibrillation.  He continues to have fatigue now that he is in sinus rhythm and attributes this to the amiodarone.  I am also wonder if his bradycardia is  contributing.  He has decided that he would like to proceed with ablation with Dr. Quentin Ore.  Continue amiodarone and Xarelto for now.  Obstructive sleep apnea He has been using his CPAP machine faithfully.  However he does not think that it is functioning properly.  It is unclear whether this is contributing to his fatigue.  He will consider getting a repeat sleep study after his ablation if he continues to be fatigued.  Dyslipidemia Lipids are well controlled on rosuvastatin and Zetia.  He did not tolerate higher doses of rosuvastatin due to myalgias.  CAD- mild CAD '00 and '05. Low risk Nuc 2010 Nonobstructive CAD.  Continue Xarelto, rosuvastatin, and Zetia.  He has no ischemic symptoms and walks 10 to 12,000 steps daily.        Disposition: FU with Shaya Reddick C. Oval Linsey, MD, [facility] in 6 months.  Medication Adjustments/Labs and Tests Ordered: Current medicines are reviewed at length with the patient today.  Concerns regarding medicines are outlined above.   No orders of the defined types were placed in this encounter.  No orders of the defined types were placed in this encounter.  Patient Instructions  Medication Instructions:  Continue current medication  *If you need a refill on your cardiac medications before your next appointment, please call your pharmacy*   Lab Work: None Ordered   Testing/Procedures: None Ordered   Follow-Up: At [facility], you and your health needs are our priority.  As part of our continuing mission to provide you with exceptional heart care, we have created designated Provider Care Teams.  These Care Teams include your primary Cardiologist (physician) and Advanced Practice Providers (APPs -  Physician Assistants and Nurse Practitioners) who all work together to provide you  with the care you need, when you need it.  We recommend signing up for the patient portal called "MyChart".  Sign up information is provided on this After Visit Summary.   MyChart is used to connect with patients for Virtual Visits (Telemedicine).  Patients are able to view lab/test results, encounter notes, upcoming appointments, etc.  Non-urgent messages can be sent to your provider as well.   To learn more about what you can do with MyChart, go to [email or web address].    Your next appointment:   6 month(s)  The format for your next appointment:   In Person  Provider:   Skeet Latch, MD    Other Instructions           I,Mathew Stumpf,acting as a scribe for Skeet Latch, MD.,have documented all relevant documentation on the behalf of Skeet Latch, MD,as directed by  Skeet Latch, MD while in the presence of Skeet Latch, MD.  I, [place] Oval Linsey, MD have reviewed all documentation for this visit.  The documentation of the exam, diagnosis, procedures, and orders on 08/05/2022 are all accurate and complete.   Signed, Skeet Latch, MD  08/05/2022 3:58 PM    [place]

## 2022-08-05 NOTE — Assessment & Plan Note (Signed)
Currently maintaining sinus rhythm on amiodarone.  He is symptomatic in atrial fibrillation.  He continues to have fatigue now that he is in sinus rhythm and attributes this to the amiodarone.  I am also wonder if his bradycardia is contributing.  He has decided that he would like to proceed with ablation with Dr. Quentin Ore.  Continue amiodarone and Xarelto for now.

## 2022-08-05 NOTE — Assessment & Plan Note (Signed)
Nonobstructive CAD.  Continue Xarelto, rosuvastatin, and Zetia.  He has no ischemic symptoms and walks 10 to 12,000 steps daily.

## 2022-08-05 NOTE — Assessment & Plan Note (Signed)
He has been using his CPAP machine faithfully.  However he does not think that it is functioning properly.  It is unclear whether this is contributing to his fatigue.  He will consider getting a repeat sleep study after his ablation if he continues to be fatigued.

## 2022-08-05 NOTE — Patient Instructions (Signed)
Medication Instructions:  Continue current medication  *If you need a refill on your cardiac medications before your next appointment, please call your pharmacy*   Lab Work: None Ordered   Testing/Procedures: None Ordered   Follow-Up: At [facility], you and your health needs are our priority.  As part of our continuing mission to provide you with exceptional heart care, we have created designated Provider Care Teams.  These Care Teams include your primary Cardiologist (physician) and Advanced Practice Providers (APPs -  Physician Assistants and Nurse Practitioners) who all work together to provide you with the care you need, when you need it.  We recommend signing up for the patient portal called "MyChart".  Sign up information is provided on this After Visit Summary.  MyChart is used to connect with patients for Virtual Visits (Telemedicine).  Patients are able to view lab/test results, encounter notes, upcoming appointments, etc.  Non-urgent messages can be sent to your provider as well.   To learn more about what you can do with MyChart, go to [email or web address].    Your next appointment:   6 month(s)  The format for your next appointment:   In Person  Provider:   Skeet Latch, MD    Other Instructions

## 2022-08-05 NOTE — Assessment & Plan Note (Signed)
Lipids are well controlled on rosuvastatin and Zetia.  He did not tolerate higher doses of rosuvastatin due to myalgias.

## 2022-08-20 DIAGNOSIS — M4712 Other spondylosis with myelopathy, cervical region: Secondary | ICD-10-CM | POA: Diagnosis not present

## 2022-08-20 DIAGNOSIS — Z79891 Long term (current) use of opiate analgesic: Secondary | ICD-10-CM | POA: Diagnosis not present

## 2022-08-20 DIAGNOSIS — Z79899 Other long term (current) drug therapy: Secondary | ICD-10-CM | POA: Diagnosis not present

## 2022-08-20 DIAGNOSIS — M4714 Other spondylosis with myelopathy, thoracic region: Secondary | ICD-10-CM | POA: Diagnosis not present

## 2022-08-20 DIAGNOSIS — G894 Chronic pain syndrome: Secondary | ICD-10-CM | POA: Diagnosis not present

## 2022-08-20 DIAGNOSIS — M25562 Pain in left knee: Secondary | ICD-10-CM | POA: Diagnosis not present

## 2022-08-29 ENCOUNTER — Telehealth: Payer: Self-pay | Admitting: *Deleted

## 2022-08-29 ENCOUNTER — Encounter: Payer: Self-pay | Admitting: *Deleted

## 2022-08-29 DIAGNOSIS — Z01818 Encounter for other preprocedural examination: Secondary | ICD-10-CM

## 2022-08-29 DIAGNOSIS — I4819 Other persistent atrial fibrillation: Secondary | ICD-10-CM

## 2022-09-01 NOTE — Telephone Encounter (Signed)
Procedure work up completed.

## 2022-09-02 DIAGNOSIS — D0462 Carcinoma in situ of skin of left upper limb, including shoulder: Secondary | ICD-10-CM | POA: Diagnosis not present

## 2022-09-02 DIAGNOSIS — D485 Neoplasm of uncertain behavior of skin: Secondary | ICD-10-CM | POA: Diagnosis not present

## 2022-09-02 DIAGNOSIS — D1721 Benign lipomatous neoplasm of skin and subcutaneous tissue of right arm: Secondary | ICD-10-CM | POA: Diagnosis not present

## 2022-09-02 DIAGNOSIS — L570 Actinic keratosis: Secondary | ICD-10-CM | POA: Diagnosis not present

## 2022-09-05 NOTE — Telephone Encounter (Signed)
Left message to call back.   Dr. Mardene Speak procedure days have changed. His Oct 20 ablation is now on Oct 17.

## 2022-09-08 ENCOUNTER — Telehealth: Payer: Self-pay | Admitting: Cardiology

## 2022-09-08 NOTE — Telephone Encounter (Signed)
Pt returning a call about ablation

## 2022-09-10 NOTE — Telephone Encounter (Signed)
See other phone encounter.  

## 2022-09-10 NOTE — Telephone Encounter (Signed)
Notified the patient about the new procedure date.  Verbalized understanding and agreement.

## 2022-09-19 ENCOUNTER — Ambulatory Visit: Payer: Medicare HMO | Attending: Cardiology

## 2022-09-19 DIAGNOSIS — Z01818 Encounter for other preprocedural examination: Secondary | ICD-10-CM

## 2022-09-19 DIAGNOSIS — I4819 Other persistent atrial fibrillation: Secondary | ICD-10-CM | POA: Diagnosis not present

## 2022-09-20 LAB — CBC WITH DIFFERENTIAL/PLATELET
Basophils Absolute: 0 10*3/uL (ref 0.0–0.2)
Basos: 0 %
EOS (ABSOLUTE): 0.2 10*3/uL (ref 0.0–0.4)
Eos: 2 %
Hematocrit: 42.8 % (ref 37.5–51.0)
Hemoglobin: 13.6 g/dL (ref 13.0–17.7)
Immature Grans (Abs): 0 10*3/uL (ref 0.0–0.1)
Immature Granulocytes: 0 %
Lymphocytes Absolute: 1.5 10*3/uL (ref 0.7–3.1)
Lymphs: 21 %
MCH: 29.1 pg (ref 26.6–33.0)
MCHC: 31.8 g/dL (ref 31.5–35.7)
MCV: 92 fL (ref 79–97)
Monocytes Absolute: 0.6 10*3/uL (ref 0.1–0.9)
Monocytes: 8 %
Neutrophils Absolute: 4.8 10*3/uL (ref 1.4–7.0)
Neutrophils: 69 %
Platelets: 244 10*3/uL (ref 150–450)
RBC: 4.68 x10E6/uL (ref 4.14–5.80)
RDW: 13.6 % (ref 11.6–15.4)
WBC: 7.1 10*3/uL (ref 3.4–10.8)

## 2022-09-20 LAB — BASIC METABOLIC PANEL
BUN/Creatinine Ratio: 27 — ABNORMAL HIGH (ref 10–24)
BUN: 34 mg/dL — ABNORMAL HIGH (ref 8–27)
CO2: 26 mmol/L (ref 20–29)
Calcium: 9.3 mg/dL (ref 8.6–10.2)
Chloride: 105 mmol/L (ref 96–106)
Creatinine, Ser: 1.25 mg/dL (ref 0.76–1.27)
Glucose: 98 mg/dL (ref 70–99)
Potassium: 4.5 mmol/L (ref 3.5–5.2)
Sodium: 144 mmol/L (ref 134–144)
eGFR: 62 mL/min/{1.73_m2} (ref >59)

## 2022-10-01 ENCOUNTER — Telehealth (HOSPITAL_COMMUNITY): Payer: Self-pay | Admitting: *Deleted

## 2022-10-01 NOTE — Telephone Encounter (Signed)
Attempted to call patient regarding upcoming cardiac CT appointment. °Left message on voicemail with name and callback number ° °Audery Wassenaar RN Navigator Cardiac Imaging °[facility] °[phone] Office °[phone] Cell ° °

## 2022-10-02 ENCOUNTER — Ambulatory Visit (HOSPITAL_BASED_OUTPATIENT_CLINIC_OR_DEPARTMENT_OTHER)
Admission: RE | Admit: 2022-10-02 | Discharge: 2022-10-02 | Disposition: A | Discharge status: Home / Self Care | Payer: Medicare HMO | Source: Ambulatory Visit | Attending: Cardiology | Admitting: Cardiology

## 2022-10-02 ENCOUNTER — Encounter (HOSPITAL_BASED_OUTPATIENT_CLINIC_OR_DEPARTMENT_OTHER): Payer: Self-pay

## 2022-10-02 DIAGNOSIS — Z01818 Encounter for other preprocedural examination: Secondary | ICD-10-CM | POA: Diagnosis not present

## 2022-10-02 DIAGNOSIS — I4819 Other persistent atrial fibrillation: Secondary | ICD-10-CM | POA: Diagnosis not present

## 2022-10-02 MED ORDER — IOHEXOL 350 MG/ML SOLN
100.0000 mL | Freq: Once | INTRAVENOUS | Status: AC | PRN
  Administered 2022-10-02: 80 mL via INTRAVENOUS

## 2022-10-06 NOTE — Telephone Encounter (Signed)
Discussed procedure tomorrow and not having anyone to stay the night with him, but will have a ride the morning after. Advised like we talked about before if he doesn't have a ride then he will have to stay the night in the hospital and he can't go home. Verbalized understanding. Made Dr. Quentin Ore aware.

## 2022-10-06 NOTE — Pre-Procedure Instructions (Signed)
Attempted to call patient regarding procedure instructions.  Left voicemail  on the following items: Arrival time 0830 Nothing to eat or drink after midnight No meds AM of procedure Responsible person to drive you home and stay with you for 24 hrs  Have you missed any doses of anti-coagulant Xarelto- if you have missed any doses please call the office

## 2022-10-07 ENCOUNTER — Encounter (HOSPITAL_COMMUNITY)
Admission: RE | Disposition: A | Discharge status: Home / Self Care | Payer: Self-pay | Source: Ambulatory Visit | Attending: Cardiology

## 2022-10-07 ENCOUNTER — Ambulatory Visit (HOSPITAL_BASED_OUTPATIENT_CLINIC_OR_DEPARTMENT_OTHER): Payer: Medicare HMO | Admitting: Anesthesiology

## 2022-10-07 ENCOUNTER — Other Ambulatory Visit: Payer: Self-pay

## 2022-10-07 ENCOUNTER — Ambulatory Visit (HOSPITAL_COMMUNITY): Payer: Medicare HMO | Admitting: Anesthesiology

## 2022-10-07 ENCOUNTER — Ambulatory Visit (HOSPITAL_COMMUNITY)
Admission: RE | Admit: 2022-10-07 | Discharge: 2022-10-08 | Disposition: A | Discharge status: Home / Self Care | Payer: Medicare HMO | Source: Ambulatory Visit | Attending: Cardiology | Admitting: Cardiology

## 2022-10-07 DIAGNOSIS — Z87891 Personal history of nicotine dependence: Secondary | ICD-10-CM

## 2022-10-07 DIAGNOSIS — I10 Essential (primary) hypertension: Secondary | ICD-10-CM

## 2022-10-07 DIAGNOSIS — I4819 Other persistent atrial fibrillation: Secondary | ICD-10-CM | POA: Diagnosis not present

## 2022-10-07 DIAGNOSIS — I2510 Atherosclerotic heart disease of native coronary artery without angina pectoris: Secondary | ICD-10-CM | POA: Insufficient documentation

## 2022-10-07 DIAGNOSIS — E785 Hyperlipidemia, unspecified: Secondary | ICD-10-CM | POA: Diagnosis not present

## 2022-10-07 DIAGNOSIS — M069 Rheumatoid arthritis, unspecified: Secondary | ICD-10-CM | POA: Diagnosis not present

## 2022-10-07 DIAGNOSIS — F418 Other specified anxiety disorders: Secondary | ICD-10-CM

## 2022-10-07 DIAGNOSIS — G4730 Sleep apnea, unspecified: Secondary | ICD-10-CM | POA: Insufficient documentation

## 2022-10-07 DIAGNOSIS — I4891 Unspecified atrial fibrillation: Secondary | ICD-10-CM | POA: Diagnosis present

## 2022-10-07 DIAGNOSIS — K219 Gastro-esophageal reflux disease without esophagitis: Secondary | ICD-10-CM | POA: Insufficient documentation

## 2022-10-07 HISTORY — PX: ATRIAL FIBRILLATION ABLATION: EP1191

## 2022-10-07 LAB — POCT ACTIVATED CLOTTING TIME
Activated Clotting Time: 353 seconds
Activated Clotting Time: 305 seconds

## 2022-10-07 SURGERY — ATRIAL FIBRILLATION ABLATION
Anesthesia: General

## 2022-10-07 MED ORDER — EZETIMIBE 10 MG PO TABS
10.0000 mg | ORAL_TABLET | Freq: Every evening | ORAL | Status: DC
  Administered 2022-10-07: 10 mg via ORAL
  Filled 2022-10-07: qty 1

## 2022-10-07 MED ORDER — LOSARTAN POTASSIUM 50 MG PO TABS
50.0000 mg | ORAL_TABLET | Freq: Two times a day (BID) | ORAL | Status: DC
  Administered 2022-10-07 – 2022-10-08 (×2): 50 mg via ORAL
  Filled 2022-10-07 (×2): qty 1

## 2022-10-07 MED ORDER — DEXAMETHASONE SODIUM PHOSPHATE 10 MG/ML IJ SOLN
INTRAMUSCULAR | Status: DC | PRN
  Administered 2022-10-07: 10 mg via INTRAVENOUS

## 2022-10-07 MED ORDER — HEPARIN (PORCINE) IN NACL 1000-0.9 UT/500ML-% IV SOLN
INTRAVENOUS | Status: AC
  Filled 2022-10-07: qty 1500

## 2022-10-07 MED ORDER — HEPARIN SODIUM (PORCINE) 1000 UNIT/ML IJ SOLN
INTRAMUSCULAR | Status: AC
  Filled 2022-10-07: qty 10

## 2022-10-07 MED ORDER — HEPARIN SODIUM (PORCINE) 1000 UNIT/ML IJ SOLN
INTRAMUSCULAR | Status: DC | PRN
  Administered 2022-10-07: 1000 [IU] via INTRAVENOUS

## 2022-10-07 MED ORDER — SUGAMMADEX SODIUM 200 MG/2ML IV SOLN
INTRAVENOUS | Status: DC | PRN
  Administered 2022-10-07: 400 mg via INTRAVENOUS

## 2022-10-07 MED ORDER — HEPARIN (PORCINE) IN NACL 1000-0.9 UT/500ML-% IV SOLN
INTRAVENOUS | Status: DC | PRN
  Administered 2022-10-07 (×3): 500 mL

## 2022-10-07 MED ORDER — ROCURONIUM BROMIDE 10 MG/ML (PF) SYRINGE
PREFILLED_SYRINGE | INTRAVENOUS | Status: DC | PRN
  Administered 2022-10-07: 40 mg via INTRAVENOUS
  Administered 2022-10-07: 10 mg via INTRAVENOUS

## 2022-10-07 MED ORDER — SODIUM CHLORIDE 0.9% FLUSH: 3.0000 mL | INTRAVENOUS | Status: DC | PRN

## 2022-10-07 MED ORDER — PHENYLEPHRINE HCL-NACL 20-0.9 MG/250ML-% IV SOLN
INTRAVENOUS | Status: DC | PRN
  Administered 2022-10-07: 25 ug/min via INTRAVENOUS

## 2022-10-07 MED ORDER — HEPARIN SODIUM (PORCINE) 1000 UNIT/ML IJ SOLN
INTRAMUSCULAR | Status: DC | PRN
  Administered 2022-10-07: 16000 [IU] via INTRAVENOUS

## 2022-10-07 MED ORDER — SUCCINYLCHOLINE CHLORIDE 200 MG/10ML IV SOSY
PREFILLED_SYRINGE | INTRAVENOUS | Status: DC | PRN
  Administered 2022-10-07: 120 mg via INTRAVENOUS

## 2022-10-07 MED ORDER — ONDANSETRON HCL 4 MG/2ML IJ SOLN: 4.0000 mg | Freq: Four times a day (QID) | INTRAMUSCULAR | Status: DC | PRN

## 2022-10-07 MED ORDER — LIDOCAINE 2% (20 MG/ML) 5 ML SYRINGE
INTRAMUSCULAR | Status: DC | PRN
  Administered 2022-10-07: 60 mg via INTRAVENOUS

## 2022-10-07 MED ORDER — OXYCODONE HCL 5 MG PO TABS
ORAL_TABLET | ORAL | Status: AC
  Filled 2022-10-07: qty 1

## 2022-10-07 MED ORDER — COLCHICINE 0.6 MG PO TABS
0.6000 mg | ORAL_TABLET | Freq: Two times a day (BID) | ORAL | Status: DC
  Administered 2022-10-07 – 2022-10-08 (×3): 0.6 mg via ORAL
  Filled 2022-10-07 (×3): qty 1

## 2022-10-07 MED ORDER — SODIUM CHLORIDE 0.9 % IV SOLN: INTRAVENOUS | Status: DC

## 2022-10-07 MED ORDER — AMIODARONE HCL 100 MG PO TABS
100.0000 mg | ORAL_TABLET | Freq: Every day | ORAL | Status: DC
  Administered 2022-10-08: 100 mg via ORAL
  Filled 2022-10-07: qty 1

## 2022-10-07 MED ORDER — SODIUM CHLORIDE 0.9 % IV SOLN: 250.0000 mL | INTRAVENOUS | Status: DC | PRN

## 2022-10-07 MED ORDER — FENTANYL CITRATE (PF) 250 MCG/5ML IJ SOLN
INTRAMUSCULAR | Status: DC | PRN
  Administered 2022-10-07 (×2): 50 ug via INTRAVENOUS

## 2022-10-07 MED ORDER — SODIUM CHLORIDE 0.9% FLUSH
3.0000 mL | Freq: Two times a day (BID) | INTRAVENOUS | Status: DC
  Administered 2022-10-07 (×2): 3 mL via INTRAVENOUS

## 2022-10-07 MED ORDER — RIVAROXABAN 20 MG PO TABS
20.0000 mg | ORAL_TABLET | Freq: Every day | ORAL | Status: DC
  Administered 2022-10-07: 20 mg via ORAL
  Filled 2022-10-07: qty 1

## 2022-10-07 MED ORDER — EPHEDRINE SULFATE-NACL 50-0.9 MG/10ML-% IV SOSY
PREFILLED_SYRINGE | INTRAVENOUS | Status: DC | PRN
  Administered 2022-10-07 (×4): 5 mg via INTRAVENOUS

## 2022-10-07 MED ORDER — ONDANSETRON HCL 4 MG/2ML IJ SOLN
INTRAMUSCULAR | Status: DC | PRN
  Administered 2022-10-07: 4 mg via INTRAVENOUS

## 2022-10-07 MED ORDER — PROPOFOL 10 MG/ML IV BOLUS
INTRAVENOUS | Status: DC | PRN
  Administered 2022-10-07: 50 mg via INTRAVENOUS
  Administered 2022-10-07: 200 mg via INTRAVENOUS

## 2022-10-07 MED ORDER — PROTAMINE SULFATE 10 MG/ML IV SOLN
INTRAVENOUS | Status: DC | PRN
  Administered 2022-10-07: 35 mg via INTRAVENOUS

## 2022-10-07 MED ORDER — PANTOPRAZOLE SODIUM 40 MG PO TBEC
40.0000 mg | DELAYED_RELEASE_TABLET | Freq: Every day | ORAL | Status: DC
  Administered 2022-10-07 – 2022-10-08 (×2): 40 mg via ORAL
  Filled 2022-10-07 (×2): qty 1

## 2022-10-07 MED ORDER — ACETAMINOPHEN 325 MG PO TABS: 650.0000 mg | ORAL_TABLET | ORAL | Status: DC | PRN

## 2022-10-07 MED ORDER — OXYCODONE HCL 5 MG PO TABS
5.0000 mg | ORAL_TABLET | Freq: Four times a day (QID) | ORAL | Status: DC | PRN
  Administered 2022-10-07 (×2): 5 mg via ORAL
  Filled 2022-10-07: qty 1

## 2022-10-07 SURGICAL SUPPLY — 18 items
CATH 8FR REPROCESSED SOUNDSTAR (CATHETERS) ×1 IMPLANT
CATH 8FR SOUNDSTAR REPROCESSED (CATHETERS) IMPLANT
CATH ABLAT QDOT MICRO BI TC DF (CATHETERS) IMPLANT
CATH OCTARAY 2.0 F 3-3-3-3-3 (CATHETERS) IMPLANT
CATH S-M CIRCA TEMP PROBE (CATHETERS) IMPLANT
CATH WEB BI DIR CSDF CRV REPRO (CATHETERS) IMPLANT
CLOSURE PERCLOSE PROSTYLE (VASCULAR PRODUCTS) IMPLANT
COVER SWIFTLINK CONNECTOR (BAG) ×1 IMPLANT
PACK EP LATEX FREE (CUSTOM PROCEDURE TRAY) ×1
PACK EP LF (CUSTOM PROCEDURE TRAY) ×1 IMPLANT
PAD DEFIB RADIO PHYSIO CONN (PAD) ×1 IMPLANT
PATCH CARTO3 (PAD) IMPLANT
SHEATH BAYLIS TRANSSEPTAL 98CM (NEEDLE) IMPLANT
SHEATH CARTO VIZIGO SM CVD (SHEATH) IMPLANT
SHEATH PINNACLE 8F 10CM (SHEATH) IMPLANT
SHEATH PINNACLE 9F 10CM (SHEATH) IMPLANT
SHEATH PROBE COVER 6X72 (BAG) IMPLANT
TUBING SMART ABLATE COOLFLOW (TUBING) IMPLANT

## 2022-10-07 NOTE — Anesthesia Postprocedure Evaluation (Signed)
Anesthesia Post Note  Patient: Nathan Mcclure  Procedure(s) Performed: ATRIAL FIBRILLATION ABLATION     Patient location during evaluation: PACU Anesthesia Type: General Level of consciousness: awake and alert Pain management: pain level controlled Vital Signs Assessment: post-procedure vital signs reviewed and stable Respiratory status: spontaneous breathing, nonlabored ventilation, respiratory function stable and patient connected to nasal cannula oxygen Cardiovascular status: blood pressure returned to baseline and stable Postop Assessment: no apparent nausea or vomiting Anesthetic complications: no   There were no known notable events for this encounter.  Last Vitals:  Vitals:   10/07/22 0831  BP: (!) 161/68  Pulse: (!) 52  Resp: 16  Temp: 36.4 C  SpO2: 97%    Last Pain:  Vitals:   10/07/22 0844  TempSrc:   PainSc: 2                  Theresia Pree S

## 2022-10-07 NOTE — Discharge Instructions (Signed)

## 2022-10-07 NOTE — Progress Notes (Signed)
Responded to page to assist patient with Advance Directive per nurse request.  Document was given to patient to take with him. He said that he thought he already had one but not sure.    Jaclynn Major, [place], [facility], Pager [phone]

## 2022-10-07 NOTE — Progress Notes (Signed)
Pt admitted to [place] from the cath lab AxOx4, VS wnL and as per flow. B/L groins C/D/I level 0. Pt re-educated on his bedrest terms. Pt oriented to 6E processes. Pt familiar with the [facility] system. All questions and concerns addressed. Call bell placed within reach, will continue to monitor and maintain safety.

## 2022-10-07 NOTE — H&P (Signed)
Electrophysiology Office Note:     Date:  10/07/2022    ID:  SAYF KERNER, DOB 02-06-1951, MRN [id number]   PCP:  Deland Pretty, MD     [facility] Cardiologist:  None  [facility] Electrophysiologist:  Vickie Epley, MD    Referring MD: Sherran Needs, NP    Chief Complaint: New patient consult for Ablation   History of Present Illness:     Nathan Mcclure is a 71 y.o. male who presents for an evaluation for Afib ablation at the request of Roderic Palau, NP. Their medical history includes atrial fibrillation, CAD, GERD, hypertension, hyperlipidemia, nephrolithiasis, prostate cancer, rheumatoid arthritis and sleep apnea.   Previously it was noted his Afib had been quiet on amiodarone for years, so his amiodarone was reduced to 100 mg daily one year prior to reverting to Afib. He had a successful cardioversion 03/26/2022. On 04/10/2022 he saw Roderic Palau, NP and was in sinus bradycardia at 48 bpm. He frequently felt fatigued. He was referred to EP for further discussion/consideration of ablation.    Today, he confirms that he is able to feel a fluttering when he is in atrial fibrillation. He denies any palpitations since his cardioversion.    He states that he thinks he triggered his recent reversion to Afib as he had been taking high doses of ibuprofen for about a week.   They deny any chest pain, shortness of breath, or peripheral edema. No lightheadedness, headaches, syncope, orthopnea, or PND.   Today he presents for PVI.     Objective      Past Medical History:  Diagnosis Date   Anxiety     Arthritis     Atrial fibrillation (HCC)     Benign essential tremor      no meds taken   Coronary artery disease      history of Atrial Fib.   Depression     GERD (gastroesophageal reflux disease)     History of kidney stones     Hyperlipidemia     Hypertension     Low testosterone     Palpitations     Prostate cancer (HCC)     Rheumatoid arthritis ([place])      Sleep apnea      has CPAP but not worn in 6 months           Past Surgical History:  Procedure Laterality Date   BACK SURGERY   2010    lower   CARDIAC CATHETERIZATION   05/14/2004    Contiued medical therapy   CARDIOVASCULAR STRESS TEST   09/19/2009    Observed defect is consistent with diaphragmatic attentuation. EKG negative for ischemia.   CARDIOVERSION N/A 11/20/2014    Procedure: CARDIOVERSION;  Surgeon: Troy Sine, MD;  Location: [facility] ENDOSCOPY;  Service: Cardiovascular;  Laterality: N/A;   CARDIOVERSION N/A 02/19/2016    Procedure: CARDIOVERSION;  Surgeon: Troy Sine, MD;  Location: [facility] ENDOSCOPY;  Service: Cardiovascular;  Laterality: N/A;   CARDIOVERSION N/A 03/26/2022    Procedure: CARDIOVERSION;  Surgeon: Janina Mayo, MD;  Location: [facility] ENDOSCOPY;  Service: Cardiovascular;  Laterality: N/A;   CERVICAL FUSION   2005   CYSTOSCOPY N/A 10/25/2020    Procedure: CYSTOSCOPY;  Surgeon: Irine Seal, MD;  Location: [facility];  Service: Urology;  Laterality: N/A;   EXTRACORPOREAL SHOCK WAVE LITHOTRIPSY Right 05/09/2019    Procedure: EXTRACORPOREAL SHOCK WAVE LITHOTRIPSY (ESWL);  Surgeon: Festus Aloe, MD;  Location: [facility] ORS;  Service: Urology;  Laterality: Right;   [place], 2007    stent insertion, stone basket    PROSTATE BIOPSY   2021   RADIOACTIVE SEED IMPLANT N/A 10/25/2020    Procedure: RADIOACTIVE SEED IMPLANT/BRACHYTHERAPY IMPLANT;  Surgeon: Irine Seal, MD;  Location[facility];  Service: Urology;  Laterality: N/A;   RESECTION DISTAL CLAVICAL Right 07/06/2019    Procedure: OPEN DISTAL CLAVICLE EXCISION;  Surgeon: Earlie Server, MD;  Location: [place];  Service: Orthopedics;  Laterality: Right;   SHOULDER ARTHROSCOPY WITH ROTATOR CUFF REPAIR AND SUBACROMIAL DECOMPRESSION Right 07/06/2019    Procedure: SHOULDER ARTHROSCOPY WITH OPEN ROTATOR CUFF REPAIR, SUBACROMIAL DECOMPRESSION WITH EXTENSIVE  DEBRIDEMENT;  Surgeon: Earlie Server, MD;  Location: [place];  Service: Orthopedics;  Laterality: Right;   SHOULDER ARTHROSCOPY WITH ROTATOR CUFF REPAIR AND SUBACROMIAL DECOMPRESSION Left 04/24/2021    Procedure: SHOULDER ARTHROSCOPY WITH  SUBACROMIAL DECOMPRESSION;  Surgeon: Earlie Server, MD;  Location: [place];  Service: Orthopedics;  Laterality: Left;   SHOULDER OPEN ROTATOR CUFF REPAIR Left 04/24/2021    Procedure: OPEN ROTATOR CUFF REPAIR SHOULDER DISTAL CLAVICLE RESECTION;  Surgeon: Earlie Server, MD;  Location: [place];  Service: Orthopedics;  Laterality: Left;   SPACE OAR INSTILLATION N/A 10/25/2020    Procedure: SPACE OAR INSTILLATION;  Surgeon: Irine Seal, MD;  Location: [facility];  Service: Urology;  Laterality: N/A;   TONSILLECTOMY        as a child   TRANSTHORACIC ECHOCARDIOGRAM   03/29/2008    EF 67%, mild mitral and tricuspid valve regurg.      Current Medications: Active Medications      Current Meds  Medication Sig   amiodarone (PACERONE) 200 MG tablet TAKE 1 TABLET TWICE A DAY FOR THE NEXT MONTH THEN REDUCE TO 1 TABLET DAILY   ezetimibe (ZETIA) 10 MG tablet TAKE 1 TABLET (10 MG TOTAL) BY MOUTH DAILY. MUST KEEP APPOINTMENT FOR FUTURE REFILLS.   fexofenadine (ALLEGRA) 180 MG tablet Take 180 mg by mouth at bedtime.    lansoprazole (PREVACID) 15 MG capsule Take 15 mg by mouth at bedtime.   losartan (COZAAR) 50 MG tablet TAKE 1 TABLET TWICE DAILY   oxyCODONE (OXY IR/ROXICODONE) 5 MG immediate release tablet Take one tab po q4-6hrs prn pain, may need 1-2 first couple days   rosuvastatin (CRESTOR) 20 MG tablet Take 1 tablet (20 mg total) by mouth daily.   sertraline (ZOLOFT) 100 MG tablet Take 100 mg by mouth daily.   tamsulosin (FLOMAX) 0.4 MG CAPS capsule Take 1 capsule (0.4 mg total) by mouth daily.   XARELTO 20 MG TABS tablet TAKE 1 TABLET DAILY WITH SUPPER        Allergies:   Penicillins    Social  History         Socioeconomic History   Marital status: Widowed      Spouse name: Not on file   Number of children: Not on file   Years of education: Not on file   Highest education level: Not on file  Occupational History   Occupation: retired      Comment: [employer]  Tobacco Use   Smoking status: Former      Packs/day: 0.25      Years: 40.00      Total pack years: 10.00      Types: Cigarettes      Quit date: 01/25/2004      Years  since quitting: 18.4   Smokeless tobacco: Never  Vaping Use   Vaping Use: Never used  Substance and Sexual Activity   Alcohol use: Not Currently      Alcohol/week: 4.0 standard drinks of alcohol      Types: 4 Standard drinks or equivalent per week      Comment: couple times a month   Drug use: No   Sexual activity: Not Currently  Other Topics Concern   Not on file  Social History Narrative    One of nine siblings (6 girls and 3 boys). Lost wife to cancer of the sinus approximately ten years ago (2011).     Social Determinants of Health    Financial Resource Strain: Not on file  Food Insecurity: Not on file  Transportation Needs: Not on file  Physical Activity: Not on file  Stress: Not on file  Social Connections: Not on file      Family History: The patient's family history includes Alopecia in his daughter; Alzheimer's disease in his father; Brain cancer in his sister; Diabetes in his mother; Heart disease in his mother and sister; Kidney failure in his mother; Lung cancer in his brother; Prostate cancer in his father; Tremor in his daughter.   ROS:   Please see the history of present illness.    All other systems reviewed and are negative.   EKGs/Labs/Other Studies Reviewed:     The following studies were reviewed today:   05/21/2022  Echocardiogram:  1. Left ventricular ejection fraction, by estimation, is 60 to 65%. The  left ventricle has normal function. The left ventricle has no regional  wall motion abnormalities. Left  ventricular diastolic parameters were  normal.   2. Right ventricular systolic function is normal. The right ventricular  size is normal. Tricuspid regurgitation signal is inadequate for assessing  PA pressure.   3. The mitral valve is grossly normal. Trivial mitral valve  regurgitation. No evidence of mitral stenosis.   4. The aortic valve is tricuspid. Aortic valve regurgitation is not  visualized. No aortic stenosis is present.   5. There is mild dilatation of the ascending aorta, measuring 42 mm.   6. The inferior vena cava is normal in size with greater than 50%  respiratory variability, suggesting right atrial pressure of 3 mmHg.      EKG:   EKG is personally reviewed.  06/19/2022: Sinus bradycardia with a ventricular rate of 43 bpm     Recent Labs: 03/26/2022: BUN 19; Creatinine, Ser 1.17; Hemoglobin 12.9; Platelets 265; Potassium 4.3; Sodium 143    Recent Lipid Panel Labs (Brief)          Component Value Date/Time    CHOL 258 (H) 10/22/2020 1307    TRIG 84 10/22/2020 1307    HDL 45 10/22/2020 1307    CHOLHDL 5.7 10/22/2020 1307    VLDL 17 10/22/2020 1307    LDLCALC 196 (H) 10/22/2020 1307        Physical Exam:     VS:  BP 161/68 (BP Location: Left Arm, Patient Position: Sitting, Cuff Size: Normal)   Pulse 52   Ht 6' (1.829 m)   Wt 230 lb (104.3 kg)   BMI 31.19 kg/m         Wt Readings from Last 3 Encounters:  06/19/22 230 lb (104.3 kg)  04/10/22 230 lb (104.3 kg)  03/26/22 226 lb (102.5 kg)      GEN: Well nourished, well developed in no acute distress HEENT: Normal NECK:  No JVD; No carotid bruits LYMPHATICS: No lymphadenopathy CARDIAC: RRR, no murmurs, rubs, gallops RESPIRATORY:  Clear to auscultation without rales, wheezing or rhonchi  ABDOMEN: Soft, non-tender, non-distended MUSCULOSKELETAL:  No edema; No deformity  SKIN: Warm and dry NEUROLOGIC:  Alert and oriented x 3 PSYCHIATRIC:  Normal affect          Assessment ASSESSMENT:     1.  Persistent atrial fibrillation ([place])   2. Encounter for long-term (current) use of high-risk medication     PLAN:     In order of problems listed above:     #Persistent atrial fibrillation Symptomatic.  Requiring amiodarone to maintain normal rhythm.  On Xarelto for stroke prophylaxis.  We discussed management options for his atrial fibrillation including antiarrhythmic drugs and catheter ablation.  I do think a rhythm control strategy is indicated.  Given his young age, I think a strategy that avoids long-term exposure to amiodarone would be ideal.  I discussed the specifics of catheter ablation during today's appointment including the risks, benefits and likelihood of success.  We discussed the possibility of needing repeat ablation procedures.  He is to think about it and let us know how he would like to proceed.   He also has requested a referral to a general cardiologist.  I will put a referral in for Dr. Audie Box at the [facility] office.   Discussed treatment options today for his AF including antiarrhythmic drug therapy and ablation. Discussed risks, recovery and likelihood of success. Discussed potential need for repeat ablation procedures and antiarrhythmic drugs after an initial ablation. They wish to proceed with scheduling.   Risk, benefits, and alternatives to EP study and radiofrequency ablation for afib were also discussed in detail today. These risks include but are not limited to stroke, bleeding, vascular damage, tamponade, perforation, damage to the esophagus, lungs, and other structures, pulmonary vein stenosis, worsening renal function, and death. The patient understands these risks. If he elects to proceed, Carto, ICE, anesthesia are requested for the procedure.     Plan for PVI today. Procedure reviewed.   Signed, Hilton Cork. Quentin Ore, MD, [facility], [facility] 10/07/2022 Electrophysiology [facility]

## 2022-10-07 NOTE — Anesthesia Procedure Notes (Signed)
Procedure Name: Intubation Date/Time: 10/07/2022 10:17 AM  Performed by: Gaylene Brooks, CRNAPre-anesthesia Checklist: Patient identified, Emergency Drugs available, Suction available and Patient being monitored Patient Re-evaluated:Patient Re-evaluated prior to induction Oxygen Delivery Method: Circle System Utilized Preoxygenation: Pre-oxygenation with 100% oxygen Induction Type: IV induction and Rapid sequence Ventilation: Mask ventilation without difficulty and Two handed mask ventilation required Laryngoscope Size: Glidescope and 4 Grade View: Grade I Tube type: Oral Tube size: 7.5 mm Number of attempts: 1 Airway Equipment and Method: Stylet and Oral airway Placement Confirmation: ETT inserted through vocal cords under direct vision, positive ETCO2 and breath sounds checked- equal and bilateral Secured at: 23 cm Tube secured with: Tape Dental Injury: Teeth and Oropharynx as per pre-operative assessment

## 2022-10-07 NOTE — Transfer of Care (Signed)
Immediate Anesthesia Transfer of Care Note  Patient: Nathan Mcclure  Procedure(s) Performed: ATRIAL FIBRILLATION ABLATION  Patient Location: Cath Lab  Anesthesia Type:General  Level of Consciousness: awake, drowsy and patient cooperative  Airway & Oxygen Therapy: Patient Spontanous Breathing and Patient connected to nasal cannula oxygen  Post-op Assessment: Report given to RN, Post -op Vital signs reviewed and stable and Patient moving all extremities X 4  Post vital signs: Reviewed and stable  Last Vitals:  Vitals Value Taken Time  BP 129/47 10/07/22 1157  Temp    Pulse 72 10/07/22 1159  Resp 15 10/07/22 1159  SpO2 97 % 10/07/22 1159  Vitals shown include unvalidated device data.  Last Pain:  Vitals:   10/07/22 0844  TempSrc:   PainSc: 2       Patients Stated Pain Goal: 0 (25/63/89 3734)  Complications: There were no known notable events for this encounter.

## 2022-10-07 NOTE — Anesthesia Preprocedure Evaluation (Addendum)
Anesthesia Evaluation  Patient identified by MRN, date of birth, ID band Patient awake    Reviewed: Allergy & Precautions, NPO status , Patient's Chart, lab work & pertinent test results  Airway Mallampati: IV  TM Distance: >3 FB Neck ROM: Full    Dental  (+) Teeth Intact, Dental Advisory Given   Pulmonary sleep apnea (noncompliant w/ CPAP) , former smoker,  Former smoker, quit 2005, 10 pack year history    Pulmonary exam normal breath sounds clear to auscultation       Cardiovascular hypertension (161/68 in preop), Pt. on medications + dysrhythmias (xarelto, LD last night 9pm) Atrial Fibrillation  Rhythm:Irregular Rate:Normal  Echo 04/2022 1. Left ventricular ejection fraction, by estimation, is 60 to 65%. The left ventricle has normal function. The left ventricle has no regional wall motion abnormalities. Left ventricular diastolic parameters were  2. Right ventricular systolic function is normal. The right ventricular size is normal. Tricuspid regurgitation signal is inadequate for assessing PA pressure.  3. The mitral valve is grossly normal. Trivial mitral valve  regurgitation. No evidence of mitral stenosis.  4. The aortic valve is tricuspid. Aortic valve regurgitation is not visualized. No aortic stenosis is present.  5. There is mild dilatation of the ascending aorta, measuring 42 mm.  6. The inferior vena cava is normal in size with greater than 50% respiratory variability, suggesting right atrial pressure of 3 mmHg.    Neuro/Psych PSYCHIATRIC DISORDERS Anxiety Depression    GI/Hepatic Neg liver ROS, GERD  Medicated and Controlled,  Endo/Other  negative endocrine ROS  Renal/GU negative Renal ROS  negative genitourinary   Musculoskeletal  (+) Arthritis , Osteoarthritis and Rheumatoid disorders,  Chronic pain- oxy '5mg'$ , last dose yesterday    Abdominal   Peds  Hematology negative hematology ROS (+)    Anesthesia Other Findings   Reproductive/Obstetrics negative OB ROS                          Anesthesia Physical Anesthesia Plan  ASA: 3  Anesthesia Plan: General   Post-op Pain Management:    Induction: Intravenous  PONV Risk Score and Plan: Ondansetron, Dexamethasone, Midazolam and Treatment may vary due to age or medical condition  Airway Management Planned: Oral ETT and Video Laryngoscope Planned  Additional Equipment: None  Intra-op Plan:   Post-operative Plan: Extubation in OR  Informed Consent: I have reviewed the patients History and Physical, chart, labs and discussed the procedure including the risks, benefits and alternatives for the proposed anesthesia with the patient or authorized representative who has indicated his/her understanding and acceptance.     Dental advisory given  Plan Discussed with: CRNA  Anesthesia Plan Comments:       Anesthesia Quick Evaluation

## 2022-10-08 ENCOUNTER — Encounter (HOSPITAL_COMMUNITY): Payer: Self-pay | Admitting: Cardiology

## 2022-10-08 DIAGNOSIS — I2510 Atherosclerotic heart disease of native coronary artery without angina pectoris: Secondary | ICD-10-CM | POA: Diagnosis not present

## 2022-10-08 DIAGNOSIS — K219 Gastro-esophageal reflux disease without esophagitis: Secondary | ICD-10-CM | POA: Diagnosis not present

## 2022-10-08 DIAGNOSIS — M069 Rheumatoid arthritis, unspecified: Secondary | ICD-10-CM | POA: Diagnosis not present

## 2022-10-08 DIAGNOSIS — G4730 Sleep apnea, unspecified: Secondary | ICD-10-CM | POA: Diagnosis not present

## 2022-10-08 DIAGNOSIS — I10 Essential (primary) hypertension: Secondary | ICD-10-CM | POA: Diagnosis not present

## 2022-10-08 DIAGNOSIS — E785 Hyperlipidemia, unspecified: Secondary | ICD-10-CM | POA: Diagnosis not present

## 2022-10-08 DIAGNOSIS — I4819 Other persistent atrial fibrillation: Secondary | ICD-10-CM | POA: Diagnosis not present

## 2022-10-08 MED ORDER — PANTOPRAZOLE SODIUM 40 MG PO TBEC: 40.0000 mg | DELAYED_RELEASE_TABLET | Freq: Every day | ORAL | 0 refills | Status: DC

## 2022-10-08 MED ORDER — COLCHICINE 0.6 MG PO TABS: 0.6000 mg | ORAL_TABLET | Freq: Two times a day (BID) | ORAL | 0 refills | Status: DC

## 2022-10-08 NOTE — Discharge Summary (Signed)
ELECTROPHYSIOLOGY PROCEDURE DISCHARGE SUMMARY    Patient ID: Nathan Mcclure,  MRN: [id number], DOB/AGE: 1951/04/10 71 y.o.  Admit date: 10/07/2022 Discharge date: 10/08/2022  Primary Care Physician: Deland Pretty, MD  Primary Cardiologist: Skeet Latch, MD  Electrophysiologist: Dr. Quentin Ore  Primary Discharge Diagnosis:  Persistent Atrial Fibrillation  Procedures This Admission:  1.  Electrophysiology study and radiofrequency catheter ablation of Atrial Fibrillation on 10/07/2022 by Dr. Quentin Ore.   This study demonstrated:   i. Successful PVI ii. Intracardiac echo reveals dilated LA, trivial pericardial effusion iii. No early apparent complications. iv. Colchicine 0.'6mg'$  PO BID x 5 days v. Protonix '40mg'$  PO daily x 45 days   Brief HPI: Nathan Mcclure is a 71 y.o. male with a history of Atrial Fibrillation.  They have failed medical therapy with amiodarone. Risks, benefits, and alternatives to catheter ablation of Atrial Fibrillation were reviewed with the patient who wished to proceed.   The patient has been on uninterrupted anticoagulation for more than 3 weeks and did not require TEE. ICE did not show thrombus.   Hospital Course:  The patient was admitted and underwent EPS/RFCA of Atrial Fibrillation with details as outlined above.  They were monitored on telemetry overnight which demonstrated NSR.  Groin was without complication on the day of discharge.  The patient was examined and considered to be stable for discharge.  Wound care and restrictions were reviewed with the patient.  The patient will be seen back by Roderic Palau, NP in 4 weeks and Dr. Quentin Ore in 12 weeks for post ablation follow up.   CHA2DS2VASC is at least 3  Physical Exam: Vitals:   10/07/22 1526 10/07/22 1955 10/08/22 0025 10/08/22 0522  BP: 138/69 134/73 111/66 132/61  Pulse: 77 82 71 63  Resp: '17 20 15 17  '$ Temp:  98 F (36.7 C) 98 F (36.7 C) 98.2 F (36.8 C)  TempSrc:  Axillary Axillary  Oral  SpO2: 97% 96% 95% 95%  Weight:      Height:        GEN- The patient is well appearing, alert and oriented x 3 today.   HEENT: normocephalic, atraumatic; sclera clear, conjunctiva pink; hearing intact; oropharynx clear; neck supple  Lungs- Clear to ausculation bilaterally, normal work of breathing.  No wheezes, rales, rhonchi Heart- Regular rate and rhythm, no murmurs, rubs or gallops  GI- soft, non-tender, non-distended, bowel sounds present  Extremities- no clubbing, cyanosis, or edema; DP/PT/radial pulses 2+ bilaterally, groin without hematoma/bruit MS- no significant deformity or atrophy Skin- warm and dry, no rash or lesion Psych- euthymic mood, full affect Neuro- strength and sensation are intact   Labs:   Lab Results  Component Value Date   WBC 7.1 09/19/2022   HGB 13.6 09/19/2022   HCT 42.8 09/19/2022   MCV 92 09/19/2022   PLT 244 09/19/2022   No results for input(s): "NA", "K", "CL", "CO2", "BUN", "CREATININE", "CALCIUM", "PROT", "BILITOT", "ALKPHOS", "ALT", "AST", "GLUCOSE" in the last 168 hours.  Invalid input(s): "LABALBU"   Discharge Medications:  Allergies as of 10/08/2022       Reactions   Penicillins    Childhood allergy        Medication List     STOP taking these medications    lansoprazole 15 MG capsule Commonly known as: PREVACID       TAKE these medications    acetaminophen 650 MG CR tablet Commonly known as: TYLENOL Take 1,300 mg by mouth every 8 (eight) hours as needed for  pain.   amiodarone 200 MG tablet Commonly known as: PACERONE TAKE 1 TABLET TWICE A DAY FOR THE NEXT MONTH THEN REDUCE TO 1 TABLET DAILY What changed:  how much to take how to take this when to take this additional instructions   celecoxib 200 MG capsule Commonly known as: CELEBREX Take 200 mg by mouth every evening.   colchicine 0.6 MG tablet Take 1 tablet (0.6 mg total) by mouth 2 (two) times daily for 5 days.   docusate sodium 100 MG  capsule Commonly known as: COLACE Take 100 mg by mouth daily as needed for moderate constipation.   ezetimibe 10 MG tablet Commonly known as: ZETIA Take 10 mg by mouth every evening.   fexofenadine 180 MG tablet Commonly known as: ALLEGRA Take 180 mg by mouth at bedtime.   fluticasone 50 MCG/ACT nasal spray Commonly known as: FLONASE Place 1 spray into both nostrils daily as needed for allergies or rhinitis.   ketotifen 0.025 % ophthalmic solution Commonly known as: ZADITOR Place 1 drop into both eyes 2 (two) times daily as needed (allergies).   losartan 50 MG tablet Commonly known as: COZAAR TAKE 1 TABLET TWICE DAILY   MULTIVITAMIN ADULT PO Take 1 tablet by mouth daily.   oxyCODONE 5 MG immediate release tablet Commonly known as: Oxy IR/ROXICODONE Take one tab po q4-6hrs prn pain, may need 1-2 first couple days   pantoprazole 40 MG tablet Commonly known as: PROTONIX Take 1 tablet (40 mg total) by mouth daily.   rosuvastatin 20 MG tablet Commonly known as: CRESTOR TAKE 1 TABLET EVERY DAY What changed: when to take this   sertraline 100 MG tablet Commonly known as: ZOLOFT Take 100 mg by mouth daily.   tamsulosin 0.4 MG Caps capsule Commonly known as: FLOMAX Take 1 capsule (0.4 mg total) by mouth daily.   Xarelto 20 MG Tabs tablet Generic drug: rivaroxaban TAKE 1 TABLET DAILY WITH SUPPER        Disposition:    Follow-up Information     [facility] Follow up.   Specialty: Cardiology Why: 11/21 at 1100 for post hospital check Contact information: [address] [id number] [place] [phone]                Duration of Discharge Encounter: Greater than 30 minutes including physician time.  Jacalyn Lefevre, PA-C  10/08/2022 9:16 AM

## 2022-10-08 NOTE — Plan of Care (Signed)
  Problem: Education: Goal: Understanding of disease, treatment, and recovery process will improve Outcome: Progressing   Problem: Activity: Goal: Ability to return to baseline activity level will improve Outcome: Progressing   Problem: Cardiac: Goal: Ability to maintain adequate cardiovascular perfusion will improve Outcome: Progressing Goal: Vascular access site(s) Level 0-1 will be maintained Outcome: Progressing   Problem: Health Behavior/ Discharge Planning: Goal: Ability to safely manage health related needs after discharge Outcome: Progressing   Problem: Education: Goal: Knowledge of General Education information will improve Description: Including pain rating scale, medication(s)/side effects and non-pharmacologic comfort measures Outcome: Progressing   Problem: Health Behavior/Discharge Planning: Goal: Ability to manage health-related needs will improve Outcome: Progressing   Problem: Clinical Measurements: Goal: Ability to maintain clinical measurements within normal limits will improve Outcome: Progressing Goal: Will remain free from infection Outcome: Progressing Goal: Diagnostic test results will improve Outcome: Progressing Goal: Respiratory complications will improve Outcome: Progressing Goal: Cardiovascular complication will be avoided Outcome: Progressing   Problem: Activity: Goal: Risk for activity intolerance will decrease Outcome: Progressing   Problem: Nutrition: Goal: Adequate nutrition will be maintained Outcome: Progressing   Problem: Coping: Goal: Level of anxiety will decrease Outcome: Progressing   Problem: Elimination: Goal: Will not experience complications related to bowel motility Outcome: Progressing Goal: Will not experience complications related to urinary retention Outcome: Progressing   Problem: Pain Managment: Goal: General experience of comfort will improve Outcome: Progressing   Problem: Safety: Goal: Ability to  remain free from injury will improve Outcome: Progressing   Problem: Skin Integrity: Goal: Risk for impaired skin integrity will decrease Outcome: Progressing   

## 2022-10-20 ENCOUNTER — Ambulatory Visit (HOSPITAL_BASED_OUTPATIENT_CLINIC_OR_DEPARTMENT_OTHER): Payer: Medicare HMO | Admitting: Cardiovascular Disease

## 2022-10-20 ENCOUNTER — Other Ambulatory Visit: Payer: Self-pay | Admitting: Cardiovascular Disease

## 2022-10-21 DIAGNOSIS — M059 Rheumatoid arthritis with rheumatoid factor, unspecified: Secondary | ICD-10-CM | POA: Diagnosis not present

## 2022-10-21 DIAGNOSIS — Z6832 Body mass index (BMI) 32.0-32.9, adult: Secondary | ICD-10-CM | POA: Diagnosis not present

## 2022-10-21 DIAGNOSIS — E669 Obesity, unspecified: Secondary | ICD-10-CM | POA: Diagnosis not present

## 2022-10-21 DIAGNOSIS — M1991 Primary osteoarthritis, unspecified site: Secondary | ICD-10-CM | POA: Diagnosis not present

## 2022-10-21 DIAGNOSIS — M79642 Pain in left hand: Secondary | ICD-10-CM | POA: Diagnosis not present

## 2022-10-21 DIAGNOSIS — M79671 Pain in right foot: Secondary | ICD-10-CM | POA: Diagnosis not present

## 2022-10-21 DIAGNOSIS — M79641 Pain in right hand: Secondary | ICD-10-CM | POA: Diagnosis not present

## 2022-10-21 DIAGNOSIS — R5383 Other fatigue: Secondary | ICD-10-CM | POA: Diagnosis not present

## 2022-10-28 ENCOUNTER — Telehealth: Payer: Self-pay | Admitting: *Deleted

## 2022-10-28 NOTE — Telephone Encounter (Signed)
Patient wanting to speak to you bout his cpap medication. Please advise

## 2022-10-28 NOTE — Telephone Encounter (Signed)
Return Call: lmtcb

## 2022-11-03 NOTE — Telephone Encounter (Signed)
LMTCB

## 2022-11-03 NOTE — Telephone Encounter (Signed)
Patient returned a call to me. He states that he is in  need of a need BIPAP device. He was informed that before he can get the replacement he will need to have a current face to face office visit with Dr Claiborne Billings. Patient agrees with the plan and will call the scheduler to get office appointment scheduled.

## 2022-11-04 DIAGNOSIS — Z7901 Long term (current) use of anticoagulants: Secondary | ICD-10-CM | POA: Diagnosis not present

## 2022-11-04 DIAGNOSIS — R7309 Other abnormal glucose: Secondary | ICD-10-CM | POA: Diagnosis not present

## 2022-11-04 DIAGNOSIS — E7800 Pure hypercholesterolemia, unspecified: Secondary | ICD-10-CM | POA: Diagnosis not present

## 2022-11-04 DIAGNOSIS — I4891 Unspecified atrial fibrillation: Secondary | ICD-10-CM | POA: Diagnosis not present

## 2022-11-04 DIAGNOSIS — N400 Enlarged prostate without lower urinary tract symptoms: Secondary | ICD-10-CM | POA: Diagnosis not present

## 2022-11-04 DIAGNOSIS — I10 Essential (primary) hypertension: Secondary | ICD-10-CM | POA: Diagnosis not present

## 2022-11-04 DIAGNOSIS — Z23 Encounter for immunization: Secondary | ICD-10-CM | POA: Diagnosis not present

## 2022-11-11 ENCOUNTER — Encounter (HOSPITAL_COMMUNITY): Payer: Self-pay | Admitting: Nurse Practitioner

## 2022-11-11 ENCOUNTER — Ambulatory Visit (HOSPITAL_COMMUNITY)
Admission: RE | Admit: 2022-11-11 | Discharge: 2022-11-11 | Disposition: A | Discharge status: Home / Self Care | Payer: Medicare HMO | Source: Ambulatory Visit | Attending: Nurse Practitioner | Admitting: Nurse Practitioner

## 2022-11-11 VITALS — BP 148/80 | HR 48 | Ht 72.0 in | Wt 236.0 lb

## 2022-11-11 DIAGNOSIS — I4891 Unspecified atrial fibrillation: Secondary | ICD-10-CM | POA: Diagnosis not present

## 2022-11-11 DIAGNOSIS — D6869 Other thrombophilia: Secondary | ICD-10-CM

## 2022-11-11 MED ORDER — AMIODARONE HCL 200 MG PO TABS: ORAL_TABLET | ORAL | Status: DC

## 2022-11-11 NOTE — Addendum Note (Signed)
Encounter addended by: Sherran Needs, NP on: 11/11/2022 1:21 PM  Actions taken: Clinical Note Signed

## 2022-11-11 NOTE — Progress Notes (Addendum)
Primary Care Physician: Deland Pretty, MD Referring Physician: Dr. Claiborne Billings EP: Dr. Cristi Loron is a 71 y.o. male with a h/o afib that had been quiet on amiodarone for years. One year ago because of abstinence of afib, amio was reduced to 100 mg daily. He recently returned to afib in the last 1-2 days. He took a whole amio 200 mg tab this am. He is rate controlled. No recent illness or change in lifestyle. He is not using cpap. No alcohol, 2-3 cups of coffee daily. No tobacco use but he has started drinking 2 energy drinks a day. He is on xarelto 20 mg daily.   F/u in afib clinic. He continues to be in afib rate controlled. He is reloading on amio 200 mg 2x a day and we discussed pursing cardioversion in a couple of weeks, he is in agreement. He is using cpap now and has stopped energy drinks. No missed xarelto.   F/u 04/10/22. He had a successful cardioversion and remains in a sinus brady at 48 bpm. He feels tired often, he is using cpap. I question if the amio/brady is making him feel sluggish. We discussed an ablation today as a means to stop amio and to allow HR to drift upward. He is not totally convinced he wants to go thru with it but wants to discuss with Dr. Claiborne Billings on appointment  6/6 and I will ask for appointment with EP for him to further discuss. Echo will need to be updated.   F/u in afib clinic, 11/11/22. He had an afib ablation one month ago and is doing well staying in [place]. No swallowing or groin issues. EKG shows sinus brady today,   Today, he denies symptoms of palpitations, chest pain, shortness of breath, orthopnea, PND, lower extremity edema, dizziness, presyncope, syncope, or neurologic sequela. The patient is tolerating medications without difficulties and is otherwise without complaint today.   Past Medical History:  Diagnosis Date   Anxiety    Arthritis    Atrial fibrillation (HCC)    Benign essential tremor    no meds taken   Coronary artery disease     history of Atrial Fib.   Depression    GERD (gastroesophageal reflux disease)    History of kidney stones    Hyperlipidemia    Hypertension    Low testosterone    Palpitations    Prostate cancer (HCC)    Rheumatoid arthritis ([place])    Sleep apnea    has CPAP but not worn in 6 months   Past Surgical History:  Procedure Laterality Date   ATRIAL FIBRILLATION ABLATION N/A 10/07/2022   Procedure: ATRIAL FIBRILLATION ABLATION;  Surgeon: Vickie Epley, MD;  Location: [facility];  Service: Cardiovascular;  Laterality: N/A;   BACK SURGERY  2010   lower   CARDIAC CATHETERIZATION  05/14/2004   Contiued medical therapy   CARDIOVASCULAR STRESS TEST  09/19/2009   Observed defect is consistent with diaphragmatic attentuation. EKG negative for ischemia.   CARDIOVERSION N/A 11/20/2014   Procedure: CARDIOVERSION;  Surgeon: Troy Sine, MD;  Location: [facility] ENDOSCOPY;  Service: Cardiovascular;  Laterality: N/A;   CARDIOVERSION N/A 02/19/2016   Procedure: CARDIOVERSION;  Surgeon: Troy Sine, MD;  Location: [facility] ENDOSCOPY;  Service: Cardiovascular;  Laterality: N/A;   CARDIOVERSION N/A 03/26/2022   Procedure: CARDIOVERSION;  Surgeon: Janina Mayo, MD;  Location: [facility] ENDOSCOPY;  Service: Cardiovascular;  Laterality: N/A;   CERVICAL FUSION  2005  CYSTOSCOPY N/A 10/25/2020   Procedure: CYSTOSCOPY;  Surgeon: Irine Seal, MD;  Location: [facility];  Service: Urology;  Laterality: N/A;   EXTRACORPOREAL SHOCK WAVE LITHOTRIPSY Right 05/09/2019   Procedure: EXTRACORPOREAL SHOCK WAVE LITHOTRIPSY (ESWL);  Surgeon: Festus Aloe, MD;  Location: [facility];  Service: Urology;  Laterality: Right;   [place], 2007   stent insertion, stone basket    PROSTATE BIOPSY  2021   RADIOACTIVE SEED IMPLANT N/A 10/25/2020   Procedure: RADIOACTIVE SEED IMPLANT/BRACHYTHERAPY IMPLANT;  Surgeon: Irine Seal, MD;  Location: [facility];  Service: Urology;  Laterality: N/A;    RESECTION DISTAL CLAVICAL Right 07/06/2019   Procedure: OPEN DISTAL CLAVICLE EXCISION;  Surgeon: Earlie Server, MD;  Location: [place];  Service: Orthopedics;  Laterality: Right;   SHOULDER ARTHROSCOPY WITH ROTATOR CUFF REPAIR AND SUBACROMIAL DECOMPRESSION Right 07/06/2019   Procedure: SHOULDER ARTHROSCOPY WITH OPEN ROTATOR CUFF REPAIR, SUBACROMIAL DECOMPRESSION WITH EXTENSIVE DEBRIDEMENT;  Surgeon: Earlie Server, MD;  Location: [place];  Service: Orthopedics;  Laterality: Right;   SHOULDER ARTHROSCOPY WITH ROTATOR CUFF REPAIR AND SUBACROMIAL DECOMPRESSION Left 04/24/2021   Procedure: SHOULDER ARTHROSCOPY WITH  SUBACROMIAL DECOMPRESSION;  Surgeon: Earlie Server, MD;  Location: [place];  Service: Orthopedics;  Laterality: Left;   SHOULDER OPEN ROTATOR CUFF REPAIR Left 04/24/2021   Procedure: OPEN ROTATOR CUFF REPAIR SHOULDER DISTAL CLAVICLE RESECTION;  Surgeon: Earlie Server, MD;  Location: [place];  Service: Orthopedics;  Laterality: Left;   SPACE OAR INSTILLATION N/A 10/25/2020   Procedure: SPACE OAR INSTILLATION;  Surgeon: Irine Seal, MD;  Location: [facility];  Service: Urology;  Laterality: N/A;   TONSILLECTOMY     as a child   TRANSTHORACIC ECHOCARDIOGRAM  03/29/2008   EF 67%, mild mitral and tricuspid valve regurg.    Current Outpatient Medications  Medication Sig Dispense Refill   acetaminophen (TYLENOL) 650 MG CR tablet Take 1,300 mg by mouth every 8 (eight) hours as needed for pain.     celecoxib (CELEBREX) 200 MG capsule Take 200 mg by mouth every evening.     ezetimibe (ZETIA) 10 MG tablet TAKE 1 TABLET EVERY DAY. MUST KEEP APPOINTMENT FOR FUTURE REFILLS 90 tablet 1   fexofenadine (ALLEGRA) 180 MG tablet Take 180 mg by mouth at bedtime.      fluticasone (FLONASE) 50 MCG/ACT nasal spray Place 1 spray into both nostrils daily as needed for allergies or rhinitis.     ketotifen (ZADITOR) 0.025 %  ophthalmic solution Place 1 drop into both eyes 2 (two) times daily as needed (allergies).     losartan (COZAAR) 50 MG tablet TAKE 1 TABLET TWICE DAILY 180 tablet 1   Multiple Vitamin (MULTIVITAMIN ADULT PO) Take 1 tablet by mouth daily.     oxyCODONE (OXY IR/ROXICODONE) 5 MG immediate release tablet Take one tab po q4-6hrs prn pain, may need 1-2 first couple days 40 tablet 0   rosuvastatin (CRESTOR) 20 MG tablet TAKE 1 TABLET EVERY DAY (Patient taking differently: Take 20 mg by mouth every evening.) 90 tablet 2   sertraline (ZOLOFT) 100 MG tablet Take 100 mg by mouth daily.     tamsulosin (FLOMAX) 0.4 MG CAPS capsule Take 1 capsule (0.4 mg total) by mouth daily. 90 capsule 3   XARELTO 20 MG TABS tablet TAKE 1 TABLET DAILY WITH SUPPER 90 tablet 3   amiodarone (PACERONE) 200 MG tablet Take 1 tablet by mouth once daily  No current facility-administered medications for this encounter.    Allergies  Allergen Reactions   Adalimumab Itching   Atorvastatin     Other Reaction(s): muscle weakness   Penicillins     Childhood allergy    Social History   Socioeconomic History   Marital status: Widowed    Spouse name: Not on file   Number of children: Not on file   Years of education: Not on file   Highest education level: Not on file  Occupational History   Occupation: retired    Comment: fed ex  Tobacco Use   Smoking status: Former    Packs/day: 0.25    Years: 40.00    Total pack years: 10.00    Types: Cigarettes    Quit date: 01/25/2004    Years since quitting: 18.8   Smokeless tobacco: Never  Vaping Use   Vaping Use: Never used  Substance and Sexual Activity   Alcohol use: Not Currently    Alcohol/week: 4.0 standard drinks of alcohol    Types: 4 Standard drinks or equivalent per week    Comment: couple times a month   Drug use: No   Sexual activity: Not Currently  Other Topics Concern   Not on file  Social History Narrative   One of nine siblings (6 girls and 3 boys).  Lost wife to cancer of the sinus approximately ten years ago (2011).    Social Determinants of Health   Financial Resource Strain: Not on file  Food Insecurity: No Food Insecurity (10/08/2022)   Hunger Vital Sign    Worried About Running Out of Food in the Last Year: Never true    Ran Out of Food in the Last Year: Never true  Transportation Needs: No Transportation Needs (10/08/2022)   PRAPARE - Hydrologist (Medical): No    Lack of Transportation (Non-Medical): No  Physical Activity: Not on file  Stress: Not on file  Social Connections: Not on file  Intimate Partner Violence: Not At Risk (10/08/2022)   Humiliation, Afraid, Rape, and Kick questionnaire    Fear of Current or Ex-Partner: No    Emotionally Abused: No    Physically Abused: No    Sexually Abused: No    Family History  Problem Relation Age of Onset   Diabetes Mother    Kidney failure Mother    Heart disease Mother    Prostate cancer Father    Alzheimer's disease Father    Brain cancer Sister    Lung cancer Brother    Heart disease Sister    Tremor Daughter    Alopecia Daughter     ROS- All systems are reviewed and negative except as per the HPI above  Physical Exam: Vitals:   11/11/22 1113  BP: (!) 148/80  Pulse: (!) 48  Weight: 107 kg  Height: 6' (1.829 m)   Wt Readings from Last 3 Encounters:  11/11/22 107 kg  10/07/22 102.1 kg  08/05/22 103.9 kg    Labs: Lab Results  Component Value Date   NA 144 09/19/2022   K 4.5 09/19/2022   CL 105 09/19/2022   CO2 26 09/19/2022   GLUCOSE 98 09/19/2022   BUN 34 (H) 09/19/2022   CREATININE 1.25 09/19/2022   CALCIUM 9.3 09/19/2022   MG 1.7 12/10/2015   Lab Results  Component Value Date   INR 1.0 10/22/2020   Lab Results  Component Value Date   CHOL 258 (H) 10/22/2020   HDL 45  10/22/2020   LDLCALC 196 (H) 10/22/2020   TRIG 84 10/22/2020     GEN- The patient is well appearing, alert and oriented x 3 today.    Head- normocephalic, atraumatic Eyes-  Sclera clear, conjunctiva pink Ears- hearing intact Oropharynx- clear Neck- supple, no JVP Lymph- no cervical lymphadenopathy Lungs- Clear to ausculation bilaterally, normal work of breathing Heart- regular rate and rhythm, no murmurs, rubs or gallops, PMI not laterally displaced GI- soft, NT, ND, + BS Extremities- no clubbing, cyanosis, or edema MS- no significant deformity or atrophy Skin- no rash or lesion Psych- euthymic mood, full affect Neuro- strength and sensation are intact  EKG- Vent. rate 48 BPM PR interval 188 ms QRS duration 94 ms QT/QTcB 476/425 ms P-R-T axes 56 13 10 Sinus bradycardia with sinus arrhythmia Cannot rule out Anterior infarct , age undetermined Abnormal ECG When compared with ECG of 08-Oct-2022 05:25, PREVIOUS ECG IS PRESENT  Echo -2015- Left ventricle: The cavity size was normal. Wall thickness    was increased in a pattern of mild LVH. Systolic function    was normal. The estimated ejection fraction was in the    range of 50% to 55%.  - Mitral valve: Mild to moderate regurgitation.  - Left atrium: The atrium was mildly dilated.  - Right atrium: The atrium was mildly dilated.   Assessment and Plan:  1. Afib S/p ablation  and is staying in [place].  He is bradycardic but asymptomatic, usually runs slow  Continue amiodarone 200 mg daily  2. CHA2DS2VASc  score of 2 Continue xarelto 20 mg daily,  reminded no interruption in taking for the next 2 month recovery period   F/u with Dr. Quentin Ore 01/14/22  Geroge Baseman. Teela Narducci, [facility] [address] [place], [place] [address] [phone]

## 2022-11-12 DIAGNOSIS — R3915 Urgency of urination: Secondary | ICD-10-CM | POA: Diagnosis not present

## 2022-11-12 DIAGNOSIS — N401 Enlarged prostate with lower urinary tract symptoms: Secondary | ICD-10-CM | POA: Diagnosis not present

## 2022-11-12 DIAGNOSIS — R3912 Poor urinary stream: Secondary | ICD-10-CM | POA: Diagnosis not present

## 2022-11-12 DIAGNOSIS — Z8546 Personal history of malignant neoplasm of prostate: Secondary | ICD-10-CM | POA: Diagnosis not present

## 2022-11-12 DIAGNOSIS — R3121 Asymptomatic microscopic hematuria: Secondary | ICD-10-CM | POA: Diagnosis not present

## 2022-11-26 DIAGNOSIS — E669 Obesity, unspecified: Secondary | ICD-10-CM | POA: Diagnosis not present

## 2022-11-26 DIAGNOSIS — M059 Rheumatoid arthritis with rheumatoid factor, unspecified: Secondary | ICD-10-CM | POA: Diagnosis not present

## 2022-11-26 DIAGNOSIS — M79642 Pain in left hand: Secondary | ICD-10-CM | POA: Diagnosis not present

## 2022-11-26 DIAGNOSIS — M79641 Pain in right hand: Secondary | ICD-10-CM | POA: Diagnosis not present

## 2022-11-26 DIAGNOSIS — M1991 Primary osteoarthritis, unspecified site: Secondary | ICD-10-CM | POA: Diagnosis not present

## 2022-11-26 DIAGNOSIS — Z6831 Body mass index (BMI) 31.0-31.9, adult: Secondary | ICD-10-CM | POA: Diagnosis not present

## 2022-11-26 DIAGNOSIS — M79671 Pain in right foot: Secondary | ICD-10-CM | POA: Diagnosis not present

## 2022-12-10 DIAGNOSIS — Z85828 Personal history of other malignant neoplasm of skin: Secondary | ICD-10-CM | POA: Diagnosis not present

## 2022-12-10 DIAGNOSIS — Z86018 Personal history of other benign neoplasm: Secondary | ICD-10-CM | POA: Diagnosis not present

## 2022-12-10 DIAGNOSIS — L723 Sebaceous cyst: Secondary | ICD-10-CM | POA: Diagnosis not present

## 2022-12-10 DIAGNOSIS — L570 Actinic keratosis: Secondary | ICD-10-CM | POA: Diagnosis not present

## 2022-12-10 DIAGNOSIS — L820 Inflamed seborrheic keratosis: Secondary | ICD-10-CM | POA: Diagnosis not present

## 2022-12-10 DIAGNOSIS — L821 Other seborrheic keratosis: Secondary | ICD-10-CM | POA: Diagnosis not present

## 2022-12-10 DIAGNOSIS — L814 Other melanin hyperpigmentation: Secondary | ICD-10-CM | POA: Diagnosis not present

## 2022-12-10 DIAGNOSIS — L578 Other skin changes due to chronic exposure to nonionizing radiation: Secondary | ICD-10-CM | POA: Diagnosis not present

## 2022-12-11 DIAGNOSIS — G894 Chronic pain syndrome: Secondary | ICD-10-CM | POA: Diagnosis not present

## 2022-12-11 DIAGNOSIS — M25562 Pain in left knee: Secondary | ICD-10-CM | POA: Diagnosis not present

## 2022-12-11 DIAGNOSIS — M4712 Other spondylosis with myelopathy, cervical region: Secondary | ICD-10-CM | POA: Diagnosis not present

## 2022-12-11 DIAGNOSIS — M4714 Other spondylosis with myelopathy, thoracic region: Secondary | ICD-10-CM | POA: Diagnosis not present

## 2022-12-25 DIAGNOSIS — M059 Rheumatoid arthritis with rheumatoid factor, unspecified: Secondary | ICD-10-CM | POA: Diagnosis not present

## 2022-12-31 DIAGNOSIS — Z7901 Long term (current) use of anticoagulants: Secondary | ICD-10-CM | POA: Diagnosis not present

## 2022-12-31 DIAGNOSIS — I25118 Atherosclerotic heart disease of native coronary artery with other forms of angina pectoris: Secondary | ICD-10-CM | POA: Diagnosis not present

## 2022-12-31 DIAGNOSIS — F341 Dysthymic disorder: Secondary | ICD-10-CM | POA: Diagnosis not present

## 2022-12-31 DIAGNOSIS — I2510 Atherosclerotic heart disease of native coronary artery without angina pectoris: Secondary | ICD-10-CM | POA: Diagnosis not present

## 2022-12-31 DIAGNOSIS — R5381 Other malaise: Secondary | ICD-10-CM | POA: Diagnosis not present

## 2022-12-31 DIAGNOSIS — E118 Type 2 diabetes mellitus with unspecified complications: Secondary | ICD-10-CM | POA: Diagnosis not present

## 2022-12-31 DIAGNOSIS — N400 Enlarged prostate without lower urinary tract symptoms: Secondary | ICD-10-CM | POA: Diagnosis not present

## 2022-12-31 DIAGNOSIS — M069 Rheumatoid arthritis, unspecified: Secondary | ICD-10-CM | POA: Diagnosis not present

## 2022-12-31 DIAGNOSIS — I4891 Unspecified atrial fibrillation: Secondary | ICD-10-CM | POA: Diagnosis not present

## 2023-01-01 DIAGNOSIS — H43811 Vitreous degeneration, right eye: Secondary | ICD-10-CM | POA: Diagnosis not present

## 2023-01-01 DIAGNOSIS — H2513 Age-related nuclear cataract, bilateral: Secondary | ICD-10-CM | POA: Diagnosis not present

## 2023-01-01 DIAGNOSIS — H43393 Other vitreous opacities, bilateral: Secondary | ICD-10-CM | POA: Diagnosis not present

## 2023-01-01 DIAGNOSIS — H1045 Other chronic allergic conjunctivitis: Secondary | ICD-10-CM | POA: Diagnosis not present

## 2023-01-02 ENCOUNTER — Other Ambulatory Visit: Payer: Self-pay | Admitting: Cardiovascular Disease

## 2023-01-14 ENCOUNTER — Ambulatory Visit: Payer: Medicare HMO | Attending: Cardiology | Admitting: Cardiology

## 2023-01-14 ENCOUNTER — Encounter: Payer: Self-pay | Admitting: Cardiology

## 2023-01-14 VITALS — BP 140/82 | HR 84 | Ht 72.0 in | Wt 240.0 lb

## 2023-01-14 DIAGNOSIS — G4733 Obstructive sleep apnea (adult) (pediatric): Secondary | ICD-10-CM

## 2023-01-14 DIAGNOSIS — Z125 Encounter for screening for malignant neoplasm of prostate: Secondary | ICD-10-CM | POA: Diagnosis not present

## 2023-01-14 DIAGNOSIS — I4819 Other persistent atrial fibrillation: Secondary | ICD-10-CM

## 2023-01-14 DIAGNOSIS — Z79899 Other long term (current) drug therapy: Secondary | ICD-10-CM | POA: Diagnosis not present

## 2023-01-14 DIAGNOSIS — N400 Enlarged prostate without lower urinary tract symptoms: Secondary | ICD-10-CM | POA: Diagnosis not present

## 2023-01-14 DIAGNOSIS — E118 Type 2 diabetes mellitus with unspecified complications: Secondary | ICD-10-CM | POA: Diagnosis not present

## 2023-01-14 DIAGNOSIS — R5383 Other fatigue: Secondary | ICD-10-CM | POA: Diagnosis not present

## 2023-01-14 DIAGNOSIS — I25118 Atherosclerotic heart disease of native coronary artery with other forms of angina pectoris: Secondary | ICD-10-CM | POA: Diagnosis not present

## 2023-01-14 LAB — LAB REPORT - SCANNED
A1c: 6
EGFR: 61

## 2023-01-14 NOTE — Progress Notes (Deleted)
Electrophysiology Office Follow up Visit Note:    Date:  01/14/2023   ID:  Nathan Mcclure, DOB 19-Oct-1951, MRN [id number]  PCP:  Deland Pretty, MD  [facility] Cardiologist:  Skeet Latch, MD  [facility] Electrophysiologist:  Vickie Epley, MD    Interval History:    Nathan Mcclure is a 72 y.o. male who presents for a follow up visit.  He had an ablation October 07, 2022 during which the pulmonary veins were isolated.  He saw Butch Penny November 11, 2022.  At that appointment he reported maintaining sinus rhythm postoperatively.  At that time, he was still taking amiodarone 200 mg by mouth once daily.  He takes Xarelto for stroke prophylaxis.       Past Medical History:  Diagnosis Date   Anxiety    Arthritis    Atrial fibrillation (HCC)    Benign essential tremor    no meds taken   Coronary artery disease    history of Atrial Fib.   Depression    GERD (gastroesophageal reflux disease)    History of kidney stones    Hyperlipidemia    Hypertension    Low testosterone    Palpitations    Prostate cancer (HCC)    Rheumatoid arthritis ([place])    Sleep apnea    has CPAP but not worn in 6 months    Past Surgical History:  Procedure Laterality Date   ATRIAL FIBRILLATION ABLATION N/A 10/07/2022   Procedure: ATRIAL FIBRILLATION ABLATION;  Surgeon: Vickie Epley, MD;  Location: [place] [facility];  Service: Cardiovascular;  Laterality: N/A;   BACK SURGERY  2010   lower   CARDIAC CATHETERIZATION  05/14/2004   Contiued medical therapy   CARDIOVASCULAR STRESS TEST  09/19/2009   Observed defect is consistent with diaphragmatic attentuation. EKG negative for ischemia.   CARDIOVERSION N/A 11/20/2014   Procedure: CARDIOVERSION;  Surgeon: Troy Sine, MD;  Location: [facility] ENDOSCOPY;  Service: Cardiovascular;  Laterality: N/A;   CARDIOVERSION N/A 02/19/2016   Procedure: CARDIOVERSION;  Surgeon: Troy Sine, MD;  Location: [facility] ENDOSCOPY;  Service: Cardiovascular;   Laterality: N/A;   CARDIOVERSION N/A 03/26/2022   Procedure: CARDIOVERSION;  Surgeon: Janina Mayo, MD;  Location: [facility] ENDOSCOPY;  Service: Cardiovascular;  Laterality: N/A;   CERVICAL FUSION  2005   CYSTOSCOPY N/A 10/25/2020   Procedure: CYSTOSCOPY;  Surgeon: Irine Seal, MD;  Location: [facility];  Service: Urology;  Laterality: N/A;   EXTRACORPOREAL SHOCK WAVE LITHOTRIPSY Right 05/09/2019   Procedure: EXTRACORPOREAL SHOCK WAVE LITHOTRIPSY (ESWL);  Surgeon: Festus Aloe, MD;  [facility];  Service: Urology;  Laterality: Right;   [place], 2007   stent insertion, stone basket    PROSTATE BIOPSY  2021   RADIOACTIVE SEED IMPLANT N/A 10/25/2020   Procedure: RADIOACTIVE SEED IMPLANT/BRACHYTHERAPY IMPLANT;  Surgeon: Irine Seal, MD;  Location: [facility];  Service: Urology;  Laterality: N/A;   RESECTION DISTAL CLAVICAL Right 07/06/2019   Procedure: OPEN DISTAL CLAVICLE EXCISION;  Surgeon: Earlie Server, MD;  Location: [place];  Service: Orthopedics;  Laterality: Right;   SHOULDER ARTHROSCOPY WITH ROTATOR CUFF REPAIR AND SUBACROMIAL DECOMPRESSION Right 07/06/2019   Procedure: SHOULDER ARTHROSCOPY WITH OPEN ROTATOR CUFF REPAIR, SUBACROMIAL DECOMPRESSION WITH EXTENSIVE DEBRIDEMENT;  Surgeon: Earlie Server, MD;  Location: [place];  Service: Orthopedics;  Laterality: Right;   SHOULDER ARTHROSCOPY WITH ROTATOR CUFF REPAIR AND SUBACROMIAL DECOMPRESSION Left 04/24/2021   Procedure: SHOULDER ARTHROSCOPY  WITH  SUBACROMIAL DECOMPRESSION;  Surgeon: Earlie Server, MD;  Location: [place];  Service: Orthopedics;  Laterality: Left;   SHOULDER OPEN ROTATOR CUFF REPAIR Left 04/24/2021   Procedure: OPEN ROTATOR CUFF REPAIR SHOULDER DISTAL CLAVICLE RESECTION;  Surgeon: Earlie Server, MD;  Location: [place];  Service: Orthopedics;  Laterality: Left;   SPACE OAR INSTILLATION N/A 10/25/2020    Procedure: SPACE OAR INSTILLATION;  Surgeon: Irine Seal, MD;  Location: [facility];  Service: Urology;  Laterality: N/A;   TONSILLECTOMY     as a child   TRANSTHORACIC ECHOCARDIOGRAM  03/29/2008   EF 67%, mild mitral and tricuspid valve regurg.    Current Medications: No outpatient medications have been marked as taking for the 01/14/23 encounter (Appointment) with Vickie Epley, MD.     Allergies:   Adalimumab, Atorvastatin, and Penicillins   Social History   Socioeconomic History   Marital status: Widowed    Spouse name: Not on file   Number of children: Not on file   Years of education: Not on file   Highest education level: Not on file  Occupational History   Occupation: retired    Comment: fed ex  Tobacco Use   Smoking status: Former    Packs/day: 0.25    Years: 40.00    Total pack years: 10.00    Types: Cigarettes    Quit date: 01/25/2004    Years since quitting: 18.9   Smokeless tobacco: Never  Vaping Use   Vaping Use: Never used  Substance and Sexual Activity   Alcohol use: Not Currently    Alcohol/week: 4.0 standard drinks of alcohol    Types: 4 Standard drinks or equivalent per week    Comment: couple times a month   Drug use: No   Sexual activity: Not Currently  Other Topics Concern   Not on file  Social History Narrative   One of nine siblings (6 girls and 3 boys). Lost wife to cancer of the sinus approximately ten years ago (2011).    Social Determinants of Health   Financial Resource Strain: Not on file  Food Insecurity: No Food Insecurity (10/08/2022)   Hunger Vital Sign    Worried About Running Out of Food in the Last Year: Never true    Ran Out of Food in the Last Year: Never true  Transportation Needs: No Transportation Needs (10/08/2022)   PRAPARE - Hydrologist (Medical): No    Lack of Transportation (Non-Medical): No  Physical Activity: Not on file  Stress: Not on file  Social Connections:  Not on file     Family History: The patient's family history includes Alopecia in his daughter; Alzheimer's disease in his father; Brain cancer in his sister; Diabetes in his mother; Heart disease in his mother and sister; Kidney failure in his mother; Lung cancer in his brother; Prostate cancer in his father; Tremor in his daughter.  ROS:   Please see the history of present illness.    All other systems reviewed and are negative.  EKGs/Labs/Other Studies Reviewed:    The following studies were reviewed today:   EKG:  The ekg ordered today demonstrates ***  Recent Labs: 09/19/2022: BUN 34; Creatinine, Ser 1.25; Hemoglobin 13.6; Platelets 244; Potassium 4.5; Sodium 144  Recent Lipid Panel    Component Value Date/Time   CHOL 258 (H) 10/22/2020 1307   TRIG 84 10/22/2020 1307   HDL 45 10/22/2020 1307   CHOLHDL 5.7  10/22/2020 1307   VLDL 17 10/22/2020 1307   LDLCALC 196 (H) 10/22/2020 1307    Physical Exam:    VS:  There were no vitals taken for this visit.    Wt Readings from Last 3 Encounters:  11/11/22 236 lb (107 kg)  10/07/22 225 lb (102.1 kg)  08/05/22 229 lb (103.9 kg)     GEN: *** Well nourished, well developed in no acute distress CARDIAC: ***RRR, no murmurs, rubs, gallops PSYCHIATRIC:  Normal affect        ASSESSMENT:    No diagnosis found. PLAN:    In order of problems listed above:   #Persistent atrial fibrillation Doing well after his October 2023 ablation.  Maintaining sinus rhythm.  Given that he has maintained sinus rhythm thus far, I think we are at a point where we can stop amiodarone.  He does not need to wean this off.  He can to stop today.  Continue Xarelto.  #Obstructive sleep apnea CPAP use encouraged    Plan for A-fib clinic follow-up in 6 months.    Medication Adjustments/Labs and Tests Ordered: Current medicines are reviewed at length with the patient today.  Concerns regarding medicines are outlined above.  No orders of the  defined types were placed in this encounter.  No orders of the defined types were placed in this encounter.    Signed, Lars Mage, MD, [facility], [facility] 01/14/2023 5:01 AM    Electrophysiology [facility]

## 2023-01-14 NOTE — Patient Instructions (Signed)
Medication Instructions:  Your physician has recommended you make the following change in your medication:  1) STOP taking amiodarone  *If you need a refill on your cardiac medications before your next appointment, please call your pharmacy*  Follow-Up: At [facility], you and your health needs are our priority.  As part of our continuing mission to provide you with exceptional heart care, we have created designated Provider Care Teams.  These Care Teams include your primary Cardiologist (physician) and Advanced Practice Providers (APPs -  Physician Assistants and Nurse Practitioners) who all work together to provide you with the care you need, when you need it.  Your next appointment:   6 month(s)  Provider:   You will see one of the following Advanced Practice Providers on your designated Care Team:   Tommye Standard, [place]" [place], [place], NP

## 2023-01-14 NOTE — Progress Notes (Signed)
Electrophysiology Office Follow up Visit Note:    Date:  01/14/2023   ID:  Nathan Mcclure, DOB 1951-02-28, MRN [id number]  PCP:  Deland Pretty, MD  [facility] Cardiologist:  Skeet Latch, MD  [facility] Electrophysiologist:  Vickie Epley, MD    Interval History:    Nathan Mcclure is a 72 y.o. male who presents for a follow up visit.  He had an ablation October 07, 2022 during which the pulmonary veins were isolated.  He saw Butch Penny November 11, 2022.  At that appointment he reported maintaining sinus rhythm postoperatively.  At that time, he was still taking amiodarone 200 mg by mouth once daily.  He takes Xarelto for stroke prophylaxis.  Today, he states he has been feeling well. He remains compliant with amiodarone.   In clinic today his BP is elevated at 154/88, and improved to 140/82. At home his blood pressure has been averaging 140/80. He is more concerned about his low heart rates, with an average of 48 bpm at rest.  He is scheduled for a tooth extraction and implant soon. Advised that it was okay to hold Xarelto for this procedure.  Due to acid reflux he had been started on protonix (previously on prevacid).  He denies any palpitations, chest pain, shortness of breath, or peripheral edema. No lightheadedness, headaches, syncope, orthopnea, or PND.      Past Medical History:  Diagnosis Date   Anxiety    Arthritis    Atrial fibrillation (HCC)    Benign essential tremor    no meds taken   Coronary artery disease    history of Atrial Fib.   Depression    GERD (gastroesophageal reflux disease)    History of kidney stones    Hyperlipidemia    Hypertension    Low testosterone    Palpitations    Prostate cancer (HCC)    Rheumatoid arthritis ([place])    Sleep apnea    has CPAP but not worn in 6 months    Past Surgical History:  Procedure Laterality Date   ATRIAL FIBRILLATION ABLATION N/A 10/07/2022   Procedure: ATRIAL FIBRILLATION ABLATION;  Surgeon:  Vickie Epley, MD;  Location: [place] [facility];  Service: Cardiovascular;  Laterality: N/A;   BACK SURGERY  2010   lower   CARDIAC CATHETERIZATION  05/14/2004   Contiued medical therapy   CARDIOVASCULAR STRESS TEST  09/19/2009   Observed defect is consistent with diaphragmatic attentuation. EKG negative for ischemia.   CARDIOVERSION N/A 11/20/2014   Procedure: CARDIOVERSION;  Surgeon: Troy Sine, MD;  Location: [facility] ENDOSCOPY;  Service: Cardiovascular;  Laterality: N/A;   CARDIOVERSION N/A 02/19/2016   Procedure: CARDIOVERSION;  Surgeon: Troy Sine, MD;  Location: [facility] ENDOSCOPY;  Service: Cardiovascular;  Laterality: N/A;   CARDIOVERSION N/A 03/26/2022   Procedure: CARDIOVERSION;  Surgeon: Janina Mayo, MD;  Location: [facility] ENDOSCOPY;  Service: Cardiovascular;  Laterality: N/A;   CERVICAL FUSION  2005   CYSTOSCOPY N/A 10/25/2020   Procedure: CYSTOSCOPY;  Surgeon: Irine Seal, MD;  Location: [facility];  Service: Urology;  Laterality: N/A;   EXTRACORPOREAL SHOCK WAVE LITHOTRIPSY Right 05/09/2019   Procedure: EXTRACORPOREAL SHOCK WAVE LITHOTRIPSY (ESWL);  Surgeon: Festus Aloe, MD;  Location: [facility];  Service: Urology;  Laterality: Right;   [place], 2007   stent insertion, stone basket    PROSTATE BIOPSY  2021   RADIOACTIVE SEED IMPLANT N/A 10/25/2020   Procedure: RADIOACTIVE SEED IMPLANT/BRACHYTHERAPY IMPLANT;  Surgeon: Irine Seal, MD;  Location: [facility];  Service: Urology;  Laterality: N/A;   RESECTION DISTAL CLAVICAL Right 07/06/2019   Procedure: OPEN DISTAL CLAVICLE EXCISION;  Surgeon: Earlie Server, MD;  Location: [place];  Service: Orthopedics;  Laterality: Right;   SHOULDER ARTHROSCOPY WITH ROTATOR CUFF REPAIR AND SUBACROMIAL DECOMPRESSION Right 07/06/2019   Procedure: SHOULDER ARTHROSCOPY WITH OPEN ROTATOR CUFF REPAIR, SUBACROMIAL DECOMPRESSION WITH EXTENSIVE DEBRIDEMENT;  Surgeon: Earlie Server, MD;   Location: [place];  Service: Orthopedics;  Laterality: Right;   SHOULDER ARTHROSCOPY WITH ROTATOR CUFF REPAIR AND SUBACROMIAL DECOMPRESSION Left 04/24/2021   Procedure: SHOULDER ARTHROSCOPY WITH  SUBACROMIAL DECOMPRESSION;  Surgeon: Earlie Server, MD;  Location: [place];  Service: Orthopedics;  Laterality: Left;   SHOULDER OPEN ROTATOR CUFF REPAIR Left 04/24/2021   Procedure: OPEN ROTATOR CUFF REPAIR SHOULDER DISTAL CLAVICLE RESECTION;  Surgeon: Earlie Server, MD;  Location: [place];  Service: Orthopedics;  Laterality: Left;   SPACE OAR INSTILLATION N/A 10/25/2020   Procedure: SPACE OAR INSTILLATION;  Surgeon: Irine Seal, MD;  Location: [facility];  Service: Urology;  Laterality: N/A;   TONSILLECTOMY     as a child   TRANSTHORACIC ECHOCARDIOGRAM  03/29/2008   EF 67%, mild mitral and tricuspid valve regurg.    Current Medications: Current Meds  Medication Sig   acetaminophen (TYLENOL) 650 MG CR tablet Take 1,300 mg by mouth every 8 (eight) hours as needed for pain.   buPROPion (WELLBUTRIN XL) 150 MG 24 hr tablet Take 150 mg by mouth daily.   celecoxib (CELEBREX) 200 MG capsule Take 200 mg by mouth every evening.   ezetimibe (ZETIA) 10 MG tablet TAKE 1 TABLET EVERY DAY. MUST KEEP APPOINTMENT FOR FUTURE REFILLS   fexofenadine (ALLEGRA) 180 MG tablet Take 180 mg by mouth at bedtime.    fluticasone (FLONASE) 50 MCG/ACT nasal spray Place 1 spray into both nostrils daily as needed for allergies or rhinitis.   ketotifen (ZADITOR) 0.025 % ophthalmic solution Place 1 drop into both eyes 2 (two) times daily as needed (allergies).   leflunomide (ARAVA) 20 MG tablet Take 20 mg by mouth daily.   losartan (COZAAR) 50 MG tablet TAKE 1 TABLET TWICE DAILY   Multiple Vitamin (MULTIVITAMIN ADULT PO) Take 1 tablet by mouth daily.   oxyCODONE (OXY IR/ROXICODONE) 5 MG immediate release tablet Take one tab po q4-6hrs prn pain, may need 1-2 first  couple days   rosuvastatin (CRESTOR) 20 MG tablet TAKE 1 TABLET EVERY DAY (Patient taking differently: Take 20 mg by mouth every evening.)   tamsulosin (FLOMAX) 0.4 MG CAPS capsule Take 1 capsule (0.4 mg total) by mouth daily.   XARELTO 20 MG TABS tablet TAKE 1 TABLET DAILY WITH SUPPER   [DISCONTINUED] amiodarone (PACERONE) 200 MG tablet Take 1 tablet by mouth once daily     Allergies:   Adalimumab, Atorvastatin, and Penicillins   Social History   Socioeconomic History   Marital status: Widowed    Spouse name: Not on file   Number of children: Not on file   Years of education: Not on file   Highest education level: Not on file  Occupational History   Occupation: retired    Comment: [employer]  Tobacco Use   Smoking status: Former    Packs/day: 0.25    Years: 40.00    Total pack years: 10.00    Types: Cigarettes    Quit date: 01/25/2004    Years since  quitting: 18.9   Smokeless tobacco: Never  Vaping Use   Vaping Use: Never used  Substance and Sexual Activity   Alcohol use: Not Currently    Alcohol/week: 4.0 standard drinks of alcohol    Types: 4 Standard drinks or equivalent per week    Comment: couple times a month   Drug use: No   Sexual activity: Not Currently  Other Topics Concern   Not on file  Social History Narrative   One of nine siblings (6 girls and 3 boys). Lost wife to cancer of the sinus approximately ten years ago (2011).    Social Determinants of Health   Financial Resource Strain: Not on file  Food Insecurity: No Food Insecurity (10/08/2022)   Hunger Vital Sign    Worried About Running Out of Food in the Last Year: Never true    Ran Out of Food in the Last Year: Never true  Transportation Needs: No Transportation Needs (10/08/2022)   PRAPARE - Hydrologist (Medical): No    Lack of Transportation (Non-Medical): No  Physical Activity: Not on file  Stress: Not on file  Social Connections: Not on file     Family  History: The patient's family history includes Alopecia in his daughter; Alzheimer's disease in his father; Brain cancer in his sister; Diabetes in his mother; Heart disease in his mother and sister; Kidney failure in his mother; Lung cancer in his brother; Prostate cancer in his father; Tremor in his daughter.  ROS:   Please see the history of present illness.    All other systems reviewed and are negative.  EKGs/Labs/Other Studies Reviewed:    The following studies were reviewed today:   EKG:  The ekg ordered today demonstrates sinus rhythm  Recent Labs: 09/19/2022: BUN 34; Creatinine, Ser 1.25; Hemoglobin 13.6; Platelets 244; Potassium 4.5; Sodium 144   Recent Lipid Panel    Component Value Date/Time   CHOL 258 (H) 10/22/2020 1307   TRIG 84 10/22/2020 1307   HDL 45 10/22/2020 1307   CHOLHDL 5.7 10/22/2020 1307   VLDL 17 10/22/2020 1307   LDLCALC 196 (H) 10/22/2020 1307    Physical Exam:    VS:  BP (!) 140/82 (BP Location: Right Arm, Patient Position: Sitting, Cuff Size: Normal)   Pulse 84   Ht 6' (1.829 m)   Wt 240 lb (108.9 kg)   SpO2 96%   BMI 32.55 kg/m     Wt Readings from Last 3 Encounters:  01/14/23 240 lb (108.9 kg)  11/11/22 236 lb (107 kg)  10/07/22 225 lb (102.1 kg)     GEN: Well nourished, well developed in no acute distress CARDIAC: RRR, no murmurs, rubs, gallops PSYCHIATRIC:  Normal affect        ASSESSMENT:    1. Persistent atrial fibrillation ([place])   2. Encounter for long-term (current) use of high-risk medication   3. OSA (obstructive sleep apnea)    PLAN:    In order of problems listed above:   #Persistent atrial fibrillation Doing well after his October 2023 ablation.  Maintaining sinus rhythm.  Given that he has maintained sinus rhythm thus far, I think we are at a point where we can stop amiodarone.  He does not need to wean this off.  He can to stop today.  Continue Xarelto.  #Obstructive sleep apnea CPAP use  encouraged   Follow-up with APP in 6 months.  Medication Adjustments/Labs and Tests Ordered: Current medicines are reviewed at length  with the patient today.  Concerns regarding medicines are outlined above.   Orders Placed This Encounter  Procedures   EKG 12-Lead   No orders of the defined types were placed in this encounter.  I,Mathew Stumpf,acting as a Education administrator for Vickie Epley, MD.,have documented all relevant documentation on the behalf of Vickie Epley, MD,as directed by  Vickie Epley, MD while in the presence of Vickie Epley, MD.  I, Vickie Epley, MD, have reviewed all documentation for this visit. The documentation on 01/14/23 for the exam, diagnosis, procedures, and orders are all accurate and complete.   Signed, Lars Mage, MD, [facility], [facility] 01/14/2023 1:32 PM    Electrophysiology [facility]

## 2023-01-19 DIAGNOSIS — M47816 Spondylosis without myelopathy or radiculopathy, lumbar region: Secondary | ICD-10-CM | POA: Diagnosis not present

## 2023-01-21 DIAGNOSIS — I10 Essential (primary) hypertension: Secondary | ICD-10-CM | POA: Diagnosis not present

## 2023-01-21 DIAGNOSIS — F324 Major depressive disorder, single episode, in partial remission: Secondary | ICD-10-CM | POA: Diagnosis not present

## 2023-01-21 DIAGNOSIS — R7303 Prediabetes: Secondary | ICD-10-CM | POA: Diagnosis not present

## 2023-01-27 ENCOUNTER — Telehealth: Payer: Self-pay | Admitting: Cardiology

## 2023-01-27 NOTE — Telephone Encounter (Signed)
Looked into the patient chart and did not find the medication pantoprazole. [place] and communicated this information.

## 2023-01-27 NOTE — Telephone Encounter (Signed)
Pt c/o medication issue:  1. Name of Medication: pantoprazole   2. How are you currently taking this medication (dosage and times per day)? Not listed on his chart  and pharmacy dose not know the dose  3. Are you having a reaction (difficulty breathing--STAT)? no  4. What is your medication issue? [place] called to request a refill on the medication, but it is not currently listed. [phone]

## 2023-01-28 DIAGNOSIS — M47816 Spondylosis without myelopathy or radiculopathy, lumbar region: Secondary | ICD-10-CM | POA: Diagnosis not present

## 2023-01-28 DIAGNOSIS — G894 Chronic pain syndrome: Secondary | ICD-10-CM | POA: Diagnosis not present

## 2023-01-29 ENCOUNTER — Encounter (HOSPITAL_COMMUNITY): Payer: Self-pay | Admitting: *Deleted

## 2023-02-03 ENCOUNTER — Other Ambulatory Visit: Payer: Self-pay | Admitting: Student

## 2023-02-04 ENCOUNTER — Other Ambulatory Visit: Payer: Self-pay | Admitting: Orthopaedic Surgery

## 2023-02-04 DIAGNOSIS — M47816 Spondylosis without myelopathy or radiculopathy, lumbar region: Secondary | ICD-10-CM

## 2023-02-04 NOTE — Telephone Encounter (Signed)
Completed Course

## 2023-02-11 DIAGNOSIS — I10 Essential (primary) hypertension: Secondary | ICD-10-CM | POA: Diagnosis not present

## 2023-02-11 DIAGNOSIS — F324 Major depressive disorder, single episode, in partial remission: Secondary | ICD-10-CM | POA: Diagnosis not present

## 2023-02-13 ENCOUNTER — Encounter: Payer: Self-pay | Admitting: Cardiovascular Disease

## 2023-02-13 ENCOUNTER — Ambulatory Visit: Payer: Medicare HMO | Attending: Cardiovascular Disease | Admitting: Cardiovascular Disease

## 2023-02-13 VITALS — BP 142/78 | HR 62 | Ht 72.0 in | Wt 234.8 lb

## 2023-02-13 DIAGNOSIS — G4733 Obstructive sleep apnea (adult) (pediatric): Secondary | ICD-10-CM

## 2023-02-13 DIAGNOSIS — I2510 Atherosclerotic heart disease of native coronary artery without angina pectoris: Secondary | ICD-10-CM | POA: Diagnosis not present

## 2023-02-13 DIAGNOSIS — I480 Paroxysmal atrial fibrillation: Secondary | ICD-10-CM

## 2023-02-13 DIAGNOSIS — E785 Hyperlipidemia, unspecified: Secondary | ICD-10-CM

## 2023-02-13 DIAGNOSIS — Z7901 Long term (current) use of anticoagulants: Secondary | ICD-10-CM | POA: Diagnosis not present

## 2023-02-13 DIAGNOSIS — G4731 Primary central sleep apnea: Secondary | ICD-10-CM | POA: Diagnosis not present

## 2023-02-13 NOTE — Patient Instructions (Addendum)
Medication Instructions:  No changes  *If you need a refill on your cardiac medications before your next appointment, please call your pharmacy*   Lab Work:  Not needed   Testing/Procedures:  Not needed  Follow-Up: At [facility], you and your health needs are our priority.  As part of our continuing mission to provide you with exceptional heart care, we have created designated Provider Care Teams.  These Care Teams include your primary Cardiologist (physician) and Advanced Practice Providers (APPs -  Physician Assistants and Nurse Practitioners) who all work together to provide you with the care you need, when you need it.     Your next appointment:   12 month(s) sleep   The format for your next appointment:   In Person  Provider:   Dr Shelva Majestic    Other Instructions   pressure changes were done to your BI-PAP

## 2023-02-13 NOTE — Progress Notes (Signed)
Patient ID: Nathan Mcclure, male   DOB: 02-13-1951, 72 y.o.   MRN: [id number]       Primary M.D.: Nathan Mcclure  HPI: Nathan Mcclure is a 72 y.o. male who presents to the office for a 22 month followup cardiology/sleep evaluation.  [name] has a history of mild CAD initially found in 2000. His last cardiac catheterization in May 2005 revealed  mild nonobstructive CAD involving the LAD, intermediate, and RCA treated medically. He has a history of hyperlipidemia. He's been diagnosed with rheumatoid arthritis. He had done well with beta blocker therapy for many years but last year stopped taking this when his prescription had run out and he was seen as an add-on on 01/24/2014.  He was found to be in atrial fibrillation with ventricular rate at 110 beats per minute. He was restarted on low-dose metoprolol tartrate at 25 mg twice a day. Laboratory showed a TSH that was normal at 1.12. He had normal CMP profile. Creatinine was 0.85. He was started on Xarelto 20 mg with  normal hemoglobin and hematocrit. I further increased his metoprolol to 50 mg twice a day. An echo Doppler study  on 02/23/2014  showed an ejection fraction of 50-55%. There was mild to moderate mitral regurgitation. His left atrium measured 42 mm.  He was initially started on Propofenone150 mg every 8 hours and when attempted to be titrated to 225 mg 3 times a day he was unable to tolerate the increased dose of Propofenone due to nausea and reduced his dose back to 150 mg.   Due to concerns for significant obstructive sleep apnea with excessive daytime sleepiness he  underwent a sleep study which confirmed moderate sleep apnea with an AHI of 15.1.  He dropped his oxygen 87%.  There was heavy snoring.  He had a significant delay in ultimately undergoing a CPAP titration due to his canceling of the study, but ultimately a CPAP trial was done on 06/29/2014.  He was found to develop central apneic events in ultimately was switched to  BiPAP.  He was titrated up to an IPAP of 16 and EPAP of 12, but continued to have centrals during sleep-wake junctions.  He has an AirCurve 10 Auto ResMed BiPAP unit and his current settings are for minimum EPAP of 10 with a maximum IPAP of 20.  A download was obtained from October 16 through 11/04/2014.  He met compliance with days of use being 80% but days greater than 4 hours was only 27%.  He was averaging 3 hours and 43 minutes of use.  Upon further questioning, he states as the pressure would build up  he would develop significant leakage  around the seal in the mask and mask would essentially blow off his face.  For this reason, he has not been able to be compliant.  He brings his unit with him and mask to the office today for further evaluation and assessment. A review of his download indicates a significant increase leak at 97.6.  His AHI was 13.4.  He underwent an initial cardioversion  on 11/20/2014.  At that time, he converted with just 1 shock at 120 J to normal sinus rhythm.   When I saw him in 2016 he had not been compliant with his medications and was only intermittently using BiPAP 2 times per week.  Instead of  taking his metoprolol tartrate 75 mg twice a day he was only taking this 50 mg daily.  He had continued Xarelto  20 mg for anticoagulation.  He began to notice that he was feeling more chest fluttering.  He denied chest pain.  He denies PND or orthopnea.  He has been using [employer] for his DME company.  When I saw him in November 2016 he was in atrial fibrillation of questionable duration. His resting pulse was 101 and I increased his metoprolol to 75 mg twice a day. He has felt improved. He also has resumed using BiPAP therapy and admits that he feels so much better now that he has been using this on a daily basis.  When seen in December.  He was started on amiodarone 200 mg a day for 1 week and was then titrated to 2 mg twice a day.  He continued to be in atrial  fibrillation.  On 02/19/2016 he underwent successful DC cardioversion and required 3 shocks at 120, 150, and ultimately cardioverted at 200 J.  His postprocedure ECG showed sinus bradycardia.  Subsequently, he has been on a reduced dose of metoprolol at 50 mg twice a day but has continued to take the amiodarone 200 mg twice a day.  He omits to 100% compliance with CPAP use.  He continues to be on Xarelto for anticoagulation.  He admits to resting pulse is in the 40s to at times. When I saw for post cardioversion follow-up  I reduced his metoprolol from 50 mg twice a day to 25 mg twice a day.  I again discussed importance of continued treatment for his obstructive sleep apnea.    He developed a kidney stone and underwent successful lithotripsy and held Xarelto for the procedure.  He is still working at [employer].  He has moved into an apartment complex.  Over the past month, he admits to a 10 pound weight gain.  He is walking over 20,000 steps per day.  He denies chest pain or breakthrough atrial fibrillation.  He admits to 100% compliance with BiPAP therapy.  He was recently started on Humira for his rheumatoid arthritis and is followed by Dr. Trudie Mcclure at [facility] rheumatology.   When I  saw him in September 2018 he had stopped taking Xarelto 2 months previously due to concerns of getting into the "doughnut hole."  He was not aware of any recurrent episodes of atrial fibrillation.  He continued to take amiodarone 200 mg daily, losartan 50 mg daily, and doxasocin4 mg.  He icontinued to be on Crestor 20 mg for hyperlipidemia.  In May 2018.  Total cholesterol was 166, triglycerides 79, LDL 103.  LFTs were normal.  He was using  BiPAP with 100% compliance.  Based on his CHA2DS2-VASc score I recommended resumption of anticoagulation which he agreed and provided him with samples to initiate therapy.  He was evaluated in December 2019 by Nathan Deforest, PA-C for preoperative evaluation.  He has had issues with kidney stones.     I saw him  in December 2020.  Over the prior year he had a right shoulder rotator cuff tear required surgery.  He is now working for [employer] to soon retire again.  He is walking all day at work.  He denies any episodes of chest pain or awareness of recurrent atrial fibrillation.  He states that he had only been intermittently using BiPAP but a download was attempted to be obtained today which did not reveal any recent use.  [employer] is his DME company.  He has not had recent laboratory.    I saw him in  December 2021.  He had been diagnosed with prostate CA and had undergone brachytherapy and has had active seed implants.  He is followed by Dr. Jeffie Pollock.  He lives by himself.  He is now working at [employer].  His rhythm has been stable but he believes he may have experienced 1 brief short-lived episode of heart rate irregularity lasted 20 minutes.  At that time, he had not been using BiPAP and I strongly recommended resumption of therapy and change his mask to a ResMed AirFit N 30i mask.  I last saw hi,m on March 22, 2021. He denied episodes of chest pain.  He may require left shoulder surgery in the future with Dr. French Ana.  He is not aware of any recurrent episodes of atrial fibrillation.  He has been intermittently using BiPAP therapy but over the past month started to use BiPAP since March 14.  Download was obtained from March 2 until March 31 which showed resumption of usage on March 14.  His 95th percentile BiPAP pressure is 12.2/8.2 with a maximum average pressure of 12.603.6 and he has a minimum EPAP set at 7 with maximum potential IPAP of 25.  AHI is 2.4/h.  An Epworth Sleepiness Scale score was recalculated in the office today and this endorsed at 6, arguing against significant residual daytime sleepiness.    Since I last saw him, he has had issues with recurrent atrial fibrillation.  Apparently, he was having significant difficulty in getting an appointment to see me due to chewing  issues and apparently has established his primary cardiology care now with Dr. Oval Linsey.  He was ultimately referred to Dr. Lars Mage and on October 07, 2022 he underwent successful atrial fibrillation a ablation.  He recently saw Dr. Quentin Ore on January 14, 2023 for follow-up.  He was taking amiodarone 200 mg daily following his ablation and this was discontinued at that office visit.  He takes Xarelto for stroke prophylaxis.  Nathan Mcclure has continued to use his BiPAP therapy.  Seen his old and from 50.  It is no longer wireless.  Brought his BiPAP unit to the office today.  We interrogated his device.  His current settings are at a minimum EPAP pressure of 7, pressure support of 4, maximum IPAP pressure of 15.  On his most recent download from January 25 through February 13, 2023 average use was 6 hours and 4 minutes per night.  During that time, he had undergone operation did not use therapy from January 29 through February 5.  AHI was 2.9.  He had central apnea index of 2.0, obstructive apnea index of 0.5 and hypopnea index of 0.2.  He presents for follow-up evaluation.       Past Surgical History:  Procedure Laterality Date   ATRIAL FIBRILLATION ABLATION N/A 10/07/2022   Procedure: ATRIAL FIBRILLATION ABLATION;  Surgeon: Vickie Epley, MD;  Location: [place] [facility];  Service: Cardiovascular;  Laterality: N/A;   BACK SURGERY  2010   lower   CARDIAC CATHETERIZATION  05/14/2004   Contiued medical therapy   CARDIOVASCULAR STRESS TEST  09/19/2009   Observed defect is consistent with diaphragmatic attentuation. EKG negative for ischemia.   CARDIOVERSION N/A 11/20/2014   Procedure: CARDIOVERSION;  Surgeon: Troy Sine, MD;  Location: [facility] ENDOSCOPY;  Service: Cardiovascular;  Laterality: N/A;   CARDIOVERSION N/A 02/19/2016   Procedure: CARDIOVERSION;  Surgeon: Troy Sine, MD;  Location: [place];  Service: Cardiovascular;  Laterality: N/A;   CARDIOVERSION N/A 03/26/2022  Procedure: CARDIOVERSION;  Surgeon: Janina Mayo, MD;  Location: [facility] ENDOSCOPY;  Service: Cardiovascular;  Laterality: N/A;   CERVICAL FUSION  2005   CYSTOSCOPY N/A 10/25/2020   Procedure: CYSTOSCOPY;  Surgeon: Irine Seal, MD;  Location: [facility];  Service: Urology;  Laterality: N/A;   EXTRACORPOREAL SHOCK WAVE LITHOTRIPSY Right 05/09/2019   Procedure: EXTRACORPOREAL SHOCK WAVE LITHOTRIPSY (ESWL);  Surgeon: Festus Aloe, MD;  Location: [facility];  Service: Urology;  Laterality: Right;   [place], 2007   stent insertion, stone basket    PROSTATE BIOPSY  2021   RADIOACTIVE SEED IMPLANT N/A 10/25/2020   Procedure: RADIOACTIVE SEED IMPLANT/BRACHYTHERAPY IMPLANT;  Surgeon: Irine Seal, MD;  Location: [facility];  Service: Urology;  Laterality: N/A;   RESECTION DISTAL CLAVICAL Right 07/06/2019   Procedure: OPEN DISTAL CLAVICLE EXCISION;  Surgeon: Earlie Server, MD;  Location: [place];  Service: Orthopedics;  Laterality: Right;   SHOULDER ARTHROSCOPY WITH ROTATOR CUFF REPAIR AND SUBACROMIAL DECOMPRESSION Right 07/06/2019   Procedure: SHOULDER ARTHROSCOPY WITH OPEN ROTATOR CUFF REPAIR, SUBACROMIAL DECOMPRESSION WITH EXTENSIVE DEBRIDEMENT;  Surgeon: Earlie Server, MD;  Location: [place];  Service: Orthopedics;  Laterality: Right;   SHOULDER ARTHROSCOPY WITH ROTATOR CUFF REPAIR AND SUBACROMIAL DECOMPRESSION Left 04/24/2021   Procedure: SHOULDER ARTHROSCOPY WITH  SUBACROMIAL DECOMPRESSION;  Surgeon: Earlie Server, MD;  Location: [place];  Service: Orthopedics;  Laterality: Left;   SHOULDER OPEN ROTATOR CUFF REPAIR Left 04/24/2021   Procedure: OPEN ROTATOR CUFF REPAIR SHOULDER DISTAL CLAVICLE RESECTION;  Surgeon: Earlie Server, MD;  Location: [place];  Service: Orthopedics;  Laterality: Left;   SPACE OAR INSTILLATION N/A 10/25/2020   Procedure: SPACE OAR INSTILLATION;  Surgeon: Irine Seal, MD;  Location: [facility];  Service: Urology;  Laterality: N/A;   TONSILLECTOMY     as a child   TRANSTHORACIC ECHOCARDIOGRAM  03/29/2008   EF 67%, mild mitral and tricuspid valve regurg.    Allergies  Allergen Reactions   Adalimumab Itching   Atorvastatin     Other Reaction(s): muscle weakness   Penicillins     Childhood allergy    Current Outpatient Medications  Medication Sig Dispense Refill   acetaminophen (TYLENOL) 650 MG CR tablet Take 1,300 mg by mouth every 8 (eight) hours as needed for pain.     buPROPion (WELLBUTRIN XL) 150 MG 24 hr tablet Take 150 mg by mouth daily.     celecoxib (CELEBREX) 200 MG capsule Take 200 mg by mouth every evening.     clonazePAM (KLONOPIN) 0.5 MG tablet Take 0.5 mg by mouth 2 (two) times daily as needed.     ezetimibe (ZETIA) 10 MG tablet TAKE 1 TABLET EVERY DAY. MUST KEEP APPOINTMENT FOR FUTURE REFILLS 90 tablet 1   fexofenadine (ALLEGRA) 180 MG tablet Take 180 mg by mouth at bedtime.      fluticasone (FLONASE) 50 MCG/ACT nasal spray Place 1 spray into both nostrils daily as needed for allergies or rhinitis.     hydrochlorothiazide (HYDRODIURIL) 25 MG tablet Take 25 mg by mouth daily.     ketotifen (ZADITOR) 0.025 % ophthalmic solution Place 1 drop into both eyes 2 (two) times daily as needed (allergies).     levocetirizine (XYZAL ALLERGY 24HR) 5 MG tablet 1 tablet in the evening Orally Once a day for 30 day(s)     losartan (COZAAR) 50 MG tablet TAKE 1 TABLET TWICE DAILY 180 tablet 3  oxyCODONE (OXY IR/ROXICODONE) 5 MG immediate release tablet Take one tab po q4-6hrs prn pain, may need 1-2 first couple days 40 tablet 0   pantoprazole (PROTONIX) 40 MG tablet 1 tablet Orally Once a day     predniSONE (DELTASONE) 10 MG tablet Take by mouth.     rosuvastatin (CRESTOR) 20 MG tablet TAKE 1 TABLET EVERY DAY (Patient taking differently: Take 20 mg by mouth every evening.) 90 tablet 2   sertraline (ZOLOFT) 100 MG tablet 1 tablet  Orally Once a day for 90 days     tamsulosin (FLOMAX) 0.4 MG CAPS capsule Take 1 capsule (0.4 mg total) by mouth daily. 90 capsule 3   XARELTO 20 MG TABS tablet TAKE 1 TABLET DAILY WITH SUPPER 90 tablet 3   leflunomide (ARAVA) 20 MG tablet Take 20 mg by mouth daily.     Multiple Vitamin (MULTIVITAMIN ADULT PO) Take 1 tablet by mouth daily.     No current facility-administered medications for this visit.    Social History   Socioeconomic History   Marital status: Widowed    Spouse name: Not on file   Number of children: Not on file   Years of education: Not on file   Highest education level: Not on file  Occupational History   Occupation: retired    Comment: [employer]  Tobacco Use   Smoking status: Former    Packs/day: 0.25    Years: 40.00    Total pack years: 10.00    Types: Cigarettes    Quit date: 01/25/2004    Years since quitting: 19.0   Smokeless tobacco: Never  Vaping Use   Vaping Use: Never used  Substance and Sexual Activity   Alcohol use: Not Currently    Alcohol/week: 4.0 standard drinks of alcohol    Types: 4 Standard drinks or equivalent per week    Comment: couple times a month   Drug use: No   Sexual activity: Not Currently  Other Topics Concern   Not on file  Social History Narrative   One of nine siblings (6 girls and 3 boys). Lost wife to cancer of the sinus approximately ten years ago (2011).    Social Determinants of Health   Financial Resource Strain: Not on file  Food Insecurity: No Food Insecurity (10/08/2022)   Hunger Vital Sign    Worried About Running Out of Food in the Last Year: Never true    Ran Out of Food in the Last Year: Never true  Transportation Needs: No Transportation Needs (10/08/2022)   PRAPARE - Hydrologist (Medical): No    Lack of Transportation (Non-Medical): No  Physical Activity: Not on file  Stress: Not on file  Social Connections: Not on file  Intimate Partner Violence: Not At Risk  (10/08/2022)   Humiliation, Afraid, Rape, and Kick questionnaire    Fear of Current or Ex-Partner: No    Emotionally Abused: No    Physically Abused: No    Sexually Abused: No   Socially he is widowed for 6 years.  There is no tobacco use.  He had broken up with his fiance.  He retired at age 34  Family History  Problem Relation Age of Onset   Diabetes Mother    Kidney failure Mother    Heart disease Mother    Prostate cancer Father    Alzheimer's disease Father    Brain cancer Sister    Lung cancer Brother    Heart disease Sister  Tremor Daughter    Alopecia Daughter     ROS General: Negative; No fevers, chills, or night sweats;  HEENT: Negative; No changes in vision or hearing, sinus congestion, difficulty swallowing Pulmonary: Negative; No cough, wheezing, shortness of breath, hemoptysis Cardiovascular:  unaware of his atrial fibrillation; No chest pain, presyncope, syncope,  GI: Negative; No nausea, vomiting, diarrhea, or abdominal pain GU: Prostate CA, radioactive seed implantation Musculoskeletal: Positive for cervical and lumbar disc disease; right shoulder rotator cuff tear  Hematologic/Oncology: Negative; no easy bruising, bleeding Endocrine: Negative; no heat/cold intolerance; no diabetes Neuro: Negative; no changes in balance, headaches Skin: Negative; No rashes or skin lesions Psychiatric: Negative; No behavioral problems, depression Sleep: Positive for moderate complex obstructive sleep apnea on BiPAP with set up date August 16, 2014 [employer] was his DME company which has been brought out by [employer].    Other comprehensive 14 point system review is negative.  PE BP (!) 142/78 (BP Location: Left Arm, Patient Position: Sitting, Cuff Size: Normal)   Pulse 62   Ht 6' (1.829 m)   Wt 234 lb 12.8 oz (106.5 kg)   SpO2 95%   BMI 31.84 kg/m    Repeat blood pressure by me was 140/78.  Wt Readings from Last 3 Encounters:  02/13/23 234 lb 12.8 oz (106.5 kg)   01/14/23 240 lb (108.9 kg)  11/11/22 236 lb (107 kg)   General: Alert, oriented, no distress.  Skin: normal turgor, no rashes, warm and dry HEENT: Normocephalic, atraumatic. Pupils equal round and reactive to light; sclera anicteric; extraocular muscles intact;  Nose without nasal septal hypertrophy Mouth/Parynx benign; Mallinpatti scale 3 Neck: No JVD, no carotid bruits; normal carotid upstroke Lungs: clear to ausculatation and percussion; no wheezing or rales Chest wall: without tenderness to palpitation Heart: PMI not displaced, RRR, s1 s2 normal, 1/6 systolic murmur, no diastolic murmur, no rubs, gallops, thrills, or heaves Abdomen: soft, nontender; no hepatosplenomehaly, BS+; abdominal aorta nontender and not dilated by palpation. Back: no CVA tenderness Pulses 2+ Musculoskeletal: full range of motion, normal strength, no joint deformities Extremities: no clubbing cyanosis or edema, Homan's sign negative  Neurologic: grossly nonfocal; Cranial nerves grossly wnl Psychologic: Normal mood and affect   February 13, 2023 ECG (independently read by me): NSR at 62 with sinus arythmia; inferior T wave abnormality  March 22, 2021 ECG (independently read by me): Sinus bradycardia at 53; [facility]  November 28, 2020 ECG (independently read by me): Simnus bradycardia at 48; no ectopy, normal intervals  December 01, 2019 ECG (independently read by me): Sinus bradycardia 56 bpm.  Nonspecific ST changes.  QTc interval 430 ms  September 2018 ECG (independently read by me): sinus bradycardia at 46 bpm with sinus arrhythmia.  Normal intervals.  December 2017 ECG (independently read by me): Sinus bradycardia 52 bpm.  QTc interval 396 ms.  PR interval 168 ms.  No ectopy.  September 2017 ECG (independently read by me): Marked sinus bradycardia with a heart rate at 37 bpm.  PR interval 180 ms.  QTc interval 414 ms.  May 2017 ECG (independently read by me): Marked sinus bradycardia with a ventricular rate  of 37 bpm with sinus arrhythmia  March 2017 ECG (independently read by me): Marked sinus bradycardia 41 bpm.  Nondiagnostic T changes.  QTc interval 528 ms.  12/14/2015 ECG (independently read by me):  Atrial fibrillation at 90 bpm.  Nonspecific T changes.  QTc interval 442 ms.  November 12 2015 ECG (independently read by me): Atrial fibrillation  with a rapid ventricular response at 10 1 bpm.  Nonspecific ST-T changes.  11/09/2014 ECG (independently read by me): Atrial fibrillation with controlled ventricular response in the 70s.  QTc interval 407 ms.  June 2015 ECG (independently read by me): Atrial fibrillation with a ventricular rate at 100 beats per minute.  QTc interval 428 ms.  Nonspecific T changes  05/08/2014 ECG (independently read by me): atrial fibrillation at 94 beats per minute.  QTc interval 425 ms.  02/23/2014 ECG (independently read by me) atrial fibrillation at 98 beats per minute. Nonspecific ST-T changes   Prior 02/08/2014 ECG (independently read by me): Atrial fibrillation with a rapid ventricular response at 128 beats per minute with nonspecific ST-T changes.   LABS: Laboratory from [facility] from 05/21/2017 has been reviewed.  H/H 13.2/41.0.  C-reactive protein 1.8.  BUN 21, creatinine 0.96.  LFTs normal.     Latest Ref Rng & Units 09/19/2022    9:14 AM 03/26/2022    9:20 AM 10/22/2020   12:56 PM  BMP  Glucose 70 - 99 mg/dL 98  109  104   BUN 8 - 27 mg/dL 34  19  23   Creatinine 0.76 - 1.27 mg/dL 1.25  1.17  0.93   BUN/Creat Ratio 10 - 24 27     Sodium 134 - 144 mmol/L 144  143  144   Potassium 3.5 - 5.2 mmol/L 4.5  4.3  4.0   Chloride 96 - 106 mmol/L 105  110  109   CO2 20 - 29 mmol/L '26  26  25   '$ Calcium 8.6 - 10.2 mg/dL 9.3  8.9  8.8       Latest Ref Rng & Units 10/22/2020   12:56 PM 12/10/2015    1:30 PM 11/15/2014   11:41 AM  Hepatic Function  Total Protein 6.5 - 8.1 g/dL 7.1  6.7  6.9   Albumin 3.5 - 5.0 g/dL 3.9  4.1  4.2   AST 15 - 41  U/L '17  20  22   '$ ALT 0 - 44 U/L '14  19  21   '$ Alk Phosphatase 38 - 126 U/L 77  53  60   Total Bilirubin 0.3 - 1.2 mg/dL 0.9  0.7  0.8       Latest Ref Rng & Units 09/19/2022    9:14 AM 03/26/2022    9:20 AM 10/22/2020   12:56 PM  CBC  WBC 3.4 - 10.8 x10E3/uL 7.1  7.6  7.5   Hemoglobin 13.0 - 17.7 g/dL 13.6  12.9  13.4   Hematocrit 37.5 - 51.0 % 42.8  41.9  41.1   Platelets 150 - 450 x10E3/uL 244  265  230    Lab Results  Component Value Date   MCV 92 09/19/2022   MCV 93.7 03/26/2022   MCV 94.3 10/22/2020   Lab Results  Component Value Date   TSH 1.899 10/22/2020  No results found for: "HGBA1C"   Lipid Panel     Component Value Date/Time   CHOL 258 (H) 10/22/2020 1307   TRIG 84 10/22/2020 1307   HDL 45 10/22/2020 1307   CHOLHDL 5.7 10/22/2020 1307   VLDL 17 10/22/2020 1307   LDLCALC 196 (H) 10/22/2020 1307    RADIOLOGY: No results found.  IMPRESSION:  1. OSA (obstructive sleep apnea) on BiPAP   2. Complex sleep apnea syndrome   3. Paroxysmal atrial fibrillation ([place]): s/p AF ablation  10/07/2022   4. Coronary artery disease involving  native coronary artery of native heart without angina pectoris   5. Anticoagulated   6. Hyperlipidemia LDL goal <70      ASSESSMENT AND PLAN: Nathan Mcclure is a 72 year-old gentleman who has mild nonobstructive coronary disease. He developed atrial fibrillation after stopping his metoprolol in 2015.  An echo Doppler study confirmed an ejection fraction of 50-55% with mild-to-moderate mitral regurgitation and mild biatrial enlargement. Remotely he had been on propofenone and metoprolol.since his cardioversion in February 2017.  He has complex sleep apnea and had initiated BiPAP therapy on August 16, 2014.  At prior evaluations he had not been using therapy.  However at prior evaluation I stressed the importance of resumption of use particularly with reference to potential recurrence of atrial fibrillation.  Since March 04, 2021 he has  resumed treatment.  Therapy AHI is 2.4 and his 95th percentile pressure is 12.2/80 2 cm of water.  Since I last saw him 2 years ago, he has had recurrent atrial fibrillation and ultimately underwent atrial fibrillation ablation on October 07, 2022 by Dr. Lars Mage.  He was kept on amiodarone 20 mg daily until his most recent office visit of January 14, 2023 when that was discontinued.  He has continued on Xarelto 20 mg.  Presently he has been on Zetia 10 mg and rosuvastatin 20 mg for hyperlipidemia.  LDL cholesterol in September 2022 was 50.  Total cholesterol in March 2023 was 121.  His blood pressure today was mildly increased systolically at [id number].  He tells me intermittently he experiences some swelling particularly of his left foot.  He has been on losartan 50 mg twice a day in addition to hydralazine which he self reduced to 12.5 mg daily.  With his mild blood pressure elevation and edema I have suggested he can increase this back to 25 mg.  With reference to his BiPAP, he has been compliant particularly with his recurrent atrial fibrillation development.  I have made mild adjustments to his BiPAP unit today and will change his pressure support from 4 to 5 cm, keep his minimum EPAP at 7 but increases maximum IPAP to 16 cm of water.  Follow phis for new machine, a new ResMed AirSense 11 BiPAP unit was recently released.  We will try to range for him to get new BiPAP unit.  I will need to see him within 90 days following receiving his new unit for compliance assessment.  Will be following up with Dr. Oval Linsey Dr. Quentin Ore, and sees Dr. Shelia Media.     He is maintaining sinus rhythm he continues to be on low-dose amiodarone at 100 mg daily.  He is on Xarelto 20 mg for anticoagulation and has been without bleeding.  He has been on losartan 50 mg twice a day, for blood pressure control.  Blood pressure today was slightly increased at 138/82.  I discussed target blood pressure less than 130/80 with ideal less than  120/80.  He will monitor his blood pressure at home.  I reviewed recent lipid studies which showed a total cholesterol 148, triglycerides 105, VLDL 19, HDL 42 and LDL 87.  He has been on rosuvastatin 20 mg daily.  I have suggested the addition of Zetia 10 mg for target LDL less than 70.  He is followed by Dr. Sandrea Matte for his prostate CA and has seed implants.  In the future he may require left shoulder surgery with [facility].  I will see him in 6 months for reevaluation.   Jasson Siegmann A.  Claiborne Billings, MD, [facility]  02/13/2023 12:32 PM

## 2023-03-24 DIAGNOSIS — M1712 Unilateral primary osteoarthritis, left knee: Secondary | ICD-10-CM | POA: Diagnosis not present

## 2023-03-25 DIAGNOSIS — I4891 Unspecified atrial fibrillation: Secondary | ICD-10-CM | POA: Diagnosis not present

## 2023-03-25 DIAGNOSIS — Z0000 Encounter for general adult medical examination without abnormal findings: Secondary | ICD-10-CM | POA: Diagnosis not present

## 2023-03-25 DIAGNOSIS — Z7901 Long term (current) use of anticoagulants: Secondary | ICD-10-CM | POA: Diagnosis not present

## 2023-03-25 DIAGNOSIS — M069 Rheumatoid arthritis, unspecified: Secondary | ICD-10-CM | POA: Diagnosis not present

## 2023-03-25 DIAGNOSIS — N400 Enlarged prostate without lower urinary tract symptoms: Secondary | ICD-10-CM | POA: Diagnosis not present

## 2023-03-25 DIAGNOSIS — I25118 Atherosclerotic heart disease of native coronary artery with other forms of angina pectoris: Secondary | ICD-10-CM | POA: Diagnosis not present

## 2023-03-25 DIAGNOSIS — I10 Essential (primary) hypertension: Secondary | ICD-10-CM | POA: Diagnosis not present

## 2023-03-25 DIAGNOSIS — E118 Type 2 diabetes mellitus with unspecified complications: Secondary | ICD-10-CM | POA: Diagnosis not present

## 2023-03-25 DIAGNOSIS — E7800 Pure hypercholesterolemia, unspecified: Secondary | ICD-10-CM | POA: Diagnosis not present

## 2023-03-26 DIAGNOSIS — M47816 Spondylosis without myelopathy or radiculopathy, lumbar region: Secondary | ICD-10-CM | POA: Diagnosis not present

## 2023-03-26 DIAGNOSIS — M5106 Intervertebral disc disorders with myelopathy, lumbar region: Secondary | ICD-10-CM | POA: Diagnosis not present

## 2023-04-03 DIAGNOSIS — Z0100 Encounter for examination of eyes and vision without abnormal findings: Secondary | ICD-10-CM | POA: Diagnosis not present

## 2023-04-08 DIAGNOSIS — M47892 Other spondylosis, cervical region: Secondary | ICD-10-CM | POA: Diagnosis not present

## 2023-04-08 DIAGNOSIS — M542 Cervicalgia: Secondary | ICD-10-CM | POA: Diagnosis not present

## 2023-04-08 DIAGNOSIS — M5106 Intervertebral disc disorders with myelopathy, lumbar region: Secondary | ICD-10-CM | POA: Diagnosis not present

## 2023-04-08 DIAGNOSIS — M47816 Spondylosis without myelopathy or radiculopathy, lumbar region: Secondary | ICD-10-CM | POA: Diagnosis not present

## 2023-04-25 ENCOUNTER — Other Ambulatory Visit: Payer: Self-pay | Admitting: Cardiovascular Disease

## 2023-04-29 DIAGNOSIS — Z9109 Other allergy status, other than to drugs and biological substances: Secondary | ICD-10-CM | POA: Diagnosis not present

## 2023-05-06 ENCOUNTER — Other Ambulatory Visit: Payer: Self-pay | Admitting: Cardiovascular Disease

## 2023-05-13 ENCOUNTER — Encounter: Payer: Self-pay | Admitting: Registered Nurse

## 2023-05-13 DIAGNOSIS — N5201 Erectile dysfunction due to arterial insufficiency: Secondary | ICD-10-CM | POA: Diagnosis not present

## 2023-05-13 DIAGNOSIS — N401 Enlarged prostate with lower urinary tract symptoms: Secondary | ICD-10-CM | POA: Diagnosis not present

## 2023-05-13 DIAGNOSIS — C61 Malignant neoplasm of prostate: Secondary | ICD-10-CM | POA: Diagnosis not present

## 2023-05-13 DIAGNOSIS — Z8546 Personal history of malignant neoplasm of prostate: Secondary | ICD-10-CM | POA: Diagnosis not present

## 2023-05-13 DIAGNOSIS — R351 Nocturia: Secondary | ICD-10-CM | POA: Diagnosis not present

## 2023-05-13 DIAGNOSIS — N3941 Urge incontinence: Secondary | ICD-10-CM | POA: Diagnosis not present

## 2023-06-17 ENCOUNTER — Ambulatory Visit: Payer: Medicare HMO | Attending: Cardiovascular Disease | Admitting: Cardiovascular Disease

## 2023-06-17 ENCOUNTER — Encounter: Payer: Self-pay | Admitting: Cardiovascular Disease

## 2023-06-26 ENCOUNTER — Other Ambulatory Visit: Payer: Self-pay

## 2023-06-26 ENCOUNTER — Telehealth: Payer: Self-pay | Admitting: Cardiovascular Disease

## 2023-06-26 MED ORDER — RIVAROXABAN 20 MG PO TABS: ORAL_TABLET | ORAL | 1 refills | Status: DC

## 2023-06-26 NOTE — Telephone Encounter (Signed)
*  STAT* If patient is at the pharmacy, call can be transferred to refill team.   1. Which medications need to be refilled? (please list name of each medication and dose if known) XARELTO 20 MG TABS tablet   2. Which pharmacy/location (including street and city if local pharmacy) is medication to be sent to?  [id number] - [place], [place] - [address]    3. Do they need a 30 day or 90 day supply? 90   Patient has appt on 8/7

## 2023-06-26 NOTE — Telephone Encounter (Signed)
Prescription refill request for Xarelto received.  Indication:AFIB Last office visit:1/24 Weight:106.5  KG Age:72 Scr:1.2  7/5 CrCl:85.05  ML/MIN  Prescription refilled

## 2023-06-29 ENCOUNTER — Other Ambulatory Visit: Payer: Self-pay

## 2023-06-29 MED ORDER — RIVAROXABAN 20 MG PO TABS: ORAL_TABLET | ORAL | 1 refills | Status: DC

## 2023-06-29 NOTE — Telephone Encounter (Signed)
Xarelto refill sent in another encounter

## 2023-07-06 DIAGNOSIS — Z79899 Other long term (current) drug therapy: Secondary | ICD-10-CM | POA: Diagnosis not present

## 2023-07-06 DIAGNOSIS — M47816 Spondylosis without myelopathy or radiculopathy, lumbar region: Secondary | ICD-10-CM | POA: Diagnosis not present

## 2023-07-06 DIAGNOSIS — G894 Chronic pain syndrome: Secondary | ICD-10-CM | POA: Diagnosis not present

## 2023-07-06 DIAGNOSIS — M542 Cervicalgia: Secondary | ICD-10-CM | POA: Diagnosis not present

## 2023-07-06 DIAGNOSIS — M47892 Other spondylosis, cervical region: Secondary | ICD-10-CM | POA: Diagnosis not present

## 2023-07-06 DIAGNOSIS — Z79891 Long term (current) use of opiate analgesic: Secondary | ICD-10-CM | POA: Diagnosis not present

## 2023-07-07 DIAGNOSIS — M1712 Unilateral primary osteoarthritis, left knee: Secondary | ICD-10-CM | POA: Diagnosis not present

## 2023-07-07 DIAGNOSIS — M25562 Pain in left knee: Secondary | ICD-10-CM | POA: Diagnosis not present

## 2023-07-14 DIAGNOSIS — C44622 Squamous cell carcinoma of skin of right upper limb, including shoulder: Secondary | ICD-10-CM | POA: Diagnosis not present

## 2023-07-14 DIAGNOSIS — D485 Neoplasm of uncertain behavior of skin: Secondary | ICD-10-CM | POA: Diagnosis not present

## 2023-07-14 DIAGNOSIS — L570 Actinic keratosis: Secondary | ICD-10-CM | POA: Diagnosis not present

## 2023-07-21 ENCOUNTER — Other Ambulatory Visit: Payer: Self-pay

## 2023-07-21 MED ORDER — RIVAROXABAN 20 MG PO TABS: ORAL_TABLET | ORAL | 1 refills | Status: DC

## 2023-07-28 ENCOUNTER — Ambulatory Visit: Payer: Medicare HMO | Admitting: Podiatry

## 2023-07-28 DIAGNOSIS — M2041 Other hammer toe(s) (acquired), right foot: Secondary | ICD-10-CM | POA: Diagnosis not present

## 2023-07-28 DIAGNOSIS — L603 Nail dystrophy: Secondary | ICD-10-CM

## 2023-07-28 DIAGNOSIS — M19071 Primary osteoarthritis, right ankle and foot: Secondary | ICD-10-CM

## 2023-07-28 NOTE — Progress Notes (Signed)
  Subjective:  Patient ID: Nathan Mcclure, male    DOB: 1951/05/21,  MRN: [id number]  Chief Complaint  Patient presents with   Ankle Problem    "The swollen has been there for years but the toes are starting to hurt and the nails are getting thick and hard.     72 y.o. male presents with the above complaint. History confirmed with patient.   Objective:  Physical Exam: warm, good capillary refill, no trophic changes or ulcerative lesions, normal DP and PT pulses, normal sensory exam, and pain swelling and limited range of motion right first metatarsophalangeal joint, he has semirigid lesser hammertoes, dystrophic nails of lesser toenails.   Assessment:   1. 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 Hammertoe of right foot      Plan:  Patient was evaluated and treated and all questions answered.  We discussed his limited range of motion and joint pain at the first metatarsal phalangeal joint.  We discussed arthritic changes which were present on his last examination in 2016.  Long-term we discussed that surgical correction with first metatarsal phalangeal joint arthrodesis would likely offer him a long-term result.  I recommended corticosteroid injection today to alleviate this as he says he would like to would likely wait until winter for surgical intervention.  Following sterile prep with Betadine the right first metatarsophalangeal joint was injected with 10 mg of Kenalog and 4 mg of dexamethasone.  He tolerated this well.  We also discussed the presence of the hammertoe deformities and that these are likely causing the dystrophic nail formation.  We discussed treatment of this long-term likely would require surgical intervention as well which could be done at the same time as metatarsal phalangeal joint arthrodesis.  In the interim utilize silicone offloading pad.  No follow-ups on file.

## 2023-07-29 ENCOUNTER — Encounter (HOSPITAL_BASED_OUTPATIENT_CLINIC_OR_DEPARTMENT_OTHER): Payer: Self-pay | Admitting: Cardiovascular Disease

## 2023-07-29 ENCOUNTER — Ambulatory Visit (HOSPITAL_BASED_OUTPATIENT_CLINIC_OR_DEPARTMENT_OTHER): Payer: Medicare HMO | Admitting: Cardiovascular Disease

## 2023-07-29 VITALS — BP 154/72 | HR 64 | Ht 72.0 in | Wt 229.8 lb

## 2023-07-29 DIAGNOSIS — D485 Neoplasm of uncertain behavior of skin: Secondary | ICD-10-CM | POA: Diagnosis not present

## 2023-07-29 DIAGNOSIS — I480 Paroxysmal atrial fibrillation: Secondary | ICD-10-CM | POA: Diagnosis not present

## 2023-07-29 DIAGNOSIS — I2510 Atherosclerotic heart disease of native coronary artery without angina pectoris: Secondary | ICD-10-CM

## 2023-07-29 DIAGNOSIS — I10 Essential (primary) hypertension: Secondary | ICD-10-CM

## 2023-07-29 DIAGNOSIS — L820 Inflamed seborrheic keratosis: Secondary | ICD-10-CM | POA: Diagnosis not present

## 2023-07-29 DIAGNOSIS — C44622 Squamous cell carcinoma of skin of right upper limb, including shoulder: Secondary | ICD-10-CM | POA: Diagnosis not present

## 2023-07-29 MED ORDER — RIVAROXABAN 20 MG PO TABS: ORAL_TABLET | ORAL | 5 refills | Status: DC

## 2023-07-29 MED ORDER — VALSARTAN-HYDROCHLOROTHIAZIDE 320-25 MG PO TABS: 1.0000 | ORAL_TABLET | Freq: Every day | ORAL | 30 refills | Status: DC

## 2023-07-29 MED ORDER — RIVAROXABAN 20 MG PO TABS: ORAL_TABLET | ORAL | 1 refills | Status: DC

## 2023-07-29 NOTE — Patient Instructions (Signed)
Medication Instructions:  STOP LOSARTAN   STOP HYDROCHLOROTHIAZIDE   START VALSARTAN-HCT 320-25 MG DAILY   *If you need a refill on your cardiac medications before your next appointment, please call your pharmacy*  Lab Work: NONE  Testing/Procedures: NONE  Follow-Up: At [facility], you and your health needs are our priority.  As part of our continuing mission to provide you with exceptional heart care, we have created designated Provider Care Teams.  These Care Teams include your primary Cardiologist (physician) and Advanced Practice Providers (APPs -  Physician Assistants and Nurse Practitioners) who all work together to provide you with the care you need, when you need it.  We recommend signing up for the patient portal called "MyChart".  Sign up information is provided on this After Visit Summary.  MyChart is used to connect with patients for Virtual Visits (Telemedicine).  Patients are able to view lab/test results, encounter notes, upcoming appointments, etc.  Non-urgent messages can be sent to your provider as well.   To learn more about what you can do with MyChart, go to [email or web address].    Your next appointment:   1 month(s)  Provider:   Gillian Shields, NP OR [facility]    Other Instructions MONITOR AND YOUR BLOOD PRESSURE TWICE A DAY. BRING READINGS AND MACHINE TO FOLLOW UP

## 2023-07-29 NOTE — Progress Notes (Signed)
Cardiology Office Note:  .    Date:  07/29/2023  ID:  Nathan Mcclure, DOB November 14, 1951, MRN [id number] PCP: Merri Brunette, MD  [facility] Providers Cardiologist:  Chilton Si, MD Electrophysiologist:  Lanier Prude, MD  Sleep Medicine:  Nicki Guadalajara, MD     History of Present Illness: .    Nathan Mcclure is a 72 y.o. male with a hx of persistent atrial fibrillation, non-obstructive CAD, OSA, hypertension, hyperlipidemia, and RA, here for follow-up. He had a cath in 2005 that showed mild non-obstructive disease. He developed atrial fibrillation in 2015 and was started on metoprolol and Xarelto. Echo at that time revealed LVEF 50-55% with mild to moderate mitral regurgitation. He was started on propafenone. He was also found to have sleep apnea and started on CPAP, but then transitioned to BiPAP. He had a cardioversion for Afib in 2018. He was transitioned from propafenone to amiodarone. He has worked with Dr. Tresa Endo for sleep medicine and the Afib clinic. He had another cardioversion 03/2022. He was seen in clinic later that month and was bradycardic to 48 bpm. He saw Dr. Lalla Brothers 05/2022 and planned for ablation.  At his visit 07/2022, he was maintaining sinus rhythm on amiodarone. He complained of fatigue possibly related to amiodarone, bradycardia, and/or malfunctioning CPAP. He underwent ablation 10/07/2022 and continued to maintain sinus rhythm at his follow-up with Dr. Lalla Brothers 12/2022. Amiodarone was discontinued.   Today, he reports several recurring episodes of Afib in the past month. He is able to feel the onset of his episodes which have lasted about 15-20 minutes before spontaneously converting to normal sinus rhythm. He believes taking ibuprofen may be a possible trigger. He has tried tylenol instead, but it is ineffective for his arthritis. In the office his blood pressure is 143/73 initially, and 154/72 on manual recheck. He states that his systolic pressure has always been  in the 140s historically. At home he monitors his blood pressures around noon, about 4 hours after taking his antihypertensives. He reports average readings around 135/77. He has returned to working, walking about 15,000 steps a day working in [employer] ge[employer]. He also walks his dog every day. At night he experiences some LE muscle cramps. He is taking vitamins B12 and D in the mornings. Additionally he complains of some RLE swelling that he attributes to his arthritis. He denies any chest pain, shortness of breath, lightheadedness, headaches, syncope, orthopnea, or PND.  ROS:  Please see the history of present illness. All other systems are reviewed and negative.  (+) Palpitations (+) Arthralgias (+) RLE swelling  Studies Reviewed: .        Risk Assessment/Calculations:    CHA2DS2-VASc Score = 2   This indicates a 2.2% annual risk of stroke. The patient's score is based upon: CHF History: 0 HTN History: 1 Diabetes History: 0 Stroke History: 0 Vascular Disease History: 0 Age Score: 1 Gender Score: 0    HYPERTENSION CONTROL Vitals:   07/29/23 0945 07/29/23 1012  BP: (!) 143/73 (!) 154/72    The patient's blood pressure is elevated above target today.  In order to address the patient's elevated BP: A new medication was prescribed today.          Physical Exam:    VS:  BP (!) 154/72 (BP Location: Right Arm, Patient Position: Sitting, Cuff Size: Large)   Pulse 64   Ht 6' (1.829 m)   Wt 229 lb 12.8 oz (104.2 kg)   SpO2 96%  BMI 31.17 kg/m  , BMI Body mass index is 31.17 kg/m. GENERAL:  Well appearing HEENT: Pupils equal round and reactive, fundi not visualized, oral mucosa unremarkable NECK:  No jugular venous distention, waveform within normal limits, carotid upstroke brisk and symmetric, no bruits, no thyromegaly LUNGS:  Clear to auscultation bilaterally HEART:  RRR.  PMI not displaced or sustained,S1 and S2 within normal limits, no S3, no S4, no clicks, no rubs, no  murmurs ABD:  Flat, positive bowel sounds normal in frequency in pitch, no bruits, no rebound, no guarding, no midline pulsatile mass, no hepatomegaly, no splenomegaly EXT:  2 plus pulses throughout, no edema, no cyanosis no clubbing SKIN:  No rashes no nodules NEURO:  Cranial nerves II through XII grossly intact, motor grossly intact throughout PSYCH:  Cognitively intact, oriented to person place and time  Wt Readings from Last 3 Encounters:  07/29/23 229 lb 12.8 oz (104.2 kg)  02/13/23 234 lb 12.8 oz (106.5 kg)  01/14/23 240 lb (108.9 kg)     ASSESSMENT AND PLAN: .    # Paroxysmal Atrial Fibrillation Recurrent episodes lasting 15-20 minutes, possibly triggered by ibuprofen use. Episodes are infrequent and self-resolving. -Continue current management and monitor for increased frequency or duration of episodes.  # Arthritis Pain managed with Celebrex 200mg , but patient desires increased dose. Ibuprofen use possibly linked to AFib episodes. -Declined patient's request to increase Celebrex due to risk of GI bleeding.  Any dose of NSAIDS increases risk of GIB on Xarelto. -Encouraged limiting NSAIDS and using Tylenol or alternatives if possible.  # Hypertension Blood pressure slightly elevated in office, patient reports home readings around 135/77-78. -Change Losartan to combined Valsartan/Hydrochlorothiazide to potentially improve control. -Check blood pressure at home and follow up in a month to assess response to medication change.   General Health Maintenance -Continue current exercise regimen and monitor for any symptoms of breathlessness or chest pain. -Continue current vitamin supplementation (B12 and Vitamin D). -Follow up in a month to assess blood pressure control and overall health status.         Dispo:  FU with APP/PharmD in 1 month.   I,Mathew Stumpf,acting as a Neurosurgeon for Chilton Si, MD.,have documented all relevant documentation on the behalf of Chilton Si, MD,as directed by  Chilton Si, MD while in the presence of Chilton Si, MD.  I,  C. Duke Salvia, MD have reviewed all documentation for this visit.  The documentation of the exam, diagnosis, procedures, and orders on 07/29/2023 are all accurate and complete.   Signed, Chilton Si, MD

## 2023-08-14 ENCOUNTER — Encounter (HOSPITAL_BASED_OUTPATIENT_CLINIC_OR_DEPARTMENT_OTHER): Payer: Self-pay | Admitting: Cardiovascular Disease

## 2023-08-14 MED ORDER — VALSARTAN 320 MG PO TABS: 320.0000 mg | ORAL_TABLET | Freq: Every day | ORAL | 6 refills | Status: DC

## 2023-08-14 NOTE — Telephone Encounter (Signed)
Please advise 

## 2023-08-14 NOTE — Telephone Encounter (Signed)
Lets back off a little and just do valsartan 320 mg.  Stop the hydrochlorothiazide component completely for now.

## 2023-08-25 ENCOUNTER — Ambulatory Visit (HOSPITAL_BASED_OUTPATIENT_CLINIC_OR_DEPARTMENT_OTHER): Payer: Medicare HMO | Admitting: Family

## 2023-08-25 DIAGNOSIS — M25552 Pain in left hip: Secondary | ICD-10-CM | POA: Diagnosis not present

## 2023-08-25 DIAGNOSIS — Z9181 History of falling: Secondary | ICD-10-CM | POA: Diagnosis not present

## 2023-09-03 DIAGNOSIS — M898X5 Other specified disorders of bone, thigh: Secondary | ICD-10-CM | POA: Diagnosis not present

## 2023-09-18 ENCOUNTER — Ambulatory Visit (HOSPITAL_BASED_OUTPATIENT_CLINIC_OR_DEPARTMENT_OTHER): Payer: Medicare HMO | Admitting: Family

## 2023-09-20 ENCOUNTER — Other Ambulatory Visit: Payer: Self-pay | Admitting: Cardiovascular Disease

## 2023-09-30 ENCOUNTER — Other Ambulatory Visit (HOSPITAL_BASED_OUTPATIENT_CLINIC_OR_DEPARTMENT_OTHER): Payer: Self-pay | Admitting: Cardiovascular Disease

## 2023-10-12 ENCOUNTER — Telehealth: Payer: Self-pay | Admitting: Cardiovascular Disease

## 2023-10-12 NOTE — Telephone Encounter (Signed)
Nathan Mcclure with [facility] called in to clarify if pt should be on Valsartan or Valsartan-HCT. Please advise.

## 2023-10-12 NOTE — Telephone Encounter (Signed)
Returned call to pharmacy, prescription clarified

## 2023-10-22 ENCOUNTER — Other Ambulatory Visit: Payer: Self-pay | Admitting: Cardiovascular Disease

## 2023-11-05 DIAGNOSIS — M545 Low back pain, unspecified: Secondary | ICD-10-CM | POA: Diagnosis not present

## 2023-11-05 DIAGNOSIS — G894 Chronic pain syndrome: Secondary | ICD-10-CM | POA: Diagnosis not present

## 2023-11-10 DIAGNOSIS — M25562 Pain in left knee: Secondary | ICD-10-CM | POA: Diagnosis not present

## 2023-11-12 DIAGNOSIS — D224 Melanocytic nevi of scalp and neck: Secondary | ICD-10-CM | POA: Diagnosis not present

## 2023-11-12 DIAGNOSIS — C44529 Squamous cell carcinoma of skin of other part of trunk: Secondary | ICD-10-CM | POA: Diagnosis not present

## 2023-11-12 DIAGNOSIS — L570 Actinic keratosis: Secondary | ICD-10-CM | POA: Diagnosis not present

## 2023-11-12 DIAGNOSIS — D485 Neoplasm of uncertain behavior of skin: Secondary | ICD-10-CM | POA: Diagnosis not present

## 2023-11-13 ENCOUNTER — Encounter: Payer: Self-pay | Admitting: Student

## 2023-11-13 ENCOUNTER — Ambulatory Visit (INDEPENDENT_AMBULATORY_CARE_PROVIDER_SITE_OTHER): Payer: Medicare HMO

## 2023-11-13 ENCOUNTER — Ambulatory Visit: Payer: Medicare HMO | Attending: Student | Admitting: Student

## 2023-11-13 VITALS — BP 164/94 | HR 45 | Ht 72.0 in | Wt 234.8 lb

## 2023-11-13 DIAGNOSIS — R002 Palpitations: Secondary | ICD-10-CM

## 2023-11-13 DIAGNOSIS — G4733 Obstructive sleep apnea (adult) (pediatric): Secondary | ICD-10-CM

## 2023-11-13 DIAGNOSIS — I4819 Other persistent atrial fibrillation: Secondary | ICD-10-CM

## 2023-11-13 MED ORDER — METOPROLOL TARTRATE 25 MG PO TABS: 12.5000 mg | ORAL_TABLET | ORAL | 3 refills | Status: AC | PRN

## 2023-11-13 NOTE — Progress Notes (Signed)
  Electrophysiology Office Note:   Date:  11/13/2023  ID:  CRHISTOPHER WALTNER, DOB [birth year], MRN [id number]  Primary Cardiologist: Chilton Si, MD Electrophysiologist: Lanier Prude, MD      History of Present Illness:   Nathan Mcclure is a 72 y.o. male with h/o AF s/p ablation 09/2022 and OSA on CPAP seen today for routine electrophysiology followup.   Since last being seen in our clinic the patient reports doing OK until about 2 weeks ago. Has had multiple episodes of HRs in 150-170s with minimal activity and the sensation similar to when he was previously in AF. Has had two significant episodes, but multiple smaller episodes as often as daily.  Feels fatigued and washed out during. No missed OAC.   Review of systems complete and found to be negative unless listed in HPI.   EP Information / Studies Reviewed:    EKG is ordered today. Personal review as below.  EKG Interpretation Date/Time:  Friday November 13 2023 10:48:41 EST Ventricular Rate:  44 PR Interval:  172 QRS Duration:  96 QT Interval:  462 QTC Calculation: 395 R Axis:   41  Text Interpretation: Marked sinus bradycardia T wave abnormality, consider inferior ischemia When compared with ECG of 13-Nov-2023 10:45, No significant change was found Confirmed by Maxine Glenn [phone]) on 11/13/2023 10:58:06 AM    Arrhythmia History  S/p ablation 09/2022  Physical Exam:   VS:  BP (!) 164/94 (BP Location: Left Arm, Patient Position: Sitting, Cuff Size: Large)   Pulse (!) 45   Ht 6' (1.829 m)   Wt 234 lb 12.8 oz (106.5 kg)   SpO2 98%   BMI 31.84 kg/m     Wt Readings from Last 3 Encounters:  11/13/23 234 lb 12.8 oz (106.5 kg)  07/29/23 229 lb 12.8 oz (104.2 kg)  02/13/23 234 lb 12.8 oz (106.5 kg)     GEN: Well nourished, well developed in no acute distress NECK: No JVD; No carotid bruits CARDIAC: Regular rate and rhythm, no murmurs, rubs, gallops RESPIRATORY:  Clear to auscultation without rales, wheezing  or rhonchi  ABDOMEN: Soft, non-tender, non-distended EXTREMITIES:  No edema; No deformity   ASSESSMENT AND PLAN:    Persistent AF EKG today shows sinus bradycardia Amiodarone stopped 12/2022. He does not wish to re-challenge, and with bradycardia am hesitant to even try.  Tikosyn less ideal with bradycardia, and pt prefers to avoid admission.  Continue Xarelto for CHA2DS2/VASc at least 4. Pt is willing to consider re-do ablation. Discussed with Dr. Lalla Brothers; No additional lines or ablation with first PVI, so certainly reasonable, especially with AAD options limited from bradycardia.  Will add 12.5 mg lopressor to use AS NEEDED only if HRs > 130 and remain there for >15-20 minutes.   OSA  Encouraged nightly CPAP   Will have pt wear monitor to further clarify arrhyhtmia. (? Flutter vs fib vs even an SVT) and see back in month. Tentatively planning for re-do ablation early next year pending symptoms and type of arrhyhtmia    Follow up with EP APP in 4 weeks to further discuss and plan  Signed, Graciella Freer, PA-C

## 2023-11-13 NOTE — Progress Notes (Unsigned)
Enrolled patient for a 14 day Zio XT monitor to be mailed to patients home  Nathan Mcclure to read

## 2023-11-13 NOTE — Patient Instructions (Addendum)
Medication Instructions:  Start metoprolol tartrate (Lopressor) 12.5 mg (1/2 of the 25 mg tablet) as needed for tachycardia (fast heart rate). *If you need a refill on your cardiac medications before your next appointment, please call your pharmacy*  Lab Work: None ordered If you have labs (blood work) drawn today and your tests are completely normal, you will receive your results only by: MyChart Message (if you have MyChart) OR A paper copy in the mail If you have any lab test that is abnormal or we need to change your treatment, we will call you to review the results.  Testing/Procedures: Nathan Mcclure- Long Term Monitor Instructions  Your physician has requested you wear a ZIO patch monitor for 14 days.  This is a single patch monitor. [employer] supplies one patch monitor per enrollment. Additional stickers are not available. Please do not apply patch if you will be having a Nuclear Stress Test,  Echocardiogram, Cardiac CT, MRI, or Chest Xray during the period you would be wearing the  monitor. The patch cannot be worn during these tests. You cannot remove and re-apply the  ZIO XT patch monitor.  Your ZIO patch monitor will be mailed 3 day USPS to your address on file. It may take 3-5 days  to receive your monitor after you have been enrolled.  Once you have received your monitor, please review the enclosed instructions. Your monitor  has already been registered assigning a specific monitor serial # to you.  Billing and Patient Assistance Program Information  We have supplied [employer] with any of your insurance information on file for billing purposes. [employer] offers a sliding scale Patient Assistance Program for patients that do not have  insurance, or whose insurance does not completely cover the cost of the ZIO monitor.  You must apply for the Patient Assistance Program to qualify for this discounted rate.  To apply, please call [employer] at [phone], select option 4, select option 2,  ask to apply for  Patient Assistance Program. Nathan Mcclure will ask your household income, and how many people  are in your household. They will quote your out-of-pocket cost based on that information.  [employer] will also be able to set up a 74-month, interest-free payment plan if needed.  Applying the monitor   Shave hair from upper left chest.  Hold abrader disc by orange tab. Rub abrader in 40 strokes over the upper left chest as  indicated in your monitor instructions.  Clean area with 4 enclosed alcohol pads. Let dry.  Apply patch as indicated in monitor instructions. Patch will be placed under collarbone on left  side of chest with arrow pointing upward.  Rub patch adhesive wings for 2 minutes. Remove white label marked "1". Remove the white  label marked "2". Rub patch adhesive wings for 2 additional minutes.  While looking in a mirror, press and release button in center of patch. A small green light will  flash 3-4 times. This will be your only indicator that the monitor has been turned on.  Do not shower for the first 24 hours. You may shower after the first 24 hours.  Press the button if you feel a symptom. You will hear a small click. Record Date, Time and  Symptom in the Patient Logbook.  When you are ready to remove the patch, follow instructions on the last 2 pages of Patient  Logbook. Stick patch monitor onto the last page of Patient Logbook.  Place Patient Logbook in the blue and white box. Use  locking tab on box and tape box closed  securely. The blue and white box has prepaid postage on it. Please place it in the mailbox as  soon as possible. Your physician should have your test results approximately 7 days after the  monitor has been mailed back to [facility].  Call [facility] Customer Care at [phone] if you have questions regarding  your ZIO XT patch monitor. Call them immediately if you see an orange light blinking on your  monitor.  If your monitor falls off  in less than 4 days, contact our Monitor department at [phone].  If your monitor becomes loose or falls off after 4 days call [employer] at [phone] for  suggestions on securing your monitor   Follow-Up: At [facility], you and your health needs are our priority.  As part of our continuing mission to provide you with exceptional heart care, we have created designated Provider Care Teams.  These Care Teams include your primary Cardiologist (physician) and Advanced Practice Providers (APPs -  Physician Assistants and Nurse Practitioners) who all work together to provide you with the care you need, when you need it.  Your next appointment:   As scheduled

## 2023-11-18 DIAGNOSIS — R002 Palpitations: Secondary | ICD-10-CM | POA: Diagnosis not present

## 2023-12-01 DIAGNOSIS — I2510 Atherosclerotic heart disease of native coronary artery without angina pectoris: Secondary | ICD-10-CM | POA: Diagnosis not present

## 2023-12-01 DIAGNOSIS — R7303 Prediabetes: Secondary | ICD-10-CM | POA: Diagnosis not present

## 2023-12-01 DIAGNOSIS — F324 Major depressive disorder, single episode, in partial remission: Secondary | ICD-10-CM | POA: Diagnosis not present

## 2023-12-01 DIAGNOSIS — I10 Essential (primary) hypertension: Secondary | ICD-10-CM | POA: Diagnosis not present

## 2023-12-01 DIAGNOSIS — I4891 Unspecified atrial fibrillation: Secondary | ICD-10-CM | POA: Diagnosis not present

## 2023-12-01 DIAGNOSIS — Z7901 Long term (current) use of anticoagulants: Secondary | ICD-10-CM | POA: Diagnosis not present

## 2023-12-01 DIAGNOSIS — E7800 Pure hypercholesterolemia, unspecified: Secondary | ICD-10-CM | POA: Diagnosis not present

## 2023-12-01 DIAGNOSIS — Z23 Encounter for immunization: Secondary | ICD-10-CM | POA: Diagnosis not present

## 2023-12-02 LAB — LAB REPORT - SCANNED
A1c: 6
EGFR: 57

## 2023-12-09 NOTE — Progress Notes (Signed)
  Electrophysiology Office Note:   Date:  12/11/2023  ID:  Nathan Mcclure, DOB 1951/03/05, MRN [id number]  Primary Cardiologist: Chilton Si, MD Electrophysiologist: Lanier Prude, MD      History of Present Illness:   Nathan Mcclure is a 72 y.o. male with h/o AF ablation 09/2022 and OSA on CPAP seen today for routine electrophysiology followup.   Since last being seen in our clinic the patient reports doing well. He had symptoms requiring lopressor twice while wearing his monitor, none since it's removal.  Monitor showed brief AF RV vs SVT with abberancy vs NSVT. Longest episode 15 minutes. No syncope. Otherwise doing OK. Does still have an overall fatigue despite no further palpitations.   Review of systems complete and found to be negative unless listed in HPI.   EP Information / Studies Reviewed:    EKG is not ordered today. EKG from 11/13/2023 reviewed which showed sinu sinus bradycardia at 44 bpm       Arrhythmia History  S/p ablation 09/2022  Monitor 10/2023 min HR of 37 bpm, max HR of 203 bpm AVG HR of 67 bpm.  7 Ventricular Tachycardia runs occurred, the run with the fastest interval lasting 23.8 secs with a max rate of 193 bpm (avg 175 bpm); the run with the fastest interval was also the longest.  Episodes of Ventricular Tachycardia may be Supraventricular Tachycardia with possible aberrancy.  62 Supraventricular Tachycardia runs  Fastest rate 203 bpm; Longest 18 min 46s.  Some episodes of Supraventricular Tachycardia may be possible Atrial Tachycardia with variable block.  Ventricular Tachycardia and Supraventricular Tachycardia were detected within +/- 45 seconds of symptomatic patient event(s).  PACs <1.0% PVCs <1.0%  Physical Exam:   VS:  BP 134/74   Pulse 60   Ht 6' (1.829 m)   Wt 235 lb 9.6 oz (106.9 kg)   SpO2 98%   BMI 31.95 kg/m    Wt Readings from Last 3 Encounters:  12/11/23 235 lb 9.6 oz (106.9 kg)  11/13/23 234 lb 12.8 oz (106.5 kg)   07/29/23 229 lb 12.8 oz (104.2 kg)     GEN: Well nourished, well developed in no acute distress NECK: No JVD; No carotid bruits CARDIAC: Regular rate and rhythm, no murmurs, rubs, gallops RESPIRATORY:  Clear to auscultation without rales, wheezing or rhonchi  ABDOMEN: Soft, non-tender, non-distended EXTREMITIES:  No edema; No deformity   ASSESSMENT AND PLAN:    Persistent AF ? SVT with aberrancy Possible NSVT Regular in 60s on exam today. On exam today.  Amiodarone stopped 12/2022. He does not wish to re-challenge, and with bradycardia am hesitant to even try.   Tikosyn less ideal with bradycardia, but may be a possibility.  Continue Xarelto for CHA2DS2/VASc at least 4. Pt is willing to consider re-do ablation for AF +/- SVT. Will review further with Dr. Lalla Brothers.  He has had no further episodes since removing the monitor, and would prefer continued watchful waiting at this time.  Continue 12.5 mg lopressor to use AS NEEDED only if HRs > 130 and remain there    ? If underlying fatigue is a tachy-brady picture. Will update Echo for further guidance. He understands it may someday come to a Pacemaker.   OSA  Encouraged nightly CPAP    Follow up with Dr. Lalla Brothers in 2-3 months.   Signed, Graciella Freer, PA-C

## 2023-12-10 ENCOUNTER — Telehealth: Payer: Self-pay | Admitting: Cardiovascular Disease

## 2023-12-10 DIAGNOSIS — R002 Palpitations: Secondary | ICD-10-CM | POA: Diagnosis not present

## 2023-12-10 NOTE — Telephone Encounter (Signed)
Zio is calling with abnormal test results. Please advise

## 2023-12-10 NOTE — Telephone Encounter (Signed)
Received call from [facility] regarding patients monitor results   7 Ventricular Tachycardia runs occurred, the run with the fastest interval lasting 23.8 secs with a max rate of 193 bpm (avg 175 bpm); the run with the fastest interval was also the longest. Episodes of Ventricular Tachycardia may be Supraventricular Tachycardia with possible aberrancy. 62 Supraventricular Tachycardia runs occurred, the run with the fastest interval lasting 15  mins 47 secs with a max rate of 203 bpm, the longest lasting 18 mins 46 secs with an avg rate of 148 bpm  Monitor ordered by Inocente Salles PA with EP  Will forward to [facility] triage so can be addressed

## 2023-12-11 ENCOUNTER — Ambulatory Visit: Payer: Medicare HMO | Attending: Student | Admitting: Student

## 2023-12-11 ENCOUNTER — Encounter: Payer: Self-pay | Admitting: Student

## 2023-12-11 VITALS — BP 134/74 | HR 60 | Ht 72.0 in | Wt 235.6 lb

## 2023-12-11 DIAGNOSIS — I10 Essential (primary) hypertension: Secondary | ICD-10-CM | POA: Diagnosis not present

## 2023-12-11 DIAGNOSIS — G4739 Other sleep apnea: Secondary | ICD-10-CM

## 2023-12-11 DIAGNOSIS — G4733 Obstructive sleep apnea (adult) (pediatric): Secondary | ICD-10-CM | POA: Diagnosis not present

## 2023-12-11 DIAGNOSIS — I480 Paroxysmal atrial fibrillation: Secondary | ICD-10-CM

## 2023-12-11 DIAGNOSIS — I4819 Other persistent atrial fibrillation: Secondary | ICD-10-CM | POA: Diagnosis not present

## 2023-12-11 NOTE — Patient Instructions (Signed)
Medication Instructions:  Your physician recommends that you continue on your current medications as directed. Please refer to the Current Medication list given to you today.  *If you need a refill on your cardiac medications before your next appointment, please call your pharmacy*  Lab Work: None ordered If you have labs (blood work) drawn today and your tests are completely normal, you will receive your results only by: MyChart Message (if you have MyChart) OR A paper copy in the mail If you have any lab test that is abnormal or we need to change your treatment, we will call you to review the results.  Testing/Procedures: Your physician has requested that you have an echocardiogram. Echocardiography is a painless test that uses sound waves to create images of your heart. It provides your doctor with information about the size and shape of your heart and how well your heart's chambers and valves are working. This procedure takes approximately one hour. There are no restrictions for this procedure. Please do NOT wear cologne, perfume, aftershave, or lotions (deodorant is allowed). Please arrive 15 minutes prior to your appointment time.  Please note: We ask at that you not bring children with you during ultrasound (echo/ vascular) testing. Due to room size and safety concerns, children are not allowed in the ultrasound rooms during exams. Our front office staff cannot provide observation of children in our lobby area while testing is being conducted. An adult accompanying a patient to their appointment will only be allowed in the ultrasound room at the discretion of the ultrasound technician under special circumstances. We apologize for any inconvenience.   Follow-Up: At [facility], you and your health needs are our priority.  As part of our continuing mission to provide you with exceptional heart care, we have created designated Provider Care Teams.  These Care Teams include your  primary Cardiologist (physician) and Advanced Practice Providers (APPs -  Physician Assistants and Nurse Practitioners) who all work together to provide you with the care you need, when you need it.  Your next appointment:   2-3 month(s)  Provider:   Steffanie Dunn, MD Only

## 2023-12-24 ENCOUNTER — Encounter: Payer: Self-pay | Admitting: Cardiovascular Disease

## 2024-01-07 ENCOUNTER — Other Ambulatory Visit: Payer: Self-pay | Admitting: Orthopaedic Surgery

## 2024-01-07 DIAGNOSIS — M545 Low back pain, unspecified: Secondary | ICD-10-CM

## 2024-01-15 ENCOUNTER — Ambulatory Visit (HOSPITAL_COMMUNITY): Payer: BC Managed Care – PPO | Attending: Student

## 2024-01-15 DIAGNOSIS — I480 Paroxysmal atrial fibrillation: Secondary | ICD-10-CM | POA: Diagnosis not present

## 2024-01-15 LAB — ECHOCARDIOGRAM COMPLETE
Area-P 1/2: 4.15 cm2
S' Lateral: 3.1 cm

## 2024-01-19 ENCOUNTER — Encounter: Payer: Self-pay | Admitting: Orthopaedic Surgery

## 2024-02-01 ENCOUNTER — Ambulatory Visit
Admission: RE | Admit: 2024-02-01 | Discharge: 2024-02-01 | Disposition: A | Discharge status: Home / Self Care | Payer: BC Managed Care – PPO | Source: Ambulatory Visit | Attending: Orthopaedic Surgery | Admitting: Orthopaedic Surgery

## 2024-02-01 DIAGNOSIS — G8929 Other chronic pain: Secondary | ICD-10-CM | POA: Diagnosis not present

## 2024-02-01 DIAGNOSIS — M545 Low back pain, unspecified: Secondary | ICD-10-CM | POA: Diagnosis not present

## 2024-02-01 MED ORDER — GADOPICLENOL 0.5 MMOL/ML IV SOLN
10.0000 mL | Freq: Once | INTRAVENOUS | Status: AC | PRN
Start: 2024-02-01 — End: 2024-02-01
  Administered 2024-02-01: 10 mL via INTRAVENOUS

## 2024-02-08 DIAGNOSIS — M1712 Unilateral primary osteoarthritis, left knee: Secondary | ICD-10-CM | POA: Diagnosis not present

## 2024-02-10 DIAGNOSIS — G894 Chronic pain syndrome: Secondary | ICD-10-CM | POA: Diagnosis not present

## 2024-02-11 NOTE — Progress Notes (Deleted)
 Electrophysiology Office Follow up Visit Note:    Date:  02/11/2024   ID:  Nathan Mcclure, DOB 1951/10/26, MRN [id number]  PCP:  Merri Brunette, MD  [facility] Cardiologist:  Chilton Si, MD  [facility] Electrophysiologist:  Lanier Prude, MD    Interval History:     Nathan Mcclure is a 73 y.o. male who presents for a follow up visit.   The patient last saw Mardelle Matte December 11, 2023 for persistent atrial fibrillation.  He was previously on amiodarone but this stopped January 2024.  He is on Xarelto for stroke prophylaxis. He had a prior catheter ablation for atrial fibrillation on October 07, 2022.  During that procedure the pulmonary veins were ablated.  The posterior wall was left intact. I last saw the patient January 14, 2023.  At that time he was doing well and I stopped amiodarone.        Past medical, surgical, social and family history were reviewed.  ROS:   Please see the history of present illness.    All other systems reviewed and are negative.  EKGs/Labs/Other Studies Reviewed:    The following studies were reviewed today:  December 13, 2023 ZIO monitor personally reviewed 79 SVT episodes, longest 15 minutes and 47 seconds with an average rate of 148 bpm No atrial fibrillation 7 nonsustained VT episodes, longest 23 seconds with an average rate of 175 bpm         Physical Exam:    VS:  There were no vitals taken for this visit.    Wt Readings from Last 3 Encounters:  12/11/23 235 lb 9.6 oz (106.9 kg)  11/13/23 234 lb 12.8 oz (106.5 kg)  07/29/23 229 lb 12.8 oz (104.2 kg)     GEN: no distress CARD: RRR, No MRG RESP: No IWOB. CTAB.      ASSESSMENT:    1. Persistent atrial fibrillation (HCC)   2. Primary hypertension   3. SVT (supraventricular tachycardia) (HCC)    PLAN:    In order of problems listed above:  #Persistent atrial fibrillation #SVT The patient has a prior A-fib ablation in October 2023.  During that  procedure the pulmonary veins were isolated.  Unfortunately he has had episodes of tachycardia.  Monitor shows a narrow complex tachycardia with a rate around 150 bpm.  The appearance of the tachycardia appears consistent with SVT, likely AVNRT although I cannot completely exclude other mechanisms of SVT including atrial flutter.  I have discussed treatment options for the patient including continuing conservative management, antiarrhythmic drugs and catheter ablation.  He is hesitant to reload with amiodarone which I think is reasonable.  Redo catheter ablation strategy would be EP study with SVT ablation, redo PVI, adding posterior wall.  Discussed treatment options today for SVT and AF including antiarrhythmic drug therapy and ablation. Discussed risks, recovery and likelihood of success with each treatment strategy. Risk, benefits, and alternatives to EP study and ablation for afib were discussed. These risks include but are not limited to stroke, bleeding, vascular damage, tamponade, perforation, damage to the esophagus, lungs, phrenic nerve and other structures, pulmonary vein stenosis, worsening renal function, coronary vasospasm and death.  Discussed potential need for repeat ablation procedures and antiarrhythmic drugs after an initial ablation. The patient understands these risk and wishes to proceed.  We will therefore proceed with catheter ablation at the next available time.  Carto, ICE, anesthesia are requested for the procedure.  Will also obtain CT PV protocol prior to the  procedure to exclude LAA thrombus and further evaluate atrial anatomy.   He will hold his metoprolol for 5 days prior to the procedure.  #Hypertension *** goal today.  Recommend checking blood pressures 1-2 times per week at home and recording the values.  Recommend bringing these recordings to the primary care physician.     Signed, Nathan Dunn, MD, [facility], [facility] 02/11/2024 1:32 PM    Electrophysiology [facility]

## 2024-02-12 ENCOUNTER — Ambulatory Visit: Payer: BC Managed Care – PPO | Admitting: Cardiology

## 2024-02-12 DIAGNOSIS — I471 Supraventricular tachycardia, unspecified: Secondary | ICD-10-CM

## 2024-02-12 DIAGNOSIS — I4819 Other persistent atrial fibrillation: Secondary | ICD-10-CM

## 2024-02-12 DIAGNOSIS — I10 Essential (primary) hypertension: Secondary | ICD-10-CM

## 2024-03-01 DIAGNOSIS — M47816 Spondylosis without myelopathy or radiculopathy, lumbar region: Secondary | ICD-10-CM | POA: Diagnosis not present

## 2024-03-09 DIAGNOSIS — M79642 Pain in left hand: Secondary | ICD-10-CM | POA: Diagnosis not present

## 2024-03-09 DIAGNOSIS — R5383 Other fatigue: Secondary | ICD-10-CM | POA: Diagnosis not present

## 2024-03-09 DIAGNOSIS — Z6830 Body mass index (BMI) 30.0-30.9, adult: Secondary | ICD-10-CM | POA: Diagnosis not present

## 2024-03-09 DIAGNOSIS — Z79899 Other long term (current) drug therapy: Secondary | ICD-10-CM | POA: Diagnosis not present

## 2024-03-09 DIAGNOSIS — M059 Rheumatoid arthritis with rheumatoid factor, unspecified: Secondary | ICD-10-CM | POA: Diagnosis not present

## 2024-03-09 DIAGNOSIS — M1991 Primary osteoarthritis, unspecified site: Secondary | ICD-10-CM | POA: Diagnosis not present

## 2024-03-09 DIAGNOSIS — M79641 Pain in right hand: Secondary | ICD-10-CM | POA: Diagnosis not present

## 2024-03-15 DIAGNOSIS — M1712 Unilateral primary osteoarthritis, left knee: Secondary | ICD-10-CM | POA: Diagnosis not present

## 2024-03-16 DIAGNOSIS — G894 Chronic pain syndrome: Secondary | ICD-10-CM | POA: Diagnosis not present

## 2024-03-16 DIAGNOSIS — Z79891 Long term (current) use of opiate analgesic: Secondary | ICD-10-CM | POA: Diagnosis not present

## 2024-03-16 DIAGNOSIS — Z79899 Other long term (current) drug therapy: Secondary | ICD-10-CM | POA: Diagnosis not present

## 2024-03-16 DIAGNOSIS — M47816 Spondylosis without myelopathy or radiculopathy, lumbar region: Secondary | ICD-10-CM | POA: Diagnosis not present

## 2024-03-22 DIAGNOSIS — M47816 Spondylosis without myelopathy or radiculopathy, lumbar region: Secondary | ICD-10-CM | POA: Diagnosis not present

## 2024-03-30 NOTE — Progress Notes (Unsigned)
 Electrophysiology Office Follow up Visit Note:    Date:  03/30/2024   ID:  Nathan Mcclure, DOB Feb 22, 1951, MRN [id number]  PCP:  Merri Brunette, MD  [facility] Cardiologist:  Chilton Si, MD  [facility] Electrophysiologist:  Lanier Prude, MD    Interval History:     Nathan Mcclure is a 73 y.o. male who presents for a follow up visit.   I last saw the patient January 14, 2023.  The patient had a catheter ablation for atrial fibrillation on October 07, 2022 during which the pulmonary veins were isolated.  He was previously on amiodarone and Xarelto for stroke prophylaxis.  We stopped the amiodarone in January 2024.  He saw Dr. Duke Salvia in clinic July 29, 2023 for follow-up.  He reported episodes of atrial fibrillation lasting 15 to 20 minutes.  He saw Mardelle Matte in clinic November 13, 2023 and then again on December 11, 2023.  At the most recent appoint with Mardelle Matte the patient expressed an interest in pursuing redo catheter ablation to better control his arrhythmias.       Past medical, surgical, social and family history were reviewed.  ROS:   Please see the history of present illness.    All other systems reviewed and are negative.  EKGs/Labs/Other Studies Reviewed:    The following studies were reviewed today:  January 15, 2024 echo EF 60-65 RV normal Mild MR  December 13, 2023 ZIO monitor 62 episodes of SVT, longest 15 minutes and 47 seconds with an average rate of 148 bpm No atrial fibrillation  November 13, 2023 EKG shows sinus bradycardia, QTc 400 ms  October 08, 2022 EKG shows sinus rhythm, QTc 460 ms  March 06, 2022 EKG shows atrial fibrillation with a ventricular rate of 83 bpm       Physical Exam:    VS:  There were no vitals taken for this visit.    Wt Readings from Last 3 Encounters:  12/11/23 235 lb 9.6 oz (106.9 kg)  11/13/23 234 lb 12.8 oz (106.5 kg)  07/29/23 229 lb 12.8 oz (104.2 kg)     GEN: no distress CARD: RRR, No  MRG RESP: No IWOB. CTAB.      ASSESSMENT:    No diagnosis found. PLAN:    In order of problems listed above:  #Atrial fibrillation symptomatic and recurrent after ablation October 07, 2022. On xarelto for stroke ppx.  I discussed treatment options for her atrial fibrillation during today's clinic appointment.  We discussed conservative management, antiarrhythmic drugs and catheter ablation.  He would like to avoid additional medications if at all possible which I think is reasonable.  I discussed a redo catheter ablation procedure in detail including the risks, recovery and likelihood of success.  I discussed the possibility of still needing an antiarrhythmic drug after redo catheter ablation.  He is understanding and wishes to proceed with evaluation for catheter ablation.  Discussed treatment options today for AF including antiarrhythmic drug therapy and ablation. Discussed risks, recovery and likelihood of success with each treatment strategy. Risk, benefits, and alternatives to EP study and ablation for afib were discussed. These risks include but are not limited to stroke, bleeding, vascular damage, tamponade, perforation, damage to the esophagus, lungs, phrenic nerve and other structures, pulmonary vein stenosis, worsening renal function, coronary vasospasm and death.  Discussed potential need for repeat ablation procedures and antiarrhythmic drugs after an initial ablation. The patient understands these risk and wishes to proceed.  We will therefore proceed  with catheter ablation at the next available time.  Carto, ICE, anesthesia are requested for the procedure.  Will also obtain CT PV protocol prior to the procedure to exclude LAA thrombus and further evaluate atrial anatomy.  Ablation strategy would be PVI plus posterior wall plus EP study.    Signed, Steffanie Dunn, MD, [facility], [facility] 03/30/2024 10:28 AM    Electrophysiology [facility]

## 2024-03-31 ENCOUNTER — Encounter: Payer: Self-pay | Admitting: Cardiology

## 2024-03-31 ENCOUNTER — Ambulatory Visit: Payer: BC Managed Care – PPO | Attending: Cardiology | Admitting: Cardiology

## 2024-03-31 VITALS — BP 144/80 | HR 56 | Ht 76.0 in | Wt 228.0 lb

## 2024-03-31 DIAGNOSIS — I10 Essential (primary) hypertension: Secondary | ICD-10-CM | POA: Diagnosis not present

## 2024-03-31 DIAGNOSIS — I480 Paroxysmal atrial fibrillation: Secondary | ICD-10-CM

## 2024-03-31 NOTE — Patient Instructions (Signed)
 Medication Instructions:  Your physician recommends that you continue on your current medications as directed. Please refer to the Current Medication list given to you today.  *If you need a refill on your cardiac medications before your next appointment, please call your pharmacy*  Testing/Procedures: Watchman  Your physician has requested that you have Left atrial appendage (LAA) closure device implantation is a procedure to put a small device in the LAA of the heart. The LAA is a small sac in the wall of the heart's left upper chamber. Blood clots can form in this area. The device, Watchman closes the LAA to help prevent a blood clot and stroke.   Follow-Up: At [facility], you and your health needs are our priority.  As part of our continuing mission to provide you with exceptional heart care, our providers are all part of one team.  This team includes your primary Cardiologist (physician) and Advanced Practice Providers or APPs (Physician Assistants and Nurse Practitioners) who all work together to provide you with the care you need, when you need it.  Your next appointment:   You will be contacted by Nurse Navigator, Karsten Fells to schedule your pre-procedure visit and procedure date. If you have any questions she can be reached at [phone].        1st Floor: - Lobby - Registration  - Pharmacy  - Lab - Cafe  2nd Floor: - PV Lab - Diagnostic Testing (echo, CT, nuclear med)  3rd Floor: - Vacant  4th Floor: - TCTS (cardiothoracic surgery) - AFib Clinic - Structural Heart Clinic - Vascular Surgery  - Vascular Ultrasound  5th Floor: - HeartCare Cardiology (general and EP) - Clinical Pharmacy for coumadin, hypertension, lipid, weight-loss medications, and med management appointments    Valet parking services will be available as well.

## 2024-04-05 DIAGNOSIS — M059 Rheumatoid arthritis with rheumatoid factor, unspecified: Secondary | ICD-10-CM | POA: Diagnosis not present

## 2024-04-05 DIAGNOSIS — Z79899 Other long term (current) drug therapy: Secondary | ICD-10-CM | POA: Diagnosis not present

## 2024-04-05 DIAGNOSIS — E669 Obesity, unspecified: Secondary | ICD-10-CM | POA: Diagnosis not present

## 2024-04-05 DIAGNOSIS — M1991 Primary osteoarthritis, unspecified site: Secondary | ICD-10-CM | POA: Diagnosis not present

## 2024-04-05 DIAGNOSIS — Z6831 Body mass index (BMI) 31.0-31.9, adult: Secondary | ICD-10-CM | POA: Diagnosis not present

## 2024-04-09 ENCOUNTER — Other Ambulatory Visit: Payer: Self-pay | Admitting: Cardiology

## 2024-04-11 ENCOUNTER — Telehealth: Payer: Self-pay

## 2024-04-11 NOTE — Telephone Encounter (Signed)
 Prescription refill request for Xarelto  received.  Indication:afib Last office visit:4/25 Weight:103.4  kg Age:73 Scr:1.33  12/24 CrCl:73.43  ml/min  Prescription refilled

## 2024-04-11 NOTE — Telephone Encounter (Signed)
 Alen Husbands: Small chicken wing. Max 18/ AVG 16/ Depth 11.2 Likely use a 20mm device and double curve sheath Inf/Mid-Ant TSP RAO 35 CAU 35

## 2024-04-13 ENCOUNTER — Other Ambulatory Visit (HOSPITAL_BASED_OUTPATIENT_CLINIC_OR_DEPARTMENT_OTHER): Payer: Self-pay | Admitting: Cardiovascular Disease

## 2024-04-14 ENCOUNTER — Encounter (HOSPITAL_BASED_OUTPATIENT_CLINIC_OR_DEPARTMENT_OTHER): Payer: Self-pay | Admitting: Cardiovascular Disease

## 2024-04-14 MED ORDER — ROSUVASTATIN CALCIUM 20 MG PO TABS: 20.0000 mg | ORAL_TABLET | Freq: Every evening | ORAL | 3 refills | Status: AC

## 2024-04-20 DIAGNOSIS — L570 Actinic keratosis: Secondary | ICD-10-CM | POA: Diagnosis not present

## 2024-04-20 DIAGNOSIS — C44529 Squamous cell carcinoma of skin of other part of trunk: Secondary | ICD-10-CM | POA: Diagnosis not present

## 2024-04-20 DIAGNOSIS — L219 Seborrheic dermatitis, unspecified: Secondary | ICD-10-CM | POA: Diagnosis not present

## 2024-04-20 DIAGNOSIS — L918 Other hypertrophic disorders of the skin: Secondary | ICD-10-CM | POA: Diagnosis not present

## 2024-04-20 DIAGNOSIS — L281 Prurigo nodularis: Secondary | ICD-10-CM | POA: Diagnosis not present

## 2024-04-20 DIAGNOSIS — D485 Neoplasm of uncertain behavior of skin: Secondary | ICD-10-CM | POA: Diagnosis not present

## 2024-04-20 DIAGNOSIS — R202 Paresthesia of skin: Secondary | ICD-10-CM | POA: Diagnosis not present

## 2024-04-22 DIAGNOSIS — F324 Major depressive disorder, single episode, in partial remission: Secondary | ICD-10-CM | POA: Insufficient documentation

## 2024-04-22 DIAGNOSIS — E7800 Pure hypercholesterolemia, unspecified: Secondary | ICD-10-CM | POA: Insufficient documentation

## 2024-04-22 DIAGNOSIS — R7303 Prediabetes: Secondary | ICD-10-CM | POA: Insufficient documentation

## 2024-04-28 ENCOUNTER — Telehealth: Payer: Self-pay

## 2024-04-28 NOTE — Telephone Encounter (Signed)
 Called to arrange LAAO. The patient stated, "Sorry, but I'm going to pass." Instructed him to call if he changes his mind.

## 2024-05-04 ENCOUNTER — Other Ambulatory Visit (HOSPITAL_BASED_OUTPATIENT_CLINIC_OR_DEPARTMENT_OTHER): Payer: Self-pay | Admitting: Cardiovascular Disease

## 2024-05-04 MED ORDER — VALSARTAN 320 MG PO TABS: 160.0000 mg | ORAL_TABLET | Freq: Every day | ORAL | 1 refills | Status: DC

## 2024-05-10 ENCOUNTER — Encounter (HOSPITAL_BASED_OUTPATIENT_CLINIC_OR_DEPARTMENT_OTHER): Payer: Self-pay | Admitting: Cardiovascular Disease

## 2024-05-10 ENCOUNTER — Encounter: Payer: Self-pay | Admitting: Cardiology

## 2024-05-11 ENCOUNTER — Encounter: Payer: Self-pay | Admitting: Pharmacist

## 2024-05-11 NOTE — Telephone Encounter (Signed)
**Note De-identified  [place] Obfuscation** Please advise 

## 2024-05-24 DIAGNOSIS — H43393 Other vitreous opacities, bilateral: Secondary | ICD-10-CM | POA: Diagnosis not present

## 2024-05-24 DIAGNOSIS — H43811 Vitreous degeneration, right eye: Secondary | ICD-10-CM | POA: Diagnosis not present

## 2024-05-24 DIAGNOSIS — H2513 Age-related nuclear cataract, bilateral: Secondary | ICD-10-CM | POA: Diagnosis not present

## 2024-05-24 DIAGNOSIS — H52223 Regular astigmatism, bilateral: Secondary | ICD-10-CM | POA: Diagnosis not present

## 2024-05-24 DIAGNOSIS — H5203 Hypermetropia, bilateral: Secondary | ICD-10-CM | POA: Diagnosis not present

## 2024-05-24 DIAGNOSIS — H524 Presbyopia: Secondary | ICD-10-CM | POA: Diagnosis not present

## 2024-05-26 ENCOUNTER — Encounter (HOSPITAL_BASED_OUTPATIENT_CLINIC_OR_DEPARTMENT_OTHER): Payer: Self-pay | Admitting: Cardiovascular Disease

## 2024-05-26 ENCOUNTER — Ambulatory Visit (INDEPENDENT_AMBULATORY_CARE_PROVIDER_SITE_OTHER): Admitting: Cardiovascular Disease

## 2024-05-26 VITALS — BP 132/70 | HR 67 | Ht 72.0 in | Wt 221.4 lb

## 2024-05-26 DIAGNOSIS — I2510 Atherosclerotic heart disease of native coronary artery without angina pectoris: Secondary | ICD-10-CM

## 2024-05-26 DIAGNOSIS — R7303 Prediabetes: Secondary | ICD-10-CM | POA: Diagnosis not present

## 2024-05-26 DIAGNOSIS — E785 Hyperlipidemia, unspecified: Secondary | ICD-10-CM | POA: Diagnosis not present

## 2024-05-26 DIAGNOSIS — I4819 Other persistent atrial fibrillation: Secondary | ICD-10-CM

## 2024-05-26 DIAGNOSIS — G4733 Obstructive sleep apnea (adult) (pediatric): Secondary | ICD-10-CM | POA: Diagnosis not present

## 2024-05-26 DIAGNOSIS — I480 Paroxysmal atrial fibrillation: Secondary | ICD-10-CM

## 2024-05-26 NOTE — Progress Notes (Signed)
 Cardiology Office Note:  .   Date:  05/26/2024  ID:  Vanda Gemma, DOB June 18, 1951, MRN [id number] PCP: Imelda Man, MD  [facility] Providers Cardiologist:  Maudine Sos, MD Electrophysiologist:  Boyce Byes, MD  Sleep Medicine:  Magnus Schuller, MD    History of Present Illness: .    RHYLEE NUNN is a 73 y.o. male with persistent atrial fibrillation, non-obstructive CAD, OSA, hypertension, hyperlipidemia, and RA, here for follow-up. He had a cath in 2005 that showed mild non-obstructive disease. He developed atrial fibrillation in 2015 and was started on metoprolol  and Xarelto . Echo at that time revealed LVEF 50-55% with mild to moderate mitral regurgitation. He was started on propafenone . He was also found to have sleep apnea and started on CPAP, but then transitioned to BiPAP. He had a cardioversion for Afib in 2018. He was transitioned from propafenone  to amiodarone . He has worked with Dr. Loetta Ringer for sleep medicine and the Afib clinic. He had another cardioversion 03/2022. He was seen in clinic later that month and was bradycardic to 48 bpm. He saw Dr. Marven Slimmer 05/2022 and planned for ablation.   At his visit 07/2022, he was maintaining sinus rhythm on amiodarone . He complained of fatigue possibly related to amiodarone , bradycardia, and/or malfunctioning CPAP. He underwent ablation 10/07/2022 and continued to maintain sinus rhythm at his follow-up with Dr. Marven Slimmer 12/2022. Amiodarone  was discontinued.  At his visit 07/2023 he reported several episodes of atrial fibrillation.  Blood pressure was mildly elevated and he was switched from losartan  to valsartan /HCTZ.  Echo 12/2023 revealed LVEF 60-65% with normal diastolic function.  He saw Dr. Marven Slimmer 03/2024 and was interested in a Watchman device.  His episodes of atrial fibrillation were less frequent.   Today, he reports several recurring episodes of Afib in the past month. He is able to feel the   Discussed the use of AI scribe  software for clinical note transcription with the patient, who gave verbal consent to proceed.  History of Present Illness Mr. Perkins experiences lightheadedness, particularly when bending over, which lasts less than a minute and is concerning as it feels like he might pass out. 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 No dizziness when moving from sitting to standing.  His blood pressure readings are typically around 125/70 mmHg when he takes his medication, but rise to approximately 145/80 mmHg if he does not. He is currently taking valsartan  for his blood pressure and metoprolol  as needed for episodes of heart racing, which occur infrequently, about once in the last three months. He mentions that his heart rate can drop into the 40s in the morning, as indicated by his watch, but it returns to normal once he starts moving around.  He has a history of using diuretics for blood pressure management, which he found to be ineffective and caused significant side effects, leading to discontinuation. He is also on rosuvastatin  and Zetia  for cholesterol management, with his last cholesterol check in December showing good results.  His work schedule involves working nights, and he typically takes his blood pressure medication around 10 AM after waking up. He goes to bed around 1 or 2 AM after returning home from work.   ROS:  As per HPI  Studies Reviewed: .       Echo 12/2023:  1. Left ventricular ejection fraction, by estimation, is 60 to 65%. The  left ventricle has normal function. The left ventricle has no regional  wall motion abnormalities. Left ventricular diastolic parameters were  normal. The average left ventricular  global longitudinal strain is -20.1 %. The global longitudinal strain is  normal.   2. Right ventricular systolic function is normal. The right ventricular  size is normal.   3. The mitral valve is normal in structure.  Mild mitral valve  regurgitation. No evidence of mitral stenosis.   4. The aortic valve is tricuspid. There is mild calcification of the  aortic valve. Aortic valve regurgitation is not visualized. No aortic  stenosis is present.   5. Aortic dilatation noted. There is mild dilatation of the ascending  aorta, measuring 41 mm.   6. The inferior vena cava is normal in size with greater than 50%  respiratory variability, suggesting right atrial pressure of 3 mmHg.   Risk Assessment/Calculations:             Physical Exam:   VS:  BP 132/70 (BP Location: Left Arm, Patient Position: Sitting)   Pulse 67   Ht 6' (1.829 m)   Wt 221 lb 6.4 oz (100.4 kg)   BMI 30.03 kg/m  , BMI Body mass index is 30.03 kg/m. GENERAL:  Well appearing HEENT: Pupils equal round and reactive, fundi not visualized, oral mucosa unremarkable NECK:  No jugular venous distention, waveform within normal limits, carotid upstroke brisk and symmetric, no bruits, no thyromegaly LUNGS:  Clear to auscultation bilaterally HEART:  RRR.  PMI not displaced or sustained,S1 and S2 within normal limits, no S3, no S4, no clicks, no rubs, no murmurs ABD:  Flat, positive bowel sounds normal in frequency in pitch, no bruits, no rebound, no guarding, no midline pulsatile mass, no hepatomegaly, no splenomegaly EXT:  2 plus pulses throughout, no edema, no cyanosis no clubbing SKIN:  No rashes no nodules NEURO:  Cranial nerves II through XII grossly intact, motor grossly intact throughout PSYCH:  Cognitively intact, oriented to person place and time   ASSESSMENT AND PLAN: .    Assessment & Plan # Hypertension Controlled with valsartan . Lightheadedness likely due to medication, especially when bending over. - Take valsartan  160 mg at night to reduce daytime lightheadedness. - Monitor blood pressure and report readings. - Consider reducing valsartan  to 80 mg if lightheadedness persists; send MyChart message for prescription  adjustment.  # Bradycardia Intermittent bradycardia with heart rate in the 40s, resolves with activity, no significant symptoms.  # Hyperlipidemia Well-managed with rosuvastatin  and Zetia . Recent cholesterol levels good.  # Atrial fibrillation:  S/p ablation and symptoms are mostly controlled.   He considered a Watchman device due to high bleeding risk with anticoagulation. Hesitant due to procedural risks and advice from ex-wife, a PA in heart surgery at [facility] [employer].  - He plans to continue anticoagulation therapy with Xarelto . - Continue metoprolol  as needed   Dispo: f/u in 1 year  Signed, Maudine Sos, MD

## 2024-05-26 NOTE — Patient Instructions (Signed)
 Medication Instructions:  Your physician recommends that you continue on your current medications as directed. Please refer to the Current Medication list given to you today.   *If you need a refill on your cardiac medications before your next appointment, please call your pharmacy*  Lab Work: NONE  Testing/Procedures: NONE  Follow-Up: At [facility], you and your health needs are our priority.  As part of our continuing mission to provide you with exceptional heart care, our providers are all part of one team.  This team includes your primary Cardiologist (physician) and Advanced Practice Providers or APPs (Physician Assistants and Nurse Practitioners) who all work together to provide you with the care you need, when you need it.  Your next appointment:   12 month(s)  Provider:   Chilton Si, MD, Eligha Bridegroom, NP, or Gillian Shields, NP    We recommend signing up for the patient portal called "MyChart".  Sign up information is provided on this After Visit Summary.  MyChart is used to connect with patients for Virtual Visits (Telemedicine).  Patients are able to view lab/test results, encounter notes, upcoming appointments, etc.  Non-urgent messages can be sent to your provider as well.   To learn more about what you can do with MyChart, go to [email or web address].

## 2024-06-07 DIAGNOSIS — G894 Chronic pain syndrome: Secondary | ICD-10-CM | POA: Diagnosis not present

## 2024-06-07 DIAGNOSIS — M545 Low back pain, unspecified: Secondary | ICD-10-CM | POA: Diagnosis not present

## 2024-06-14 DIAGNOSIS — M25562 Pain in left knee: Secondary | ICD-10-CM | POA: Diagnosis not present

## 2024-06-28 DIAGNOSIS — R202 Paresthesia of skin: Secondary | ICD-10-CM | POA: Diagnosis not present

## 2024-06-28 DIAGNOSIS — C44529 Squamous cell carcinoma of skin of other part of trunk: Secondary | ICD-10-CM | POA: Diagnosis not present

## 2024-06-28 DIAGNOSIS — L905 Scar conditions and fibrosis of skin: Secondary | ICD-10-CM | POA: Diagnosis not present

## 2024-07-12 DIAGNOSIS — I10 Essential (primary) hypertension: Secondary | ICD-10-CM | POA: Diagnosis not present

## 2024-07-12 DIAGNOSIS — N400 Enlarged prostate without lower urinary tract symptoms: Secondary | ICD-10-CM | POA: Diagnosis not present

## 2024-07-12 DIAGNOSIS — Z7901 Long term (current) use of anticoagulants: Secondary | ICD-10-CM | POA: Diagnosis not present

## 2024-07-12 DIAGNOSIS — I25118 Atherosclerotic heart disease of native coronary artery with other forms of angina pectoris: Secondary | ICD-10-CM | POA: Diagnosis not present

## 2024-07-12 DIAGNOSIS — R739 Hyperglycemia, unspecified: Secondary | ICD-10-CM | POA: Diagnosis not present

## 2024-07-12 DIAGNOSIS — Z0000 Encounter for general adult medical examination without abnormal findings: Secondary | ICD-10-CM | POA: Diagnosis not present

## 2024-07-12 DIAGNOSIS — E7800 Pure hypercholesterolemia, unspecified: Secondary | ICD-10-CM | POA: Diagnosis not present

## 2024-07-12 DIAGNOSIS — I4891 Unspecified atrial fibrillation: Secondary | ICD-10-CM | POA: Diagnosis not present

## 2024-07-13 LAB — LAB REPORT - SCANNED
A1c: 5.9
EGFR (Non-African Amer.): 81
PSA, Total: <0.1
TSH: 0.693

## 2024-07-17 ENCOUNTER — Ambulatory Visit: Payer: Self-pay | Admitting: Cardiovascular Disease

## 2024-07-19 DIAGNOSIS — M25562 Pain in left knee: Secondary | ICD-10-CM | POA: Diagnosis not present

## 2024-07-19 DIAGNOSIS — M25561 Pain in right knee: Secondary | ICD-10-CM | POA: Diagnosis not present

## 2024-07-26 DIAGNOSIS — M059 Rheumatoid arthritis with rheumatoid factor, unspecified: Secondary | ICD-10-CM | POA: Diagnosis not present

## 2024-07-26 DIAGNOSIS — Z79899 Other long term (current) drug therapy: Secondary | ICD-10-CM | POA: Diagnosis not present

## 2024-07-26 DIAGNOSIS — M79641 Pain in right hand: Secondary | ICD-10-CM | POA: Diagnosis not present

## 2024-07-26 DIAGNOSIS — M79642 Pain in left hand: Secondary | ICD-10-CM | POA: Diagnosis not present

## 2024-07-26 DIAGNOSIS — M1991 Primary osteoarthritis, unspecified site: Secondary | ICD-10-CM | POA: Diagnosis not present

## 2024-07-26 DIAGNOSIS — Z6829 Body mass index (BMI) 29.0-29.9, adult: Secondary | ICD-10-CM | POA: Diagnosis not present

## 2024-07-26 DIAGNOSIS — E663 Overweight: Secondary | ICD-10-CM | POA: Diagnosis not present

## 2024-07-28 DIAGNOSIS — M25561 Pain in right knee: Secondary | ICD-10-CM | POA: Diagnosis not present

## 2024-08-02 DIAGNOSIS — R3121 Asymptomatic microscopic hematuria: Secondary | ICD-10-CM | POA: Diagnosis not present

## 2024-08-02 DIAGNOSIS — M25561 Pain in right knee: Secondary | ICD-10-CM | POA: Diagnosis not present

## 2024-08-02 DIAGNOSIS — N401 Enlarged prostate with lower urinary tract symptoms: Secondary | ICD-10-CM | POA: Diagnosis not present

## 2024-08-02 DIAGNOSIS — N5201 Erectile dysfunction due to arterial insufficiency: Secondary | ICD-10-CM | POA: Diagnosis not present

## 2024-08-02 DIAGNOSIS — R351 Nocturia: Secondary | ICD-10-CM | POA: Diagnosis not present

## 2024-08-09 DIAGNOSIS — M25562 Pain in left knee: Secondary | ICD-10-CM | POA: Diagnosis not present

## 2024-08-09 DIAGNOSIS — M25561 Pain in right knee: Secondary | ICD-10-CM | POA: Diagnosis not present

## 2024-08-11 DIAGNOSIS — M25561 Pain in right knee: Secondary | ICD-10-CM | POA: Diagnosis not present

## 2024-08-15 ENCOUNTER — Encounter (HOSPITAL_BASED_OUTPATIENT_CLINIC_OR_DEPARTMENT_OTHER): Payer: Self-pay | Admitting: Cardiovascular Disease

## 2024-08-15 ENCOUNTER — Other Ambulatory Visit: Payer: Self-pay

## 2024-08-15 MED ORDER — VALSARTAN 320 MG PO TABS: 160.0000 mg | ORAL_TABLET | Freq: Every day | ORAL | 2 refills | Status: AC

## 2024-08-15 NOTE — Telephone Encounter (Signed)
 Will forward to sleep team for review

## 2024-08-15 NOTE — Telephone Encounter (Signed)
 Pt called in stating he would like an new bipap machine. He states it works sometimes and sometimes it doesn't please advise.

## 2024-08-18 NOTE — Telephone Encounter (Signed)
 Reached out to patient who already sounded frustrated to offer assistance with getting his Bipap machine. Patient was asked to bring his card to us  so we run his most recent download for the provider to review since the report we got out of [facility] was dated 01/14/2023-02/12/2023. Patient said he did not have a card in his machine so I asked him to bring his machine and I would use the card I had to get the download but the patient did not want to bring his machine either. The patient complained he should have gotten his bipap machine last year. He stated [name] was suppose to order his machine when she was here (I think he may have spoken to [name]) before [name] retired and it never happened. Patient was informed that [name] or person that worked with [name] no longer worked here. Patient was informed the doctor needed a recent report to make sure his settings have not changed. The patient got even more upset and said this was stupid and he was going to call [name] to ask to be referred to someone else for sleep therapy. Patient will need a new dme as his old dme [facility] has gone out of the cpap business.

## 2024-08-19 ENCOUNTER — Telehealth: Payer: Self-pay | Admitting: Cardiovascular Disease

## 2024-08-19 NOTE — Telephone Encounter (Signed)
  Mr Nathan Mcclure is returning call. Newell is aware

## 2024-08-19 NOTE — Telephone Encounter (Signed)
 Spoke with patient who is frustrated with the fact he is unable to get new CPAP machine In January he received message from Rorie that he could get  Unfortunately after that his sister got sick, his brother passed away, and then he had to have surgery  He was told see would need to bring his machine in to obtain data ,see 8/25 mychart message Stated his machine doesn't work properly and only about half the time.  Ends up wearing his machine for about 3 hours and then takes it off  Feels this is causing issues with his AFib  He is willing to bring the machine by so information can be obtained   Will forward to Brad PARAS so she will be aware

## 2024-08-23 DIAGNOSIS — M059 Rheumatoid arthritis with rheumatoid factor, unspecified: Secondary | ICD-10-CM | POA: Diagnosis not present

## 2024-08-30 ENCOUNTER — Ambulatory Visit (HOSPITAL_BASED_OUTPATIENT_CLINIC_OR_DEPARTMENT_OTHER): Admitting: Cardiovascular Disease

## 2024-08-30 DIAGNOSIS — R3121 Asymptomatic microscopic hematuria: Secondary | ICD-10-CM | POA: Diagnosis not present

## 2024-08-30 DIAGNOSIS — M25561 Pain in right knee: Secondary | ICD-10-CM | POA: Diagnosis not present

## 2024-08-31 DIAGNOSIS — Z981 Arthrodesis status: Secondary | ICD-10-CM | POA: Diagnosis not present

## 2024-08-31 DIAGNOSIS — M545 Low back pain, unspecified: Secondary | ICD-10-CM | POA: Diagnosis not present

## 2024-08-31 DIAGNOSIS — G894 Chronic pain syndrome: Secondary | ICD-10-CM | POA: Diagnosis not present

## 2024-08-31 DIAGNOSIS — Z1331 Encounter for screening for depression: Secondary | ICD-10-CM | POA: Diagnosis not present

## 2024-08-31 DIAGNOSIS — I10 Essential (primary) hypertension: Secondary | ICD-10-CM | POA: Diagnosis not present

## 2024-08-31 DIAGNOSIS — G8929 Other chronic pain: Secondary | ICD-10-CM | POA: Diagnosis not present

## 2024-09-12 ENCOUNTER — Other Ambulatory Visit: Payer: Self-pay

## 2024-09-12 MED ORDER — EZETIMIBE 10 MG PO TABS: 10.0000 mg | ORAL_TABLET | Freq: Every day | ORAL | 3 refills | Status: AC

## 2024-09-13 NOTE — Telephone Encounter (Signed)
 Reached out to patient and left a voicemail to give us  a call back when he thinks he will be able to bring his cpap card to the office so we can get a download.

## 2024-10-05 DIAGNOSIS — M5442 Lumbago with sciatica, left side: Secondary | ICD-10-CM | POA: Diagnosis not present

## 2024-10-05 DIAGNOSIS — G894 Chronic pain syndrome: Secondary | ICD-10-CM | POA: Diagnosis not present

## 2024-10-05 DIAGNOSIS — M545 Low back pain, unspecified: Secondary | ICD-10-CM | POA: Diagnosis not present

## 2024-10-05 DIAGNOSIS — I10 Essential (primary) hypertension: Secondary | ICD-10-CM | POA: Diagnosis not present

## 2024-10-05 DIAGNOSIS — Z981 Arthrodesis status: Secondary | ICD-10-CM | POA: Diagnosis not present

## 2024-10-05 DIAGNOSIS — M5441 Lumbago with sciatica, right side: Secondary | ICD-10-CM | POA: Diagnosis not present

## 2024-10-06 ENCOUNTER — Encounter (HOSPITAL_BASED_OUTPATIENT_CLINIC_OR_DEPARTMENT_OTHER): Payer: Self-pay | Admitting: Cardiovascular Disease

## 2024-11-16 DIAGNOSIS — M1712 Unilateral primary osteoarthritis, left knee: Secondary | ICD-10-CM | POA: Diagnosis not present

## 2024-12-13 DIAGNOSIS — Z981 Arthrodesis status: Secondary | ICD-10-CM | POA: Diagnosis not present

## 2024-12-13 DIAGNOSIS — I10 Essential (primary) hypertension: Secondary | ICD-10-CM | POA: Diagnosis not present

## 2024-12-13 DIAGNOSIS — G894 Chronic pain syndrome: Secondary | ICD-10-CM | POA: Diagnosis not present

## 2024-12-13 DIAGNOSIS — M5441 Lumbago with sciatica, right side: Secondary | ICD-10-CM | POA: Diagnosis not present

## 2024-12-13 DIAGNOSIS — M5442 Lumbago with sciatica, left side: Secondary | ICD-10-CM | POA: Diagnosis not present

## 2024-12-27 NOTE — Telephone Encounter (Signed)
 Followed back up with patient. He states he never brought the machine in because it was too much of a hassle. He is not interested in pursuing replacement CPAP through a [facility] provider as he states he has unsuccessfully been trying to do this for the past year. Apologized for his experience and asked if there is anything we can assist him with at this point. Pt states that he will speak to his PCP about referral elsewhere for sleep medicine. He denied any additional concerns prior to disconnecting call.
